# Patient Record
Sex: Female | Born: 1950
Health system: Southern US, Community
[De-identification: ages and names within clinical notes are randomized; demographics above are authoritative.]

## PROBLEM LIST (undated history)

## (undated) DIAGNOSIS — N951 Menopausal and female climacteric states: Secondary | ICD-10-CM

## (undated) DIAGNOSIS — K219 Gastro-esophageal reflux disease without esophagitis: Secondary | ICD-10-CM

## (undated) DIAGNOSIS — H269 Unspecified cataract: Secondary | ICD-10-CM

## (undated) DIAGNOSIS — C439 Malignant melanoma of skin, unspecified: Secondary | ICD-10-CM

## (undated) DIAGNOSIS — R42 Dizziness and giddiness: Secondary | ICD-10-CM

## (undated) DIAGNOSIS — M545 Low back pain, unspecified: Secondary | ICD-10-CM

## (undated) DIAGNOSIS — M1990 Unspecified osteoarthritis, unspecified site: Secondary | ICD-10-CM

## (undated) DIAGNOSIS — J45909 Unspecified asthma, uncomplicated: Secondary | ICD-10-CM

## (undated) DIAGNOSIS — I10 Essential (primary) hypertension: Secondary | ICD-10-CM

## (undated) HISTORY — PX: CHOLECYSTECTOMY: SHX55

## (undated) HISTORY — DX: Essential (primary) hypertension: I10

## (undated) HISTORY — DX: Low back pain, unspecified: M54.50

## (undated) HISTORY — DX: Gastro-esophageal reflux disease without esophagitis: K21.9

## (undated) HISTORY — DX: Unspecified osteoarthritis, unspecified site: M19.90

## (undated) HISTORY — DX: Unspecified cataract: H26.9

## (undated) HISTORY — DX: Low back pain: M54.5

## (undated) HISTORY — PX: TONSILLECTOMY: SUR1361

## (undated) HISTORY — DX: Menopausal and female climacteric states: N95.1

## (undated) HISTORY — PX: BREAST EXCISIONAL BIOPSY: SUR124

## (undated) HISTORY — PX: TUBAL LIGATION: SHX77

## (undated) HISTORY — PX: DILATION AND CURETTAGE OF UTERUS: SHX78

## (undated) HISTORY — DX: Dizziness and giddiness: R42

## (undated) HISTORY — DX: Malignant melanoma of skin, unspecified: C43.9

## (undated) HISTORY — DX: Unspecified asthma, uncomplicated: J45.909

---

## 1999-05-08 ENCOUNTER — Encounter (INDEPENDENT_AMBULATORY_CARE_PROVIDER_SITE_OTHER): Payer: Self-pay | Admitting: Specialist

## 1999-05-08 ENCOUNTER — Encounter: Payer: Self-pay | Admitting: *Deleted

## 1999-05-08 ENCOUNTER — Observation Stay (HOSPITAL_COMMUNITY): Admission: RE | Admit: 1999-05-08 | Discharge: 1999-05-09 | Payer: Self-pay | Admitting: *Deleted

## 1999-11-13 ENCOUNTER — Encounter (INDEPENDENT_AMBULATORY_CARE_PROVIDER_SITE_OTHER): Payer: Self-pay

## 1999-11-13 ENCOUNTER — Other Ambulatory Visit: Admission: RE | Admit: 1999-11-13 | Discharge: 1999-11-13 | Payer: Self-pay | Admitting: Obstetrics and Gynecology

## 2001-04-24 ENCOUNTER — Ambulatory Visit (HOSPITAL_COMMUNITY): Admission: RE | Admit: 2001-04-24 | Discharge: 2001-04-24 | Payer: Self-pay | Admitting: *Deleted

## 2006-04-12 ENCOUNTER — Ambulatory Visit: Payer: Self-pay | Admitting: Internal Medicine

## 2007-05-13 DIAGNOSIS — M545 Low back pain: Secondary | ICD-10-CM | POA: Insufficient documentation

## 2008-12-02 LAB — CONVERTED CEMR LAB: Pap Smear: NORMAL

## 2009-10-04 HISTORY — PX: COLONOSCOPY: SHX174

## 2009-11-04 ENCOUNTER — Encounter: Payer: Self-pay | Admitting: Internal Medicine

## 2009-11-04 LAB — CONVERTED CEMR LAB: Pap Smear: NORMAL

## 2010-06-02 ENCOUNTER — Ambulatory Visit: Payer: Self-pay | Admitting: Internal Medicine

## 2010-06-02 DIAGNOSIS — R42 Dizziness and giddiness: Secondary | ICD-10-CM

## 2010-06-02 DIAGNOSIS — N951 Menopausal and female climacteric states: Secondary | ICD-10-CM

## 2010-06-04 ENCOUNTER — Encounter (INDEPENDENT_AMBULATORY_CARE_PROVIDER_SITE_OTHER): Payer: Self-pay | Admitting: *Deleted

## 2010-06-10 ENCOUNTER — Ambulatory Visit: Payer: Self-pay | Admitting: Internal Medicine

## 2010-06-10 LAB — CONVERTED CEMR LAB
ALT: 21 units/L (ref 0–35)
AST: 22 units/L (ref 0–37)
Albumin: 4.1 g/dL (ref 3.5–5.2)
Alkaline Phosphatase: 84 units/L (ref 39–117)
BUN: 14 mg/dL (ref 6–23)
Basophils Absolute: 0 10*3/uL (ref 0.0–0.1)
Basophils Relative: 0.9 % (ref 0.0–3.0)
Bilirubin Urine: NEGATIVE
Bilirubin, Direct: 0.1 mg/dL (ref 0.0–0.3)
CO2: 30 meq/L (ref 19–32)
Calcium: 9.1 mg/dL (ref 8.4–10.5)
Chloride: 102 meq/L (ref 96–112)
Cholesterol: 199 mg/dL (ref 0–200)
Cortisol, Plasma: 6.9 ug/dL
Creatinine, Ser: 0.7 mg/dL (ref 0.4–1.2)
Eosinophils Absolute: 0.2 10*3/uL (ref 0.0–0.7)
Eosinophils Relative: 4.3 % (ref 0.0–5.0)
GFR calc non Af Amer: 92.01 mL/min (ref >60)
Glucose, Bld: 80 mg/dL (ref 70–99)
HCT: 36.4 % (ref 36.0–46.0)
HDL: 37.5 mg/dL — ABNORMAL LOW (ref >39.00)
Hemoglobin, Urine: NEGATIVE
Hemoglobin: 12.7 g/dL (ref 12.0–15.0)
Ketones, ur: NEGATIVE mg/dL
LDL Cholesterol: 138 mg/dL — ABNORMAL HIGH (ref 0–99)
Leukocytes, UA: NEGATIVE
Lymphocytes Relative: 30 % (ref 12.0–46.0)
Lymphs Abs: 1.4 10*3/uL (ref 0.7–4.0)
MCHC: 35 g/dL (ref 30.0–36.0)
MCV: 89.3 fL (ref 78.0–100.0)
Monocytes Absolute: 0.3 10*3/uL (ref 0.1–1.0)
Monocytes Relative: 6.1 % (ref 3.0–12.0)
Neutro Abs: 2.7 10*3/uL (ref 1.4–7.7)
Neutrophils Relative %: 58.7 % (ref 43.0–77.0)
Nitrite: NEGATIVE
Platelets: 229 10*3/uL (ref 150.0–400.0)
Potassium: 4.6 meq/L (ref 3.5–5.1)
RBC: 4.08 M/uL (ref 3.87–5.11)
RDW: 13.1 % (ref 11.5–14.6)
Sodium: 140 meq/L (ref 135–145)
Specific Gravity, Urine: 1.015 (ref 1.000–1.030)
TSH: 2.05 microintl units/mL (ref 0.35–5.50)
Total Bilirubin: 0.6 mg/dL (ref 0.3–1.2)
Total CHOL/HDL Ratio: 5
Total Protein, Urine: NEGATIVE mg/dL
Total Protein: 6.6 g/dL (ref 6.0–8.3)
Triglycerides: 120 mg/dL (ref 0.0–149.0)
Urine Glucose: NEGATIVE mg/dL
Urobilinogen, UA: 0.2 (ref 0.0–1.0)
VLDL: 24 mg/dL (ref 0.0–40.0)
Vit D, 25-Hydroxy: 41 ng/mL (ref 30–89)
Vitamin B-12: 616 pg/mL (ref 211–911)
WBC: 4.6 10*3/uL (ref 4.5–10.5)
pH: 5.5 (ref 5.0–8.0)

## 2010-06-17 ENCOUNTER — Encounter: Payer: Self-pay | Admitting: Internal Medicine

## 2010-06-17 ENCOUNTER — Ambulatory Visit: Payer: Self-pay | Admitting: Internal Medicine

## 2010-07-13 ENCOUNTER — Encounter (INDEPENDENT_AMBULATORY_CARE_PROVIDER_SITE_OTHER): Payer: Self-pay

## 2010-07-14 ENCOUNTER — Ambulatory Visit: Payer: Self-pay | Admitting: Internal Medicine

## 2010-07-28 ENCOUNTER — Ambulatory Visit: Payer: Self-pay | Admitting: Internal Medicine

## 2010-07-28 LAB — HM COLONOSCOPY

## 2010-07-29 ENCOUNTER — Encounter: Payer: Self-pay | Admitting: Internal Medicine

## 2010-11-05 NOTE — Assessment & Plan Note (Signed)
Summary: last ov 2007/f/u appt/#/cd   Vital Signs:  Patient profile:   60 year old female Weight:      150 pounds (68.18 kg) O2 Sat:      97 % on Room air Temp:     98.3 degrees F (36.83 degrees C) oral Pulse rate:   84 / minute BP supine:   120 / 70  (left arm) BP sitting:   104 / 70  (left arm) BP standing:   110 / 68  (left arm) Cuff size:   regular  Vitals Entered By: Lucious Groves CMA (June 02, 2010 10:32 AM)  O2 Flow:  Room air CC: OV--last seen in 2007./kb Is Patient Diabetic? No Pain Assessment Patient in pain? no      Comments Patient notes that she takes medication for GERD, but did not know the name./kb   CC:  OV--last seen in 2007./kb.  History of Present Illness: The patient presents for a preventive health examination (new)    Preventive Screening-Counseling & Management  Alcohol-Tobacco     Smoking Status: quit  Caffeine-Diet-Exercise     Does Patient Exercise: no  Current Medications (verified): 1)  Lexapro 10 Mg  Tabs (Escitalopram Oxalate) .... 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 Former Smoker Alcohol use-yes Regular exercise-no Does Patient Exercise:  no  Review of Systems  The patient denies anorexia, fever, weight loss, weight gain, vision loss, decreased hearing, hoarseness, chest pain, syncope, dyspnea on exertion, peripheral edema, prolonged cough, headaches, hemoptysis, abdominal pain, melena, hematochezia, severe indigestion/heartburn, hematuria, incontinence, genital sores, muscle weakness, suspicious  skin lesions, transient blindness, difficulty walking, depression, unusual weight change, abnormal bleeding, enlarged lymph nodes, angioedema, and breast masses.         occasional LBP  Physical Exam  General:  Well-developed,well-nourished,in no acute distress; alert,appropriate and cooperative throughout examination Head:  Normocephalic and atraumatic without obvious abnormalities. No apparent alopecia or balding. Eyes:  No corneal or conjunctival inflammation noted. EOMI. Perrla.  Ears:  External ear exam shows no significant lesions or deformities.  Otoscopic examination reveals clear canals, tympanic membranes are intact bilaterally without bulging, retraction, inflammation or discharge. Hearing is grossly normal bilaterally. Nose:  External nasal examination shows no deformity or inflammation. Nasal mucosa are pink and moist without lesions or exudates. Mouth:  Oral mucosa and oropharynx without lesions or exudates.  Teeth in good repair. Neck:  No deformities, masses, or tenderness noted. Lungs:  Normal respiratory effort, chest expands symmetrically. Lungs are clear to auscultation, no crackles or wheezes. Heart:  Normal rate and regular rhythm. S1 and S2 normal without gallop, murmur, click, rub or other extra sounds. Abdomen:  Bowel sounds positive,abdomen soft and non-tender without masses, organomegaly or hernias noted. Msk:  No deformity or scoliosis noted of thoracic or lumbar spine.   Pulses:  R and L carotid,radial,femoral,dorsalis pedis and posterior tibial pulses are full and equal bilaterally Neurologic:  ataxic Skin:  Intact without suspicious lesions or rashes Cervical Nodes:  No lymphadenopathy noted Inguinal Nodes:  No significant adenopathy Psych:  Cognition and judgment appear intact. Alert and cooperative with normal  attention span and concentration. No apparent delusions, illusions, hallucinations   Impression & Recommendations:  Problem # 1:  Preventive Health  Care (ICD-V70.0) PAP 2010 mammo is pending  Health and age related issues were discussed. Available screening tests and vaccinations were discussed as well. Healthy life style including good diet and exercise was discussed.  Labs ordered Needs Colon, BDS  Problem # 2:  LOW BACK PAIN (ICD-724.2) MSK Assessment: Comment Only Mild symptoms Use OTC meds  Complete Medication List: 1)  Lexapro 10 Mg Tabs (Escitalopram oxalate) .... 1/2 once daily 2)  Vitamin D 1000 Unit Tabs (Cholecalciferol) .[name].. 1 by mouth qd  Other Orders: T-Bone Densitometry [phone]) Gastroenterology Referral (GI) Admin 1st Vaccine (60454) Flu Vaccine 16yrs + [phone])  Patient Instructions: 1)  Labs tomorrow v70.0 2)  BMP prior to visit, ICD-9: 3)  Hepatic Panel prior to visit, ICD-9: 4)  Lipid Panel prior to visit, ICD-9: 5)  TSH prior to visit, ICD-9: 6)  CBC w/ Diff prior to visit, ICD-9: 7)  Urine-dip prior to visit, ICD-9: 8)  Vit D 724.20 9)  Vit B12 780.79 10)  cortisol 11)  Please schedule a follow-up appointment in 3 months. 12)  Drink more water 13)  Balance exercises  Preventive Care Screening  Last Tetanus Booster:    Date:  06/04/2009    Results:  Historical   Pap Smear:    Date:  12/02/2008    Results:  normal            Flu Vaccine Consent Questions     Do you have a history of severe allergic reactions to this vaccine? no    Any prior history of allergic reactions to egg and/or gelatin? no    Do you have a sensitivity to the preservative Thimersol? no    Do you have a past history of Guillan-Barre Syndrome? no    Do you currently have an acute febrile illness? no    Have you ever had a severe reaction to latex? no    Vaccine information given and explained to patient? yes    Are you currently pregnant? no    Lot Number:[id number]   Exp Date:04/03/2011   Site Given  Left Deltoid IMlu

## 2010-11-05 NOTE — Procedures (Signed)
Summary: Colonoscopy  Patient: [name] Note: All result statuses are Final unless otherwise noted.  Tests: (1) Colonoscopy (COL)   COL Colonoscopy           DONE     [facility]     [address]     [place], [place]  [address]           COLONOSCOPY PROCEDURE REPORT           PATIENT:  Tina Bird, Tina Bird  MR#:  [id number]     BIRTHDATE:  [birth year], 58 yrs. old  GENDER:  female     ENDOSCOPIST:  Iva Boop, MD, [facility]     REF. BY:  Linda Hedges. Plotnikov, M.D.     PROCEDURE DATE:  07/28/2010     PROCEDURE:  Colonoscopy with snare polypectomy     ASA CLASS:  Class II     INDICATIONS:  Routine Risk Screening     MEDICATIONS:   Fentanyl 100 mcg IV, Versed 7 mg IV           DESCRIPTION OF PROCEDURE:   After the risks benefits and     alternatives of the procedure were thoroughly explained, informed     consent was obtained.  Digital rectal exam was performed and     revealed perianal skin tags and no rectal masses.  Small tags. The     LB [id number] endoscope was introduced through the anus and     advanced to the cecum, which was identified by both the appendix     and ileocecal valve, limited by stenosis.  Stenotic sigmoid, due     to diverticulosis.  The quality of the prep was excellent, using     MoviPrep.  The instrument was then slowly withdrawn as the colon     was fully examined. Insertion: 11:11 minutes Withdrawal: 11:20     minutes     <<PROCEDUREIMAGES>>           FINDINGS:  Two polyps were found in the sigmoid colon. They were     diminutive. Polyps were snared without cautery. Retrieval was     successful.  Severe diverticulosis was found in the sigmoid colon.     Angulation and luminal stenosis - severe.  This was otherwise a     normal examination of the colon.   Retroflexed views in the rectum     revealed internal and external hemorrhoids.    The scope was then     withdrawn from the patient and the procedure completed.           COMPLICATIONS:   None     ENDOSCOPIC IMPRESSION:     1) Two diminutive sigmoid polyps removed     2) Severe sigmoid diverticulosis with luminal stenosis     3) Internal and external hemorrhoids - small     4) Otherwise normal with excellent prep     RECOMMENDATIONS:     Would see a surgeon re: symptomatic hemorrhoids, if desired.     [facility])           REPEAT EXAM:  In for Colonoscopy, pending biopsy results. Use     pediatric scope next exam.           Iva Boop, MD, [facility]           CC:  Linda Hedges. Plotnikov, MD     The Patient  n.     eSIGNED:   Iva Boop at 07/28/2010 12:07 PM           Lynelle Doctor, [phone]  Note: An exclamation mark (!) indicates a result that was not dispersed into the flowsheet. Document Creation Date: 07/28/2010 12:08 PM _______________________________________________________________________  (1) Order result status: Final Collection or observation date-time: 07/28/2010 11:58 Requested date-time:  Receipt date-time:  Reported date-time:  Referring Physician:   Ordering Physician: Stan Head [phone]) Specimen Source:  Source: Launa Grill Order Number: [id number] Lab site:   Appended Document: Colonoscopy   Colonoscopy  Procedure date:  07/28/2010  Findings:       1) Two diminutive sigmoid polyps removed HYPERPLASTIC     2) Severe sigmoid diverticulosis with luminal stenosis     3) Internal and external hemorrhoids - small     4) Otherwise normal with excellent prep  Comments:      Repeat colonoscopy in 10 years.    Procedures Next Due Date:    Colonoscopy: 08/2020   Appended Document: Colonoscopy     Procedures Next Due Date:    Colonoscopy: 08/2020

## 2010-11-05 NOTE — Letter (Signed)
Summary: Pre Visit Letter Revised  [facility]  [address] [place], [place] [address]   Phone: [phone]  Fax: [phone]        06/04/2010 MRN: [id number] Tina Bird [address] [place], [place]  [address]             Procedure Date:  07-28-10   Welcome to the Gastroenterology Division at [facility].    You are scheduled to see a nurse for your pre-procedure visit on 07-14-10 at 10:30a.m. on the 3rd floor at [facility], [address]  We ask that you try to arrive at our office 15 minutes prior to your appointment time to allow for check-in.  Please take a minute to review the attached form.  If you answer "Yes" to one or more of the questions on the first page, we ask that you call the person listed at your earliest opportunity.  If you answer "No" to all of the questions, please complete the rest of the form and bring it to your appointment.    Your nurse visit will consist of discussing your medical and surgical history, your immediate family medical history, and your medications.   If you are unable to list all of your medications on the form, please bring the medication bottles to your appointment and we will list them.  We will need to be aware of both prescribed and over the counter drugs.  We will need to know exact dosage information as well.    Please be prepared to read and sign documents such as consent forms, a financial agreement, and acknowledgement forms.  If necessary, and with your consent, a friend or relative is welcome to sit-in on the nurse visit with you.  Please bring your insurance card so that we may make a copy of it.  If your insurance requires a referral to see a specialist, please bring your referral form from your primary care physician.  No co-pay is required for this nurse visit.     If you cannot keep your appointment, please call [phone] to cancel or reschedule prior to your appointment date.  This allows  [place] the opportunity to schedule an appointment for another patient in need of care.    Thank you for choosing [facility] for your medical needs.  We appreciate the opportunity to care for you.  Please visit [place] at our website  to learn more about our practice.  Sincerely, The Gastroenterology Division

## 2010-11-05 NOTE — Miscellaneous (Signed)
Summary: BONE DENSITY  Clinical Lists Changes  Orders: Added new Test order of T-Lumbar Vertebral Assessment (77082) - Signed 

## 2010-11-05 NOTE — Miscellaneous (Signed)
Summary: Lec previsit  Clinical Lists Changes  Medications: Added new medication of MOVIPREP 100 GM  SOLR (PEG-KCL-NACL-NASULF-NA ASC-C) As per prep instructions. - Signed Rx of MOVIPREP 100 GM  SOLR (PEG-KCL-NACL-NASULF-NA ASC-C) As per prep instructions.;  #1 x 0;  Signed;  Entered by: Ulis Rias RN;  Authorized by: Iva Boop MD, FACG;  Method used: Electronically to [facility] [address] [id number]*, [address] [place], [place], [place]  [address], Ph: [phone] or [phone], Fax: [phone] Observations: Added new observation of NKA: T (07/14/2010 10:26)    Prescriptions: MOVIPREP 100 GM  SOLR (PEG-KCL-NACL-NASULF-NA ASC-C) As per prep instructions.  #1 x 0   Entered by:   Ulis Rias RN   Authorized by:   Iva Boop MD, [facility]   Signed by:   Ulis Rias RN on 07/14/2010   Method used:   Electronically to        [facility] [address] [id number] (retail)       [address]       [place], [place]  [address]       Ph: [phone] or [phone]       Fax: [phone]   RxID:   [id number]

## 2010-11-05 NOTE — Letter (Signed)
Summary: [facility] Instructions  [facility]  [address] [place], [place] [address]   Phone: [phone]  Fax: [phone]       ADREAM PARZYCH    1951/07/20    MRN: [id number]        Procedure Day /Date: Tuesday 07-28-10     Arrival Time: 9:30 am     Procedure Time: 10:30 am     Location of Procedure:                     [facility] (4th Floor)                        PREPARATION FOR COLONOSCOPY WITH MOVIPREP   Starting 5 days prior to your procedure  07-23-10 do not eat nuts, seeds, popcorn, corn, beans, peas,  salads, or any raw vegetables.  Do not take any fiber supplements (e.g. Metamucil, Citrucel, and Benefiber).  THE DAY BEFORE YOUR PROCEDURE         DATE:  07-27-10   DAY:  Monday  1.  Drink clear liquids the entire day-NO SOLID FOOD  2.  Do not drink anything colored red or purple.  Avoid juices with pulp.  No orange juice.  3.  Drink at least 64 oz. (8 glasses) of fluid/clear liquids during the day to prevent dehydration and help the prep work efficiently.  CLEAR LIQUIDS INCLUDE: Water Jello Ice Popsicles Tea (sugar ok, no milk/cream) Powdered fruit flavored drinks Coffee (sugar ok, no milk/cream) Gatorade Juice: apple, white grape, white cranberry  Lemonade Clear bullion, consomm, broth Carbonated beverages (any kind) Strained chicken noodle soup Hard Candy                             4.  In the morning, mix first dose of MoviPrep solution:    Empty 1 Pouch A and 1 Pouch B into the disposable container    Add lukewarm drinking water to the top line of the container. Mix to dissolve    Refrigerate (mixed solution should be used within 24 hrs)  5.  Begin drinking the prep at 5:00 p.m. The MoviPrep container is divided by 4 marks.   Every 15 minutes drink the solution down to the next mark (approximately 8 oz) until the full liter is complete.   6.  Follow completed prep with 16 oz of clear liquid of your choice (Nothing  red or purple).  Continue to drink clear liquids until bedtime.  7.  Before going to bed, mix second dose of MoviPrep solution:    Empty 1 Pouch A and 1 Pouch B into the disposable container    Add lukewarm drinking water to the top line of the container. Mix to dissolve    Refrigerate  THE DAY OF YOUR PROCEDURE      DATE:  07-28-10  DAY:  Tuesday  Beginning at  5:30 a.m. (5 hours before procedure):         1. Every 15 minutes, drink the solution down to the next mark (approx 8 oz) until the full liter is complete.  2. Follow completed prep with 16 oz. of clear liquid of your choice.    3. You may drink clear liquids until  8:30 a.m.  (2 HOURS BEFORE PROCEDURE).   MEDICATION INSTRUCTIONS  Unless otherwise instructed, you should take regular prescription medications with a small sip of water  as early as possible the morning of your procedure.        OTHER INSTRUCTIONS  You will need a responsible adult at least 62 years of age to accompany you and drive you home.   This person must remain in the waiting room during your procedure.  Wear loose fitting clothing that is easily removed.  Leave jewelry and other valuables at home.  However, you may wish to bring a book to read or  an iPod/MP3 player to listen to music as you wait for your procedure to start.  Remove all body piercing jewelry and leave at home.  Total time from sign-in until discharge is approximately 2-3 hours.  You should go home directly after your procedure and rest.  You can resume normal activities the  day after your procedure.  The day of your procedure you should not:   Drive   Make legal decisions   Operate machinery   Drink alcohol   Return to work  You will receive specific instructions about eating, activities and medications before you leave.    The above instructions have been reviewed and explained to me by   Ulis Rias RN  July 14, 2010 11:05 AM     I fully  understand and can verbalize these instructions _____________________________ Date _________

## 2010-11-05 NOTE — Letter (Signed)
Summary: Patient Notice-Hyperplastic Polyps  [facility]  [address] [place], [place] [address]   Phone: [phone]  Fax: [phone]        July 29, 2010 MRN: [id number]    MALAYJA FREUND [address] [place], [place]  [address]    Dear Ms. Delford Field,  I am pleased to inform you that the colon polyp(s) removed during your recent colonoscopy was (were) found to be hyperplastic.  These types of polyps are NOT pre-cancerous.  It is therefore my recommendation that you have a repeat colonoscopy examination in 10 years for routine colorectal cancer screening.  Should you develop new or worsening symptoms of abdominal pain, bowel habit changes or bleeding from the rectum or bowels, please schedule an evaluation with either your primary care physician or with me.   Please call us if you are having persistent problems or have questions about your condition that have not been fully answered at this time.  Sincerely,  Iva Boop MD, [facility] This letter has been electronically signed by your physician.  Appended Document: Patient Notice-Hyperplastic Polyps letter mailed 10.28.11

## 2010-11-17 ENCOUNTER — Telehealth: Payer: Self-pay | Admitting: Internal Medicine

## 2010-11-20 ENCOUNTER — Encounter: Payer: Self-pay | Admitting: Internal Medicine

## 2010-11-20 ENCOUNTER — Ambulatory Visit (INDEPENDENT_AMBULATORY_CARE_PROVIDER_SITE_OTHER): Payer: 59 | Admitting: Internal Medicine

## 2010-11-20 DIAGNOSIS — R42 Dizziness and giddiness: Secondary | ICD-10-CM

## 2010-11-25 NOTE — Progress Notes (Signed)
Summary: ENT  Phone Note Call from Patient Call back at Home Phone [phone]   Summary of Call: Patient is requesting reccomendation from Dr Posey Rea for ENT. She is c/o vertigo.  Initial call taken by: Lamar Sprinkles, CMA,  November 17, 2010 1:17 PM  Follow-up for Phone Call        Dr Pollyann Kennedy Dx vertigo Follow-up by: Tresa Garter MD,  November 17, 2010 2:08 PM  Additional Follow-up for Phone Call Additional follow up Details #1::        left detailed vm for pt Additional Follow-up by: Lamar Sprinkles, CMA,  November 17, 2010 3:16 PM

## 2010-12-01 NOTE — Assessment & Plan Note (Signed)
Summary: ??inner ear infection---plot pt   Vital Signs:  Patient profile:   60 year old female Height:      66 inches Weight:      150.25 pounds BMI:     24.34 O2 Sat:      99 % on Room air Temp:     98.0 degrees F oral Pulse rate:   78 / minute Resp:     16 per minute BP sitting:   120 / 82  (left arm)  Vitals Entered By: Burnard Leigh CMA(AAMA) (November 20, 2010 12:12 PM)  O2 Flow:  Room air CC: Pt c/o excessive vertigo, nausea & vomiting, sinus pressure and pain in Left ear x1 week/sls,cma Is Patient Diabetic? No   Primary Care Provider:  Tresa Garter Bird  CC:  Pt c/o excessive vertigo, nausea & vomiting, sinus pressure and pain in Left ear x1 week/sls, and cma.  History of Present Illness: Tina Bird presents for an exacerbation of "vertigo."  She reports that she has had a problem with intermittent dysquilibrium for years. Her symptoms will wax and wane but are fairly constant. She reports that she has never had a neuro consult or neuro-imaging. She has no focal neurologic symptoms. On detailed questioning she does admit to a sense of being off balance, like being on a ship in high seas. She denies diploplia, paresthesias, weakness. she has no hearing loss, no history of trauma/concussion.  Current Medications (verified): 1)  Lexapro 10 Mg  Tabs (Escitalopram Oxalate) .... 1/2 Once Daily 2)  Vitamin D 1000 Unit Tabs (Cholecalciferol) .[name].. 1 By Mouth Qd 3)  Sudafed 30 Mg Tabs (Pseudoephedrine Hcl) .... 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   Neuro:  Complains of poor balance and sensation of room spinning; denies brief paralysis, difficulty with concentration, disturbances in coordination, falling down, headaches, inability to speak, memory loss, numbness, seizures, tingling, tremors, visual disturbances, and weakness.  Physical Exam  General:  Well-developed,well-nourished,in no acute distress; alert,appropriate and cooperative throughout examination Head:  normocephalic and atraumatic.   Eyes:  vision grossly intact, pupils equal, and pupils round.   Neck:  supple.   Lungs:  normal respiratory effort.   Heart:  normal rate and regular rhythm.   Msk:  normal ROM.   Pulses:  2+ radial Neurologic:  alert & oriented X3, cranial nerves II-XII intact, strength normal in all extremities, gait normal, DTRs symmetrical and normal, finger-to-nose normal, heel-to-shin normal, and Romberg negative.   Skin:  turgor normal and color normal.   Psych:  Oriented X3, memory intact for recent and remote, and normally interactive.     Impression & Recommendations:  Problem # 1:  DIZZINESS (ICD-780.4) By history and based on a negative neuro exam her symptoms are c/w chronic labyrinthitis. Explanation provided illustrated with the use of anatomy text.  Plan- chronic use meclizine 12.5 mg two times a day with increase as needed based on symptoms.          for lack of response or worsening symptoms neuro-imagin should be considered.  Her updated medication list for this problem includes:    Meclizine Hcl 25 Mg Tabs (Meclizine hcl) .[name]... 1/2 tsbliet two times a day routinely. increase to 1 tablet  three times a day as needed acute flares of labyrinthitis.  Complete Medication List: 1)  Lexapro 10 Mg Tabs (Escitalopram oxalate) .... 1/2 once daily 2)  Vitamin D 1000 Unit Tabs (Cholecalciferol) .[name].. 1 by mouth qd 3)  Sudafed 30 Mg Tabs (Pseudoephedrine hcl) .... Take 1 as needed 4)  Meclizine Hcl 25 Mg Tabs (Meclizine hcl) .... 1/2 tsbliet two times a day routinely. increase to 1 tablet three times a day as needed acute flares of labyrinthitis. Prescriptions: MECLIZINE HCL 25 MG TABS (MECLIZINE HCL) 1/2 tsbliet two times a day routinely. Increase to 1 tablet three times a day as needed acute flares of labyrinthitis.  #100 x 3   Entered and Authorized by:   Jacques Navy Bird   Signed by:   Jacques Navy Bird on 11/20/2010   Method used:   Print then Give to Patient   RxID:   [id number]    Orders Added: 1)  Est. Patient Level III [76160]     Preventive Care Screening  Pap Smear:    Date:  11/04/2009    Results:  normal

## 2010-12-02 ENCOUNTER — Encounter: Payer: Self-pay | Admitting: Internal Medicine

## 2010-12-02 ENCOUNTER — Ambulatory Visit (INDEPENDENT_AMBULATORY_CARE_PROVIDER_SITE_OTHER)
Admission: RE | Admit: 2010-12-02 | Discharge: 2010-12-02 | Disposition: A | Discharge status: Home / Self Care | Payer: 59 | Source: Ambulatory Visit | Attending: Internal Medicine | Admitting: Internal Medicine

## 2010-12-02 ENCOUNTER — Ambulatory Visit (INDEPENDENT_AMBULATORY_CARE_PROVIDER_SITE_OTHER): Payer: 59 | Admitting: Internal Medicine

## 2010-12-02 ENCOUNTER — Other Ambulatory Visit: Payer: Self-pay | Admitting: Internal Medicine

## 2010-12-02 DIAGNOSIS — M25559 Pain in unspecified hip: Secondary | ICD-10-CM | POA: Insufficient documentation

## 2010-12-02 DIAGNOSIS — R42 Dizziness and giddiness: Secondary | ICD-10-CM

## 2010-12-02 DIAGNOSIS — M545 Low back pain: Secondary | ICD-10-CM

## 2010-12-07 ENCOUNTER — Telehealth: Payer: Self-pay | Admitting: Internal Medicine

## 2010-12-15 NOTE — Assessment & Plan Note (Signed)
Summary: f/u appt   Vital Signs:  Patient profile:   60 year old female Height:      66 inches Weight:      150 pounds BMI:     24.30 Temp:     98.9 degrees F oral Pulse rate:   88 / minute Pulse rhythm:   regular Resp:     16 per minute BP sitting:   118 / 76  (left arm) Cuff size:   regular  Vitals Entered By: Lanier Prude, CMA(AAMA) (December 02, 2010 3:01 PM) CC: f/u  Is Patient Diabetic? No   Primary Care Provider:  Tresa Garter MD  CC:  f/u .  History of Present Illness: C/o LBP and B hip pains/leg pains x long time, every day. F/u depression/anxiety  Current Medications (verified): 1)  Lexapro 10 Mg  Tabs (Escitalopram Oxalate) .... 1/2 Once Daily 2)  Vitamin D 1000 Unit Tabs (Cholecalciferol) .[name].. 1 By Mouth Qd 3)  Sudafed 30 Mg Tabs (Pseudoephedrine Hcl) .... Take 1 As Needed 4)  Meclizine Hcl 25 Mg Tabs (Meclizine Hcl) .... 1/2 Tsbliet Two Times A Day Routinely. Increase To 1 Tablet Three Times A Day As Needed Acute Flares of Labyrinthitis.  Allergies (verified): No Known Drug Allergies  Past History:  Past Medical History: Last updated: 06/02/2010 Low back pain VERTIGO MEOPAUSAL SWINGS  Dr Elana Alm MELANOMA  RIGHT LEG AND RIGHT BREAST  Social History: Last updated: 06/02/2010 Occupation: Control and instrumentation engineer Married 1 child Former Smoker Alcohol use-yes Regular exercise-no  Physical Exam  General:  Well-developed,well-nourished,in no acute distress; alert,appropriate and cooperative throughout examination Mouth:  Oral mucosa and oropharynx without lesions or exudates.  Teeth in good repair. Neck:  supple.   Lungs:  normal respiratory effort.   Heart:  normal rate and regular rhythm.   Abdomen:  Bowel sounds positive,abdomen soft and non-tender without masses, organomegaly or hernias noted. Msk:  LS w/normal ROM.   Neurologic:  alert & oriented X3, cranial nerves II-XII intact, strength normal in all extremities, gait normal, DTRs  symmetrical and normal, finger-to-nose normal, heel-to-shin normal, and Romberg negative.   Skin:  turgor normal and color normal.   Psych:  Oriented X3, memory intact for recent and remote, and normally interactive.     Impression & Recommendations:  Problem # 1:  LOW BACK PAIN (ICD-724.2) MSK Assessment Deteriorated See "Patient Instructions".  Her updated medication list for this problem includes:    Ibuprofen 600 Mg Tabs (Ibuprofen) .[name]... 1 by mouth bid  pc x 1 wk then as needed for  pain  Problem # 2:  HIP PAIN (ICD-719.45) B troch bursitis Assessment: New  Her updated medication list for this problem includes:    Ibuprofen 600 Mg Tabs (Ibuprofen) .[name]... 1 by mouth bid  pc x 1 wk then as needed for  pain We can inject if needed w/seroid Orders: T-Pelvis 1or 2 views (72170TC)  Problem # 3:  DIZZINESS (ICD-780.4) due to BPV Assessment: New  Her updated medication list for this problem includes:    Meclizine Hcl 25 Mg Tabs (Meclizine hcl) .[name]... 1/2 tsbliet two times a day routinely. increase to 1 tablet three times a day as needed acute flares of labyrinthitis.  Complete Medication List: 1)  Lexapro 10 Mg Tabs (Escitalopram oxalate) .... 1/2 once daily 2)  Vitamin D 1000 Unit Tabs (Cholecalciferol) .[name].. 1 by mouth qd 3)  Sudafed 30 Mg Tabs (Pseudoephedrine hcl) .... Take 1 as needed 4)  Meclizine Hcl 25 Mg Tabs (  Meclizine hcl) .... 1/2 tsbliet two times a day routinely. increase to 1 tablet three times a day as needed acute flares of labyrinthitis. 5)  Ibuprofen 600 Mg Tabs (Ibuprofen) .[name].. 1 by mouth bid  pc x 1 wk then as needed for  pain  Patient Instructions: 1)  Use stretching  exercises that I have provided (15 min. or longer every day)  2)  Go on Youtube (www.youtube.com) and look up "piriformis stretch",  "IT band stretch" and "gluteus stretch". Learn the anatomy and learn the symptoms. Do the stretches - it may help!  3)  Please schedule a follow-up appointment in 3  months. Prescriptions: IBUPROFEN 600 MG TABS (IBUPROFEN) 1 by mouth bid  pc x 1 wk then as needed for  pain  #60 x 3   Entered and Authorized by:   Tresa Garter MD   Signed by:   Tresa Garter MD on 12/02/2010   Method used:   Print then Give to Patient   RxID:   [id number]    Orders Added: 1)  T-Pelvis 1or 2 views [72170TC] 2)  Est. Patient Level IV [86578]

## 2010-12-15 NOTE — Progress Notes (Signed)
Summary: inj ?  Phone Note Call from Patient Call back at Home Phone [phone]   Caller: Patient Summary of Call: pt is interested in hip inj. Did you mean a trigger point inj or can we schedule a nurse visit to give Depo Medrol? Please advise Initial call taken by: Lanier Prude, [facility]),  December 07, 2010 2:33 PM  Follow-up for Phone Call        No - she needs OV w/me for B hip inj Thank you!  Follow-up by: Tresa Garter MD,  December 07, 2010 10:48 PM  Additional Follow-up for Phone Call Additional follow up Details #1::        Pt advised via VM to make OV with AVP for injection Additional Follow-up by: Margaret Pyle, CMA,  December 08, 2010 10:54 AM

## 2011-02-19 NOTE — Assessment & Plan Note (Signed)
[facility]                             PRIMARY CARE OFFICE NOTE   NAME:Tina Bird, Tina Bird                       MRN:          [id number]  DATE:04/12/2006                            DOB:          1950/12/30    The patient is a 60 year old female, a new patient who presents to establish  primarily with me and to address her chronic problems, including low back  pain, vertigo and a history of melanoma.   PAST MEDICAL HISTORY:  1.  Low back pain for years, apparently related to osteoarthritis, treated      by her chiropractor.  Has had x-rays.  2.  Vertigo, chronic and recurrent.  Saw an ENT specialist 7 or 8 years ago.  3.  Menopausal mood swings, treated with Lexapro.  4.  Melanoma, right leg, right breast.  She tells me it is stage I according      to a dermatologist.  Has had it operated on in 2000.   CURRENT MEDICATIONS:  Lexapro 40 mg one-half daily.   SOCIAL HISTORY:  She is married.  She sells furniture.  Stopped smoking 2  years ago.  Alcohol occasionally.  Exercises when time permits.   FAMILY HISTORY:  Her father died at the age of 59 in a motor vehicle  accident.  Mother is living at age of 35, has diabetes and osteoarthritis.  One sister with end-stage kidney disease awaiting transplant.   REVIEW OF SYSTEMS:  Occasional low back pain.  No leg weakness.  Vertigo  several times a week, mild to moderate.  Denies being depressed.  No blood  in her stools.  The rest is negative or as above.   PHYSICAL EXAMINATION:  VITAL SIGNS:  Blood pressure 122/79, pulse 70,  temperature 99.1, weight 145 pounds.  GENERAL:  She looks well.  She is in no acute distress.  HEENT:  Moist mucosa.  NECK:  Supple, no thyromegaly or bruit.  LUNGS:  Clear, no wheeze or rales.  CARDIAC:  S1, S2, no murmur, no gallop.  ABDOMEN:  Soft, nontender, no organomegaly, no mass felt.  EXTREMITIES:  Lower extremities without edema.  NEUROLOGIC:  She is alert and oriented  and cooperative.  Denies being  depressed.  SKIN:  Multiple freckles.  No moles suspicious for melanoma.  BACK:  LS spine without deformities.   ASSESSMENT AND PLAN:  1.  Chronic low back pain.  She has been seeing a [facility].  Use Advil      or Tylenol.  Given Darvocet-N 100 #120 with 3 refills, with one q.i.d.      p.r.n.  Call if problems.  I will see her back in 6 months.  2.  Vertigo, likely chronic recurrent benign positional vertigo.  Given      __________ exercise, meclizine 12.5 mg one to two q.i.d. p.r.n.  No      driving if dizzy.  3.  History of melanoma, likely in situ.  Dermatology check-up every year      plus self-exam.  4.  Menopause.  She is on calcium and vitamin D.  Was on HRT for a short      period of time.  5.  Health maintenance.  She has been seeing Dr. Elana Alm regularly for Pap      smear, mammogram once a year.  Video colonoscopy with Dr. Juanda Chance.  6.  Recurrent occasional herpes simplex in the form of fever blisters.      Acyclovir 400 mg p.o. daily for 5 days p.r.n.  7.  History of smoking.  Quit 2 years ago.  Obtain chest x-ray.                                   Sonda Primes, MD   AP/MedQ  DD:  04/12/2006  DT:  04/12/2006  Job #:  [id number]   cc:   S. Kyra Manges, MD  Hedwig Morton. Juanda Chance, MD

## 2011-03-15 ENCOUNTER — Encounter (INDEPENDENT_AMBULATORY_CARE_PROVIDER_SITE_OTHER): Payer: Self-pay | Admitting: Surgery

## 2011-06-21 ENCOUNTER — Telehealth: Payer: Self-pay | Admitting: *Deleted

## 2011-06-21 NOTE — Telephone Encounter (Signed)
Pt calling requesting results from 11/2010 pelvic xray. I advised her that the exam was negative per the radiologist's interpretation. She is interested in scheduling OV for bilateral hip injections. She states she will call back tom to sched OV.

## 2011-07-22 ENCOUNTER — Encounter: Payer: Self-pay | Admitting: Internal Medicine

## 2011-07-27 ENCOUNTER — Ambulatory Visit (INDEPENDENT_AMBULATORY_CARE_PROVIDER_SITE_OTHER): Payer: 59 | Admitting: Internal Medicine

## 2011-07-27 ENCOUNTER — Encounter: Payer: Self-pay | Admitting: Internal Medicine

## 2011-07-27 VITALS — BP 122/80 | HR 88 | Temp 98.4°F | Resp 16 | Wt 152.0 lb

## 2011-07-27 DIAGNOSIS — M545 Low back pain, unspecified: Secondary | ICD-10-CM

## 2011-07-27 DIAGNOSIS — M1990 Unspecified osteoarthritis, unspecified site: Secondary | ICD-10-CM

## 2011-07-27 DIAGNOSIS — Z23 Encounter for immunization: Secondary | ICD-10-CM

## 2011-07-27 DIAGNOSIS — R42 Dizziness and giddiness: Secondary | ICD-10-CM

## 2011-07-27 HISTORY — DX: Unspecified osteoarthritis, unspecified site: M19.90

## 2011-07-27 MED ORDER — ESCITALOPRAM OXALATE 10 MG PO TABS: 10.0000 mg | ORAL_TABLET | Freq: Every day | ORAL | Status: DC

## 2011-07-27 MED ORDER — ALPRAZOLAM 0.25 MG PO TABS: 0.2500 mg | ORAL_TABLET | Freq: Two times a day (BID) | ORAL | Status: AC

## 2011-07-27 MED ORDER — ACYCLOVIR 400 MG PO TABS: 400.0000 mg | ORAL_TABLET | Freq: Three times a day (TID) | ORAL | Status: DC

## 2011-07-27 NOTE — Assessment & Plan Note (Signed)
On Meclizine prn 

## 2011-07-27 NOTE — Patient Instructions (Signed)
Start yoga  

## 2011-07-27 NOTE — Progress Notes (Signed)
  Subjective:    Patient ID: Tina Bird, female    DOB: 1951-02-05, 60 y.o.   MRN: [id number]  HPI   The patient is here to follow up on chronic dizziness x years (s/p ENT consult long time ago), anxiety, headaches and chronic OA symptoms controlled with medicines, diet and exercise. C/o L knee pain   Review of Systems  Constitutional: Negative for chills, activity change, appetite change, fatigue and unexpected weight change.  HENT: Negative for congestion, mouth sores and sinus pressure.   Eyes: Negative for visual disturbance.  Respiratory: Negative for cough and chest tightness.   Gastrointestinal: Negative for nausea and abdominal pain.  Genitourinary: Negative for frequency, difficulty urinating and vaginal pain.  Musculoskeletal: Positive for arthralgias. Negative for back pain and gait problem.  Skin: Negative for pallor and rash.  Neurological: Positive for dizziness. Negative for tremors, weakness, numbness and headaches.  Psychiatric/Behavioral: Negative for confusion and sleep disturbance.       Objective:   Physical Exam  Constitutional: She appears well-developed and well-nourished. No distress.  HENT:  Head: Normocephalic.  Right Ear: External ear normal.  Left Ear: External ear normal.  Nose: Nose normal.  Mouth/Throat: Oropharynx is clear and moist.  Eyes: Conjunctivae are normal. Pupils are equal, round, and reactive to light. Right eye exhibits no discharge. Left eye exhibits no discharge.  Neck: Normal range of motion. Neck supple. No JVD present. No tracheal deviation present. No thyromegaly present.  Cardiovascular: Normal rate, regular rhythm and normal heart sounds.   Pulmonary/Chest: No stridor. No respiratory distress. She has no wheezes.  Abdominal: Soft. Bowel sounds are normal. She exhibits no distension and no mass. There is no tenderness. There is no rebound and no guarding.  Musculoskeletal: She exhibits no edema and no tenderness.    Lymphadenopathy:    She has no cervical adenopathy.  Neurological: She displays normal reflexes. No cranial nerve deficit. She exhibits normal muscle tone. Coordination normal.  Skin: No rash noted. No erythema.  Psychiatric: She has a normal mood and affect. Her behavior is normal. Judgment and thought content normal.          Assessment & Plan:

## 2011-07-27 NOTE — Assessment & Plan Note (Signed)
L knee , hips, LS

## 2011-07-27 NOTE — Assessment & Plan Note (Signed)
Continue with current prescription therapy as reflected on the Med list.  

## 2011-08-05 ENCOUNTER — Telehealth: Payer: Self-pay | Admitting: Internal Medicine

## 2011-08-05 MED ORDER — CITALOPRAM HYDROBROMIDE 20 MG PO TABS: 20.0000 mg | ORAL_TABLET | Freq: Every day | ORAL | Status: DC

## 2011-08-05 NOTE — Telephone Encounter (Signed)
Please advise 

## 2011-08-05 NOTE — Telephone Encounter (Signed)
Pt notified and rx sent to [facility]

## 2011-08-05 NOTE — Telephone Encounter (Signed)
Addended by: Janeal Holmes on: 08/05/2011 01:21 PM   Modules accepted: Orders, Medications

## 2011-08-05 NOTE — Telephone Encounter (Signed)
The pt called and is requesting a medicine change from Lexapro 10mg  to something cheaper.  The pt stated the medicine is 45.00 and she is unable to pay.    Thanks!

## 2011-08-05 NOTE — Telephone Encounter (Signed)
Addended by: Mervin Kung A on: 08/05/2011 03:28 PM   Modules accepted: Orders

## 2011-08-05 NOTE — Telephone Encounter (Signed)
OK to d/c Citalopram should be $4 at [facility] or [facility] Thx

## 2011-08-09 ENCOUNTER — Other Ambulatory Visit: Payer: Self-pay | Admitting: *Deleted

## 2011-08-09 MED ORDER — CITALOPRAM HYDROBROMIDE 20 MG PO TABS: 20.0000 mg | ORAL_TABLET | Freq: Every day | ORAL | Status: DC

## 2011-08-10 ENCOUNTER — Telehealth: Payer: Self-pay | Admitting: Internal Medicine

## 2011-08-10 NOTE — Telephone Encounter (Signed)
The pt called and stated she needs a refill on a generic lexapro rx.  I dont see where she is taking this, but wanted to send a msg just to make sure.   Thanks!

## 2011-08-10 NOTE — Telephone Encounter (Signed)
FYI:  Pt calling again requesting sub for Lexapro be called in to [facility]. I called medication in yesterday at [facility] on [address] after the pharmacy tech stated pt was not in their system and gave rx by phone on 08-09-11.   07-10-11-4:46pm: I just spoke to pt and advised I personally called med in again yesterday and that it should be ready to be p/u. She states she is upset/frustrated with our office staff because she never received a call back the first time re: this request. I apologized for any inconvenience our office caused and advised pt to check with pharmacy now for med p/u.

## 2011-08-10 NOTE — Telephone Encounter (Signed)
Pt was informed twice Rf for Celexa was phoned in on 07-05-11 and 07-09-11 to [facility].

## 2011-08-11 ENCOUNTER — Ambulatory Visit (INDEPENDENT_AMBULATORY_CARE_PROVIDER_SITE_OTHER): Payer: 59 | Admitting: *Deleted

## 2011-08-11 DIAGNOSIS — Z23 Encounter for immunization: Secondary | ICD-10-CM

## 2011-08-11 DIAGNOSIS — Z2911 Encounter for prophylactic immunotherapy for respiratory syncytial virus (RSV): Secondary | ICD-10-CM

## 2011-12-14 ENCOUNTER — Ambulatory Visit (INDEPENDENT_AMBULATORY_CARE_PROVIDER_SITE_OTHER): Payer: 59 | Admitting: Internal Medicine

## 2011-12-14 ENCOUNTER — Encounter: Payer: Self-pay | Admitting: Internal Medicine

## 2011-12-14 DIAGNOSIS — G8929 Other chronic pain: Secondary | ICD-10-CM

## 2011-12-14 DIAGNOSIS — M25559 Pain in unspecified hip: Secondary | ICD-10-CM

## 2011-12-14 DIAGNOSIS — R42 Dizziness and giddiness: Secondary | ICD-10-CM

## 2011-12-14 DIAGNOSIS — K148 Other diseases of tongue: Secondary | ICD-10-CM | POA: Insufficient documentation

## 2011-12-14 MED ORDER — IBUPROFEN 600 MG PO TABS: 600.0000 mg | ORAL_TABLET | Freq: Four times a day (QID) | ORAL | Status: DC | PRN

## 2011-12-14 NOTE — Assessment & Plan Note (Signed)
Better  

## 2011-12-14 NOTE — Patient Instructions (Signed)
[email or web address] --- hip openers; IT band stretches

## 2011-12-14 NOTE — Assessment & Plan Note (Signed)
Ibuprofen Rx Stretching Ice

## 2011-12-14 NOTE — Assessment & Plan Note (Signed)
magic mouthwash Rx

## 2011-12-14 NOTE — Progress Notes (Signed)
Patient ID: Tina Bird, female   DOB: July 22, 1951, 61 y.o.   MRN: [id number]  Subjective:    Patient ID: Tina Bird, female    DOB: [birth year], 61 y.o.   MRN: [id number]  Hip Pain  Pertinent negatives include no numbness.     C/o L hip  Pain x mo off and on C/o sore tongue tip x 1 wk F/u dizziness - better   Review of Systems  Constitutional: Negative for chills, activity change, appetite change, fatigue and unexpected weight change.  HENT: Negative for congestion, mouth sores and sinus pressure.   Eyes: Negative for visual disturbance.  Respiratory: Negative for cough and chest tightness.   Gastrointestinal: Negative for nausea and abdominal pain.  Genitourinary: Negative for frequency, difficulty urinating and vaginal pain.  Musculoskeletal: Positive for arthralgias. Negative for back pain and gait problem.  Skin: Negative for pallor and rash.  Neurological: Negative for dizziness, tremors, weakness, numbness and headaches.  Psychiatric/Behavioral: Negative for confusion and sleep disturbance.       Objective:   Physical Exam  Constitutional: She appears well-developed and well-nourished. No distress.  HENT:  Head: Normocephalic.  Right Ear: External ear normal.  Left Ear: External ear normal.  Nose: Nose normal.  Mouth/Throat: Oropharynx is clear and moist. No oropharyngeal exudate.       Tongue is clear  Eyes: Conjunctivae are normal. Pupils are equal, round, and reactive to light. Right eye exhibits no discharge. Left eye exhibits no discharge.  Neck: Normal range of motion. Neck supple. No JVD present. No tracheal deviation present. No thyromegaly present.  Cardiovascular: Normal rate, regular rhythm and normal heart sounds.   Pulmonary/Chest: No stridor. No respiratory distress. She has no wheezes.  Abdominal: Soft. Bowel sounds are normal. She exhibits no distension and no mass. There is no tenderness. There is no rebound and no guarding.  Musculoskeletal: She  exhibits no edema and no tenderness.       B hips WNL LS NT  Lymphadenopathy:    She has no cervical adenopathy.  Neurological: She displays normal reflexes. No cranial nerve deficit. She exhibits normal muscle tone. Coordination normal.  Skin: No rash noted. No erythema.  Psychiatric: She has a normal mood and affect. Her behavior is normal. Judgment and thought content normal.     Pelvis x ray WNL     Assessment & Plan:

## 2012-01-12 ENCOUNTER — Telehealth: Payer: Self-pay | Admitting: *Deleted

## 2012-01-12 NOTE — Telephone Encounter (Signed)
Pt left vm c/o cough. She is requesting rx for this. Please advise.

## 2012-01-13 ENCOUNTER — Ambulatory Visit (INDEPENDENT_AMBULATORY_CARE_PROVIDER_SITE_OTHER)
Admission: RE | Admit: 2012-01-13 | Discharge: 2012-01-13 | Disposition: A | Discharge status: Home / Self Care | Payer: 59 | Source: Ambulatory Visit | Attending: Endocrinology | Admitting: Endocrinology

## 2012-01-13 ENCOUNTER — Ambulatory Visit (INDEPENDENT_AMBULATORY_CARE_PROVIDER_SITE_OTHER): Payer: 59 | Admitting: Endocrinology

## 2012-01-13 ENCOUNTER — Encounter: Payer: Self-pay | Admitting: Endocrinology

## 2012-01-13 VITALS — BP 134/82 | HR 70 | Temp 98.2°F | Ht 66.0 in | Wt 147.1 lb

## 2012-01-13 DIAGNOSIS — R05 Cough: Secondary | ICD-10-CM

## 2012-01-13 MED ORDER — BENZONATATE 100 MG PO CAPS: 100.0000 mg | ORAL_CAPSULE | Freq: Three times a day (TID) | ORAL | Status: AC | PRN

## 2012-01-13 NOTE — Patient Instructions (Addendum)
A chest-x-ray is requested for you today.  You will receive a letter with results. i have sent a prescription to your pharmacy, for non-drowsy cough pills. here is a sample of "advair-250."  take 1 puff 2x a day.  rinse mouth after using. I hope you feel better soon.  If you don't feel better by next week, please call back. (see letter)

## 2012-01-13 NOTE — Telephone Encounter (Signed)
Pt is scheduled to see Dr. Everardo All today. Closing phone note.

## 2012-01-13 NOTE — Progress Notes (Signed)
  Subjective:    Patient ID: Tina Bird, female    DOB: 1951-04-28, 61 y.o.   MRN: [id number]  HPI Pt states few weeks of slightly prod-quality cough in the chest, but no assoc wheezing.  She says this started with URI, which is now resolved.   Past Medical History  Diagnosis Date  . LBP (low back pain)   . Vertigo   . Menopausal symptoms     Dr. Elana Alm  . Melanoma     Right leg and Right breast  . Osteoarthritis 07/27/2011    Past Surgical History  Procedure Date  . Cholecystectomy     History   Social History  . Marital Status: Married    Spouse Name: N/A    Number of Children: N/A  . Years of Education: N/A   Occupational History  . Not on file.   Social History Main Topics  . Smoking status: Former Games developer  . Smokeless tobacco: Not on file  . Alcohol Use: No  . Drug Use: No  . Sexually Active:    Other Topics Concern  . Not on file   Social History Narrative   Regular Exercise- no    Current Outpatient Prescriptions on File Prior to Visit  Medication Sig Dispense Refill  . Cholecalciferol 1000 UNITS tablet Take 1,000 Units by mouth daily.        . citalopram (CELEXA) 20 MG tablet Take 1 tablet (20 mg total) by mouth daily.  30 tablet  5  . ibuprofen (ADVIL,MOTRIN) 600 MG tablet Take 1 tablet (600 mg total) by mouth every 6 (six) hours as needed.  60 tablet  3  . meclizine (ANTIVERT) 25 MG tablet Take 12.5 mg by mouth 3 (three) times daily as needed.        . pseudoephedrine (SUDAFED) 30 MG tablet Take 30 mg by mouth every 4 (four) hours as needed.          Allergies  Allergen Reactions  . Allegra     Family History  Problem Relation Age of Onset  . Heart disease Brother   . Hypertension Other   . Alzheimer's disease Other     BP 134/82  Pulse 70  Temp(Src) 98.2 F (36.8 C) (Oral)  Ht 5\' 6"  (1.676 m)  Wt 147 lb 1.9 oz (66.733 kg)  BMI 23.75 kg/m2  SpO2 95%  Review of Systems Denies fever.  Sore throat is resolved    Objective:   Physical Exam VITAL SIGNS:  See vs page GENERAL: no distress head: no deformity eyes: no periorbital swelling, no proptosis external nose and ears are normal mouth: no lesion seen Both eac's and tm's are normal NECK: There is no palpable thyroid enlargement.  No thyroid nodule is palpable.  No palpable lymphadenopathy at the anterior neck. LUNGS:  Clear to auscultation   (i reviewed x-ray results)    Assessment & Plan:  cough after uri, new

## 2012-01-17 ENCOUNTER — Telehealth: Payer: Self-pay | Admitting: *Deleted

## 2012-01-17 MED ORDER — PROMETHAZINE-CODEINE 6.25-10 MG/5ML PO SYRP: 5.0000 mL | ORAL_SOLUTION | ORAL | Status: AC | PRN

## 2012-01-17 NOTE — Telephone Encounter (Signed)
Pt calling stating the tessalon pearls rx'd by Dr. Everardo All aren't helping. She states she is taking them tid and has to deal with customers and is constantly coughing. She is requesting something different for this. Please advise.

## 2012-01-17 NOTE — Telephone Encounter (Signed)
Ok prom-cod ROV if not better

## 2012-01-17 NOTE — Telephone Encounter (Signed)
Done. Pt informed.

## 2012-01-25 ENCOUNTER — Ambulatory Visit: Payer: 59 | Admitting: Internal Medicine

## 2012-01-25 DIAGNOSIS — Z0289 Encounter for other administrative examinations: Secondary | ICD-10-CM

## 2012-02-08 ENCOUNTER — Telehealth: Payer: Self-pay | Admitting: *Deleted

## 2012-02-08 NOTE — Telephone Encounter (Signed)
Pt left vm stating she has been coughing X 8 wks. She wants something else called in. Please advise.

## 2012-02-08 NOTE — Telephone Encounter (Signed)
Ds OV w/any MD this wk Thx

## 2012-02-09 MED ORDER — DOXYCYCLINE HYCLATE 100 MG PO TABS: 100.0000 mg | ORAL_TABLET | Freq: Two times a day (BID) | ORAL | Status: AC

## 2012-02-09 NOTE — Telephone Encounter (Signed)
Patient asked me to please forward this message to MD: does not want to come in to office and "have to pay a co-pay", stating that she has already been seen & had Xray, just wanting Rx. Please advise.

## 2012-02-09 NOTE — Telephone Encounter (Signed)
Doxy x 10 d OV if not well Thx

## 2012-02-09 NOTE — Telephone Encounter (Signed)
Left detailed mess informing pt of below.  

## 2012-03-06 ENCOUNTER — Telehealth: Payer: Self-pay | Admitting: Internal Medicine

## 2012-03-06 NOTE — Telephone Encounter (Signed)
Caller: Tina Bird/Patient; PCP: Sonda Primes; CB#: [phone]; Call regarding Bronchitis/Cough Since March 2013 and Feeling Better took antibiotic but is still had residual  hacking cough productive for White thick mucus to the point of gagging after biggest meal. Afebrile. No other sx.  Triage and Care advice per Cough Protocol and appnt advise within 3 days for "Recurring Cough AND known or suspected gastric reflux". Appnt scheduled for 1115 on 03/07/12.

## 2012-03-06 NOTE — Telephone Encounter (Signed)
OV w/me on Fri after 2 pm or w/in w/another MD avail Thx

## 2012-03-07 ENCOUNTER — Ambulatory Visit (INDEPENDENT_AMBULATORY_CARE_PROVIDER_SITE_OTHER): Payer: 59 | Admitting: Internal Medicine

## 2012-03-07 ENCOUNTER — Encounter: Payer: Self-pay | Admitting: Internal Medicine

## 2012-03-07 VITALS — BP 110/72 | HR 80 | Temp 98.3°F | Resp 16 | Wt 145.0 lb

## 2012-03-07 DIAGNOSIS — J45909 Unspecified asthma, uncomplicated: Secondary | ICD-10-CM

## 2012-03-07 DIAGNOSIS — R05 Cough: Secondary | ICD-10-CM

## 2012-03-07 MED ORDER — OMEPRAZOLE 40 MG PO CPDR: 40.0000 mg | DELAYED_RELEASE_CAPSULE | Freq: Every day | ORAL | Status: DC

## 2012-03-07 MED ORDER — FLUTICASONE-SALMETEROL 100-50 MCG/DOSE IN AEPB: 1.0000 | INHALATION_SPRAY | Freq: Two times a day (BID) | RESPIRATORY_TRACT | Status: DC

## 2012-03-07 MED ORDER — ALBUTEROL SULFATE HFA 108 (90 BASE) MCG/ACT IN AERS: 2.0000 | INHALATION_SPRAY | Freq: Four times a day (QID) | RESPIRATORY_TRACT | Status: DC | PRN

## 2012-03-07 MED ORDER — PROMETHAZINE-CODEINE 6.25-10 MG/5ML PO SYRP: 5.0000 mL | ORAL_SOLUTION | ORAL | Status: AC | PRN

## 2012-03-07 NOTE — Progress Notes (Signed)
Patient ID: Tina Bird, female   DOB: 07/28/1951, 61 y.o.   MRN: [id number]  Subjective:    Patient ID: Tina Bird, female    DOB: 23-Nov-1950, 61 y.o.   MRN: [id number]  Cough Pertinent negatives include no chills, ear pain, fever, rhinorrhea, shortness of breath or wheezing.   Pt states few weeks of  prod-quality spastic cough after dinners only - up to 1 h long, but no assoc wheezing.  She says this started with URI 4/13, which is now resolved.  Denies GERD, nasal drainage...   Past Medical History  Diagnosis Date  . LBP (low back pain)   . Vertigo   . Menopausal symptoms     Dr. Elana Alm  . Melanoma     Right leg and Right breast  . Osteoarthritis 07/27/2011    Past Surgical History  Procedure Date  . Cholecystectomy     History   Social History  . Marital Status: Married    Spouse Name: N/A    Number of Children: N/A  . Years of Education: N/A   Occupational History  . Not on file.   Social History Main Topics  . Smoking status: Former Games developer  . Smokeless tobacco: Not on file  . Alcohol Use: No  . Drug Use: No  . Sexually Active:    Other Topics Concern  . Not on file   Social History Narrative   Regular Exercise- no    Current Outpatient Prescriptions on File Prior to Visit  Medication Sig Dispense Refill  . Cholecalciferol 1000 UNITS tablet Take 1,000 Units by mouth daily.        . citalopram (CELEXA) 20 MG tablet Take 1 tablet (20 mg total) by mouth daily.  30 tablet  5  . ibuprofen (ADVIL,MOTRIN) 600 MG tablet Take 1 tablet (600 mg total) by mouth every 6 (six) hours as needed.  60 tablet  3  . meclizine (ANTIVERT) 25 MG tablet Take 12.5 mg by mouth 3 (three) times daily as needed.        [name] albuterol (PROVENTIL HFA) 108 (90 BASE) MCG/ACT inhaler Inhale 2 puffs into the lungs every 6 (six) hours as needed for wheezing.  8.5 g  11  . Fluticasone-Salmeterol (ADVAIR DISKUS) 100-50 MCG/DOSE AEPB Inhale 1 puff into the lungs 2 (two) times daily.  1  each  3  . omeprazole (PRILOSEC) 40 MG capsule Take 1 capsule (40 mg total) by mouth daily.  30 capsule  3  . pseudoephedrine (SUDAFED) 30 MG tablet Take 30 mg by mouth every 4 (four) hours as needed.          Allergies  Allergen Reactions  . Fexofenadine Hcl     Family History  Problem Relation Age of Onset  . Heart disease Brother   . Hypertension Other   . Alzheimer's disease Other     BP 110/72  Pulse 80  Temp(Src) 98.3 F (36.8 C) (Oral)  Resp 16  Wt 145 lb (65.772 kg)  Review of Systems  Constitutional: Negative for fever, chills and fatigue.  HENT: Negative for ear pain, congestion, rhinorrhea, sneezing and sinus pressure.   Respiratory: Positive for cough. Negative for chest tightness, shortness of breath, wheezing and stridor.   Gastrointestinal: Negative for vomiting.   Denies fever.  Sore throat is resolved    Objective:   Physical Exam  Constitutional: She appears well-developed and well-nourished.  HENT:  Nose: Nose normal.  Mouth/Throat: Oropharynx is clear and moist. No oropharyngeal  exudate.  Neck: No tracheal deviation present. No thyromegaly present.  Pulmonary/Chest: No stridor. She has no wheezes. She has no rales. She exhibits no tenderness.  Abdominal: She exhibits no mass.  Lymphadenopathy:    She has no cervical adenopathy.   VITAL SIGNS:  See vs page GENERAL: no distress head: no deformity eyes: no periorbital swelling, no proptosis external nose and ears are normal mouth: no lesion seen Both eac's and tm's are normal NECK: There is no palpable thyroid enlargement.  No thyroid nodule is palpable.  No palpable lymphadenopathy at the anterior neck. LUNGS:  Clear to auscultation   (i reviewed x-ray results from 4/13)  I personally provided the Advair and Proventil inhaler use teaching. After the teaching patient was able to demonstrate it's use effectively. All questions were answered     Assessment & Plan:

## 2012-03-07 NOTE — Assessment & Plan Note (Addendum)
Advair bid Ventolin MDI prn Prilosec qd Eat slowly Prom-cod syr prn Pulm ref prn

## 2012-03-07 NOTE — Assessment & Plan Note (Signed)
See meds 

## 2012-04-25 ENCOUNTER — Encounter: Payer: Self-pay | Admitting: Internal Medicine

## 2012-04-25 ENCOUNTER — Ambulatory Visit (INDEPENDENT_AMBULATORY_CARE_PROVIDER_SITE_OTHER): Payer: 59 | Admitting: Internal Medicine

## 2012-04-25 VITALS — BP 130/80 | HR 84 | Temp 99.3°F | Resp 16 | Wt 146.0 lb

## 2012-04-25 DIAGNOSIS — K148 Other diseases of tongue: Secondary | ICD-10-CM

## 2012-04-25 DIAGNOSIS — R05 Cough: Secondary | ICD-10-CM

## 2012-04-25 DIAGNOSIS — M545 Low back pain: Secondary | ICD-10-CM

## 2012-04-25 DIAGNOSIS — J45909 Unspecified asthma, uncomplicated: Secondary | ICD-10-CM

## 2012-04-25 NOTE — Assessment & Plan Note (Signed)
4/13 - reactive 7/13 - 50% better on Rx of GERD/asthma Use Advair daily Pulm cons Dr Sherene Sires

## 2012-04-25 NOTE — Assessment & Plan Note (Signed)
Continue with current prescription therapy as reflected on the Med list.  

## 2012-04-25 NOTE — Assessment & Plan Note (Signed)
Resolved

## 2012-04-25 NOTE — Patient Instructions (Signed)
Use advair daily

## 2012-04-25 NOTE — Progress Notes (Signed)
Subjective:    Patient ID: Tina Bird, female    DOB: [birth year], 61 y.o.   MRN: [id number]  Cough Pertinent negatives include no chills, ear pain, fever, rhinorrhea, shortness of breath or wheezing.   Pt states few weeks of  prod-quality spastic cough after dinners only - up to 1 h long, but no assoc wheezing.  She says this started with URI 4/13, which is now resolved. It is 50% better now   Denies GERD, nasal drainage...   Past Medical History  Diagnosis Date  . LBP (low back pain)   . Vertigo   . Menopausal symptoms     Dr. Elana Alm  . Melanoma     Right leg and Right breast  . Osteoarthritis 07/27/2011  . Asthmatic bronchitis 03/07/2012    Past Surgical History  Procedure Date  . Cholecystectomy     History   Social History  . Marital Status: Married    Spouse Name: N/A    Number of Children: N/A  . Years of Education: N/A   Occupational History  . Not on file.   Social History Main Topics  . Smoking status: Former Games developer  . Smokeless tobacco: Not on file  . Alcohol Use: No  . Drug Use: No  . Sexually Active:    Other Topics Concern  . Not on file   Social History Narrative   Regular Exercise- no    Current Outpatient Prescriptions on File Prior to Visit  Medication Sig Dispense Refill  . albuterol (PROVENTIL HFA) 108 (90 BASE) MCG/ACT inhaler Inhale 2 puffs into the lungs every 6 (six) hours as needed for wheezing.  8.5 g  11  . Cholecalciferol 1000 UNITS tablet Take 1,000 Units by mouth daily.        . citalopram (CELEXA) 20 MG tablet Take 1 tablet (20 mg total) by mouth daily.  30 tablet  5  . Fluticasone-Salmeterol (ADVAIR DISKUS) 100-50 MCG/DOSE AEPB Inhale 1 puff into the lungs 2 (two) times daily.  1 each  3  . ibuprofen (ADVIL,MOTRIN) 600 MG tablet Take 1 tablet (600 mg total) by mouth every 6 (six) hours as needed.  60 tablet  3  . meclizine (ANTIVERT) 25 MG tablet Take 12.5 mg by mouth 3 (three) times daily as needed.        [name]  omeprazole (PRILOSEC) 40 MG capsule Take 1 capsule (40 mg total) by mouth daily.  30 capsule  3  . pseudoephedrine (SUDAFED) 30 MG tablet Take 30 mg by mouth every 4 (four) hours as needed.          Allergies  Allergen Reactions  . Fexofenadine Hcl     Family History  Problem Relation Age of Onset  . Heart disease Brother   . Hypertension Other   . Alzheimer's disease Other     BP 130/80  Pulse 84  Temp 99.3 F (37.4 C) (Oral)  Resp 16  Wt 146 lb (66.225 kg)  Review of Systems  Constitutional: Negative for fever, chills and fatigue.  HENT: Negative for ear pain, congestion, rhinorrhea, sneezing and sinus pressure.   Respiratory: Positive for cough. Negative for chest tightness, shortness of breath, wheezing and stridor.   Gastrointestinal: Negative for vomiting.   Denies fever.  Sore throat is resolved    Objective:   Physical Exam  Constitutional: She appears well-developed and well-nourished.  HENT:  Nose: Nose normal.  Mouth/Throat: Oropharynx is clear and moist. No oropharyngeal exudate.  Neck: No tracheal  deviation present. No thyromegaly present.  Pulmonary/Chest: No stridor. She has no wheezes. She has no rales. She exhibits no tenderness.  Abdominal: She exhibits no mass.  Lymphadenopathy:    She has no cervical adenopathy.   VITAL SIGNS:  See vs page GENERAL: no distress head: no deformity eyes: no periorbital swelling, no proptosis external nose and ears are normal mouth: no lesion seen Both eac's and tm's are normal NECK: There is no palpable thyroid enlargement.  No thyroid nodule is palpable.  No palpable lymphadenopathy at the anterior neck. LUNGS:  Clear to auscultation        Assessment & Plan:

## 2012-04-25 NOTE — Assessment & Plan Note (Signed)
Cont Prilosec 7/13 - 50% better on Rx

## 2012-05-08 ENCOUNTER — Telehealth: Payer: Self-pay | Admitting: *Deleted

## 2012-05-08 DIAGNOSIS — Z0000 Encounter for general adult medical examination without abnormal findings: Secondary | ICD-10-CM

## 2012-05-08 NOTE — Telephone Encounter (Signed)
CPE labs entered.  

## 2012-05-08 NOTE — Telephone Encounter (Signed)
Message copied by Merrilyn Puma on Mon May 08, 2012  8:45 AM ------      Message from: Etheleen Sia      Created: Tue Apr 25, 2012  9:49 AM      Regarding: LABS       PHYSICAL LABS FOR JAN 2014

## 2012-05-25 ENCOUNTER — Ambulatory Visit (INDEPENDENT_AMBULATORY_CARE_PROVIDER_SITE_OTHER): Payer: 59 | Admitting: Internal Medicine

## 2012-05-25 ENCOUNTER — Encounter: Payer: Self-pay | Admitting: Internal Medicine

## 2012-05-25 VITALS — BP 134/80 | HR 72 | Temp 98.4°F | Ht 65.0 in | Wt 145.6 lb

## 2012-05-25 DIAGNOSIS — R05 Cough: Secondary | ICD-10-CM

## 2012-05-25 MED ORDER — FAMOTIDINE 20 MG PO TABS: ORAL_TABLET | ORAL | Status: DC

## 2012-05-25 MED ORDER — TRAMADOL HCL 50 MG PO TABS
ORAL_TABLET | ORAL | Status: AC
Start: 2012-05-25 — End: 2012-06-04

## 2012-05-25 MED ORDER — PREDNISONE (PAK) 10 MG PO TABS: ORAL_TABLET | ORAL | Status: AC

## 2012-05-25 NOTE — Progress Notes (Signed)
  Subjective:    Patient ID: Tina Bird, female    DOB: 1951/04/18  MRN: [id number]  HPI  23 yowf quit smoking 2003 no resp problems  X seasonal spring = fall x decades controlled with antihistamines and decongestant  referrred 05/25/2012 to pulmonary clinic by Dr Posey Rea for chronic cough.  05/25/2012 1st pulmonary eval/Wert cc abupt onset cough Easter 2013 "everyone else in family was sick with same bug" already treated with zpack, tessilon, advair, proventil > only relief was the cough syrup that contained codiene .  Never pred. Presently cough prod tiny amts thick mucs p a lot of effort, mostly daytime, no longer during the night. No assoc sob or wheeze.  No obvious daytime variabilty or assoc chronic cough or cp or chest tightness, subjective wheeze overt sinus or hb symptoms. No unusual exp hx.  ROS  The following are not active complaints unless bolded sore throat, dysphagia, dental problems, itching, sneezing,  nasal congestion or excess/ purulent secretions, ear ache,   fever, chills, sweats, unintended wt loss, pleuritic or exertional cp, hemoptysis,  orthopnea pnd or leg swelling, presyncope, palpitations, heartburn, abdominal pain, anorexia, nausea, vomiting, diarrhea  or change in bowel or urinary habits, change in stools or urine, dysuria,hematuria,  rash, arthralgias, visual complaints, headache, numbness weakness or ataxia or problems with walking or coordination,  change in mood/affect or memory.       Review of Systems  Constitutional: Negative for fever, chills and unexpected weight change.  HENT: Negative for ear pain, nosebleeds, congestion, sore throat, rhinorrhea, sneezing, trouble swallowing, dental problem, voice change, postnasal drip and sinus pressure.   Eyes: Negative for visual disturbance.  Respiratory: Positive for cough. Negative for choking and shortness of breath.   Cardiovascular: Negative for chest pain and leg swelling.  Gastrointestinal: Negative for  vomiting, abdominal pain and diarrhea.  Genitourinary: Negative for difficulty urinating.  Musculoskeletal: Negative for arthralgias.  Skin: Negative for rash.  Neurological: Negative for tremors, syncope and headaches.  Hematological: Does not bruise/bleed easily.       Objective:   Physical Exam  Wt Readings from Last 3 Encounters:  05/25/12 145 lb 9.6 oz (66.044 kg)  04/25/12 146 lb (66.225 kg)  03/07/12 145 lb (65.772 kg)    HEENT: nl dentition, turbinates, and orophanx. Nl external ear canals without cough reflex   NECK :  without JVD/Nodes/TM/ nl carotid upstrokes bilaterally   LUNGS: no acc muscle use, clear to A and P bilaterally without cough on insp or exp maneuvers   CV:  RRR  no s3 or murmur or increase in P2, no edema   ABD:  soft and nontender with nl excursion in the supine position. No bruits or organomegaly, bowel sounds nl  MS:  warm without deformities, calf tenderness, cyanosis or clubbing  SKIN: warm and dry without lesions    NEURO:  alert, approp, no deficits   CXR  05/25/2012 :  ordered     Assessment & Plan:

## 2012-05-25 NOTE — Assessment & Plan Note (Signed)
The most common causes of chronic cough in immunocompetent adults include the following: upper airway cough syndrome (UACS), previously referred to as postnasal drip syndrome (PNDS), which is caused by variety of rhinosinus conditions; (2) asthma; (3) GERD; (4) chronic bronchitis from cigarette smoking or other inhaled environmental irritants; (5) nonasthmatic eosinophilic bronchitis; and (6) bronchiectasis.   These conditions, singly or in combination, have accounted for up to 94% of the causes of chronic cough in prospective studies.   Other conditions have constituted no >6% of the causes in prospective studies These have included bronchogenic carcinoma, chronic interstitial pneumonia, sarcoidosis, left ventricular failure, ACEI-induced cough, and aspiration from a condition associated with pharyngeal dysfunction.   Most likely this is  Classic Upper airway cough syndrome, so named because it's frequently impossible to sort out how much is  CR/sinusitis with freq throat clearing (which can be related to primary GERD)   vs  causing  secondary (" extra esophageal")  GERD from wide swings in gastric pressure that occur with throat clearing, often  promoting self use of mint and menthol lozenges that reduce the lower esophageal sphincter tone and exacerbate the problem further in a cyclical fashion.   These are the same pts (now being labeled as having "irritable larynx syndrome" by some cough centers) who not infrequently have a history of having failed to tolerate ace inhibitors,  dry powder inhalers or biphosphonates or report having atypical reflux symptoms that don't respond to standard doses of PPI , and are easily confused as having aecopd or asthma flares by even experienced allergists/ pulmonologists.   For now rx as gerd/cyclical cough >  See instructions for specific recommendations which were reviewed directly with the patient who was given a copy with highlighter outlining the key components.

## 2012-05-25 NOTE — Patient Instructions (Addendum)
Please remember to go to the  x-ray department downstairs for your tests - we will call you with the results when they are available.   The key to effective treatment for your cough is eliminating the non-stop cycle of cough you're stuck in long enough to let your airway heal completely and then see if there is anything still making you cough once you stop the cough suppression, but this should take no more than 5 days to figuure out  First take delsym two tsp every 12 hours and supplement if needed with  tramadol 50 mg up to 2 every 4 hours to suppress the urge to cough. Swallowing water or using ice chips/non mint and menthol containing candies (such as lifesavers or sugarless jolly ranchers) are also effective.  You should rest your voice and avoid activities that you know make you cough.  Once you have eliminated the cough for 3 straight days try reducing the tramadol first,  then the delsym as tolerated.    Try prilosec 20mg   Take 30-60 min before first meal of the day and Pepcid 20 mg one bedtime until cough is completely gone for at least a week without the need for cough suppression  I think of reflux for chronic cough like I do oxygen for fire (doesn't cause the fire but once you get the oxygen suppressed it usually goes away regardless of the exact cause).  GERD (REFLUX)  is an extremely common cause of respiratory symptoms, many times with no significant heartburn at all.    It can be treated with medication, but also with lifestyle changes including avoidance of late meals, excessive alcohol, smoking cessation, and avoid fatty foods, chocolate, peppermint, colas, red wine, and acidic juices such as orange juice.  NO MINT OR MENTHOL PRODUCTS SO NO COUGH DROPS  USE SUGARLESS CANDY INSTEAD (jolley ranchers or Stover's)  NO OIL BASED VITAMINS - use powdered substitutes.    Prednisone 10 mg take  4 each am x 2 days,   2 each am x 2 days,  1 each am x 2days and stop    If 100% better tell  your friends, if not > tell us!

## 2012-07-12 ENCOUNTER — Encounter (INDEPENDENT_AMBULATORY_CARE_PROVIDER_SITE_OTHER): Payer: Self-pay | Admitting: Surgery

## 2012-07-12 ENCOUNTER — Ambulatory Visit (INDEPENDENT_AMBULATORY_CARE_PROVIDER_SITE_OTHER): Payer: 59 | Admitting: Surgery

## 2012-07-12 VITALS — BP 148/88 | HR 72 | Temp 98.0°F | Ht 65.0 in | Wt 149.0 lb

## 2012-07-12 DIAGNOSIS — K589 Irritable bowel syndrome without diarrhea: Secondary | ICD-10-CM

## 2012-07-12 DIAGNOSIS — K5730 Diverticulosis of large intestine without perforation or abscess without bleeding: Secondary | ICD-10-CM | POA: Insufficient documentation

## 2012-07-12 DIAGNOSIS — K648 Other hemorrhoids: Secondary | ICD-10-CM

## 2012-07-12 MED ORDER — HYDROCORTISONE ACE-PRAMOXINE 2.5-1 % RE CREA: TOPICAL_CREAM | Freq: Four times a day (QID) | RECTAL | Status: DC

## 2012-07-12 NOTE — Patient Instructions (Addendum)
See the Handout(s) we gave you.  Consider surgery.  Please call our office at [phone] if you wish to schedule surgery or if you have further questions / concerns.    HEMORRHOIDS   The rectum is the last few inches of your colon, and it naturally stretches to hold stool.  Hemorrhoidal piles are natural clusters of blood vessels that help the rectum stretch to hold stool and allow bowel movements to eliminate feces.  Hemorrhoids are abnormally swollen blood vessels in the rectum.  Too much pressure in the rectum causes hemorrhoids by forcing blood to stretch and bulge the walls of the veins, sometimes even rupturing them.  Hemorrhoids can become like varicose veins you might see on a person's legs. When bulging hemorrhoidal veins are irritated, they can swell, burn, itch, become very painful, and bleed. Once the rectal veins have been stretched out and hemorrhoids created, they are difficult to get rid of completely and tend to recur with less straining than it took to cause them in the first place. Fortunately, good habits and simple medical treatment usually control hemorrhoids well, and surgery is only recommended in unusually severe cases. Some of the most frequent causes of hemorrhoids:    Constant sitting    Straining with bowel movements (from constipation or hard stools)    Diarrhea    Sitting on the toilet for a long time    Severe coughing    Childbirth    Heavy Lifting  Types of Hemorrhoids:    Internal hemorrhoids usually don't hurt or itch; they are deep inside the rectum and usually have no sensation. However, internal hemorrhoids can bleed.  Such bleeding should not be ignored and mask blood from a dangerous source like colorectal cancer, so persistent rectal bleeding should be investigated with a colonoscopy.    External hemorrhoids cause most of the symptoms - pain, burning, and itching. Unirritated hemorrhoids can look like small skin tags coming out of the anus.     Thrombosed hemorrhoids can form when a hemorrhoid blood vessel bursts and causes the hemorrhoid to swell.  A purple blood clot can form in it and become an excruciatingly painful lump at the anus. Because of these unpleasant symptoms, immediate incision and drainage by a surgeon at an office visit can provide much relief of the pain.    PREVENTION Avoiding the causes listed in above will prevent most cases of hemorrhoids, but this advice is sometimes hard to follow:  How can you avoid sitting all day if you have a seated job? Also, we try to avoid coughing and diarrhea, but sometimes it's beyond your control.  Still, there are some practical hints to help:    If your main job activity is seated, always stand or walk during your breaks. Make it a point to stand and walk at least 5 minutes every hour and try to shift frequently in your chair to avoid direct rectal pressure.    Always exhale as you strain or lift. Don't hold your breath.    Treat coughing, diarrhea and constipation early since irritated hemorrhoids may soon follow.    Do not delay or try to prevent a bowel movement when the urge is present.   Exercise regularly (walking or jogging 60 minutes a day) to stimulate the bowels to move.   Avoid dry toilet paper when cleaning after bowel movements.  Moistened tissues such as baby wipes are less irritating.  Lightly pat the rectal area dry.  Using irrigating showers   or bottle irrigation washing can more gently clean this sensitive area.   Keep the anal and genital area clean and  dry.  Talcum or baby powders can help   GET YOUR STOOLS SOFT.   This is the most important way to prevent irritated hemorrhoids.  Hard stools are like sandpaper to the anorectal canal and will cause more problems.   The goal: ONE SOFT BOWEL MOVEMENT A DAY!  To have soft, regular bowel movements:    Drink at least 8 tall glasses of water a day.     AVOID CONSTIPATION    Take plenty of fiber.  Fiber is the undigested  part of plant food that passes into the colon, acting s "natures broom" to encourage bowel motility and movement.  Fiber can absorb and hold large amounts of water. This results in a larger, bulkier stool, which is soft and easier to pass. Work gradually over several weeks up to 6 servings a day of fiber (25g a day even more if needed) in the form of: o Vegetables -- Root (potatoes, carrots, turnips), leafy green (lettuce, salad greens, celery, spinach), or cooked high residue (cabbage, broccoli, etc) o Fruit -- Fresh (unpeeled skin & pulp), Dried (prunes, apricots, cherries, etc ),  or stewed ( applesauce)  o Whole grain breads, pasta, etc (whole wheat)  o Bran cereals    Bulking Agents -- This type of water-retaining fiber generally is easily obtained each day by one of the following:  o Psyllium bran -- The psyllium plant is remarkable because its ground seeds can retain so much water. This product is available as Metamucil, Konsyl, Effersyllium, Per Diem Fiber, or the less expensive generic preparation in drug and health food stores. Although labeled a laxative, it really is not a laxative.  o Methylcellulose -- This is another fiber derived from wood which also retains water. It is available as Citrucel. o Polyethylene Glycol - and "artificial" fiber commonly called Miralax or Glycolax.  It is helpful for people with gassy or bloated feelings with regular fiber o Flax Seed - a less gassy fiber than psyllium   No reading or other relaxing activity while on the toilet. If bowel movements take longer than 5 minutes, you are too constipated   Laxatives can be useful for a short period if constipation is severe o Osmotics (Milk of Magnesia, Fleets phosphosoda, Magnesium citrate, MiraLax, GoLytely) are safer than  o Stimulants (Senokot, Castor Oil, Dulcolax, Ex Lax)    o Do not take laxatives for more than 7days in a row.   Laxatives are not a good long-term solution as it can stress the intestine and  colon and causes too much mineral and fluid losses.    If badly constipated, try a Bowel Retraining Program: o Do not use laxatives.  o Eat a diet high in roughage, such as bran cereals and leafy vegetables.  o Drink six (6) ounces of prune or apricot juice each morning.  o Eat two (2) large servings of stewed fruit each day.  o Take one (1) heaping dose of a bulking agent (ex. Metamucil, Citrucel, Miralax) twice a day.  o Use sugar-free sweetener when possible to avoid excessive calories.  o Eat a normal breakfast.  o Set aside 15 minutes after breakfast to sit on the toilet, but do not strain to have a bowel movement.  o If you do not have a bowel movement by the third day, use an enema and repeat the above steps.      AVOID DIARRHEA o Switch to liquids and simpler foods for a few days to avoid stressing your intestines further. o Avoid dairy products (especially milk & ice cream) for a short time.  The intestines often can lose the ability to digest lactose when stressed. o Avoid foods that cause gassiness or bloating.  Typical foods include beans and other legumes, cabbage, broccoli, and dairy foods.  Every person has some sensitivity to other foods, so listen to our body and avoid those foods that trigger problems for you. o Adding fiber (Citrucel, Metamucil, psyllium, Miralax) gradually can help thicken stools by absorbing excess fluid and retrain the intestines to act more normally.  Slowly increase the dose over a few weeks.  Too much fiber too soon can backfire and cause cramping & bloating. o Probiotics (such as active yogurt, Align, etc) may help repopulate the intestines and colon with normal bacteria and calm down a sensitive digestive tract.  Most studies show it to be of mild help, though, and such products can be costly. o Medicines:   Bismuth subsalicylate (ex. Kayopectate, Pepto Bismol) every 30 minutes for up to 6 doses can help control diarrhea.  Avoid if pregnant.   Loperamide  (Immodium) can slow down diarrhea.  Start with two tablets (4mg total) first and then try one tablet every 6 hours.  Avoid if you are having fevers or severe pain.  If you are not better or start feeling worse, stop all medicines and call your doctor for advice o Call your doctor if you are getting worse or not better.  Sometimes further testing (cultures, endoscopy, X-ray studies, bloodwork, etc) may be needed to help diagnose and treat the cause of the diarrhea.   If these preventive measures fail, you must take action right away! Hemorrhoids are one condition that can be mild in the morning and become intolerable by nightfall.      

## 2012-07-12 NOTE — Progress Notes (Signed)
Subjective:     Patient ID: Tina Bird, female   DOB: April 11, 1951, 61 y.o.   MRN: [id number]  HPI  Tina Bird  12-11-1950 [id number]  Patient Care Team: Tresa Garter, MD as PCP - General Iva Boop, MD as Consulting Physician (Gastroenterology)  This patient is a 61 y.o.female who presents today for surgical evaluation.   Reason for evaluation: Persistent hemorrhoids.  Now prolapsing.  Pleasant elderly woman.  Struggles with loose bowels.  Not a more normal range.  Has had problems with hemorrhoidal discomfort.  I injected some mildly prolapsing hemorrhoids in 2008.  She tells me that works well for quite some time.  Began to get more symptomatic.  I injected again per her request in 2012.  I worked well until this year.  She notes she is getting more prolapse.  Often has staining.  Discomfort.  Irritating at work.  She is now having to push the hemorrhoids in.  She usually has 3-4 smaller bowel movements in the morning.  We will have a bowel movement and then has the urge to continue to move them.  Never feels like she fully empties.  She did not want to consider surgery in the past.  However with them prolapsing more, she is wondering if it is time now.  Patient Active Problem List  Diagnosis  . MENOPAUSAL SYNDROME  . LOW BACK PAIN  . DIZZINESS  . HIP PAIN  . Osteoarthritis  . Hip pain, chronic  . Glossal erythema  . Cough  . Asthmatic bronchitis  . Internal hemorrhoids with prolapse & pain  . IBS (irritable bowel syndrome)  . Diverticulosis of colon with sigmoid stricture    Past Medical History  Diagnosis Date  . LBP (low back pain)   . Vertigo   . Menopausal symptoms     Dr. Elana Alm  . Melanoma     Right leg and Right breast  . Osteoarthritis 07/27/2011  . Asthmatic bronchitis 03/07/2012  . GERD (gastroesophageal reflux disease)     Past Surgical History  Procedure Date  . Cholecystectomy     History   Social History  . Marital Status:  Married    Spouse Name: N/A    Number of Children: N/A  . Years of Education: N/A   Occupational History  . Not on file.   Social History Main Topics  . Smoking status: Former Smoker -- 0.5 packs/day for 30 years    Types: Cigarettes    Quit date: 10/04/2001  . Smokeless tobacco: Never Used  . Alcohol Use: No  . Drug Use: No  . Sexually Active: Not on file   Other Topics Concern  . Not on file   Social History Narrative   Regular Exercise- no    Family History  Problem Relation Age of Onset  . Heart disease Brother   . Hypertension Other   . Alzheimer's disease Other     Current Outpatient Prescriptions  Medication Sig Dispense Refill  . albuterol (PROVENTIL HFA) 108 (90 BASE) MCG/ACT inhaler Inhale 2 puffs into the lungs every 6 (six) hours as needed for wheezing.  8.5 g  11  . cetirizine (ZYRTEC) 10 MG tablet Take 10 mg by mouth daily as needed.      . Cholecalciferol 1000 UNITS tablet Take 1,000 Units by mouth daily.        . citalopram (CELEXA) 20 MG tablet Take 1 tablet (20 mg total) by mouth daily.  30 tablet  5  .  escitalopram (LEXAPRO) 10 MG tablet Take 10 mg by mouth daily.      . famotidine (PEPCID) 20 MG tablet One at bedtime  30 tablet  11  . ibuprofen (ADVIL,MOTRIN) 600 MG tablet Take 1 tablet (600 mg total) by mouth every 6 (six) hours as needed.  60 tablet  3  . meclizine (ANTIVERT) 25 MG tablet Take 12.5 mg by mouth 3 (three) times daily as needed.        [name] omeprazole (PRILOSEC) 40 MG capsule Take 1 capsule (40 mg total) by mouth daily.  30 capsule  3  . pseudoephedrine (SUDAFED) 30 MG tablet Take 30 mg by mouth every 4 (four) hours as needed.        . vitamin B-12 (CYANOCOBALAMIN) 1000 MCG tablet Take 1,000 mcg by mouth daily.      . hydrocortisone-pramoxine (ANALPRAM-HC) 2.5-1 % rectal cream Place rectally 4 (four) times daily. For irritated and painful hemorrhoids  15 g  2     Allergies  Allergen Reactions  . Fexofenadine Hcl     BP 148/88   Pulse 72  Temp 98 F (36.7 C) (Temporal)  Ht 5\' 5"  (1.651 m)  Wt 149 lb (67.586 kg)  BMI 24.79 kg/m2  SpO2 98%  No results found.   Review of Systems  Constitutional: Negative for fever, chills, diaphoresis, appetite change and fatigue.  HENT: Negative for ear pain, sore throat, trouble swallowing, neck pain and ear discharge.   Eyes: Negative for photophobia, discharge and visual disturbance.  Respiratory: Negative for cough, choking, chest tightness and shortness of breath.   Cardiovascular: Negative for chest pain and palpitations.  Gastrointestinal: Positive for diarrhea, anal bleeding and rectal pain. Negative for nausea, vomiting, abdominal pain and constipation.       No personal nor family history of GI/colon cancer, inflammatory bowel disease,   ? + irritable bowel syndrome  no allergy such as Celiac Sprue, dietary/dairy problems, colitis, ulcers nor gastritis.  No recent sick contacts/gastroenteritis.  No travel outside the country.  No changes in diet.    Genitourinary: Negative for dysuria, frequency and difficulty urinating.  Musculoskeletal: Negative for myalgias and gait problem.  Skin: Negative for color change, pallor and rash.  Neurological: Negative for dizziness, speech difficulty, weakness and numbness.  Hematological: Negative for adenopathy.  Psychiatric/Behavioral: Negative for confusion and agitation. The patient is not nervous/anxious.        Objective:   Physical Exam  Constitutional: She is oriented to person, place, and time. She appears well-developed and well-nourished. No distress.  HENT:  Head: Normocephalic.  Mouth/Throat: Oropharynx is clear and moist. No oropharyngeal exudate.  Eyes: Conjunctivae normal and EOM are normal. Pupils are equal, round, and reactive to light. No scleral icterus.  Neck: Normal range of motion. No tracheal deviation present.  Cardiovascular: Normal rate and intact distal pulses.   Pulmonary/Chest: Effort normal.  No respiratory distress. She exhibits no tenderness.  Abdominal: Soft. Bowel sounds are normal. She exhibits no distension and no mass. There is no tenderness. There is no rebound and no guarding. Hernia confirmed negative in the right inguinal area and confirmed negative in the left inguinal area.       Incisions clean with normal healing ridges.  No hernias  Genitourinary: No vaginal discharge found.       Exam done with assistance of female Medical Assistant in the room.  Perianal skin clean with good hygiene.  No pruritis.  No external skin tags / hemorrhoids of significance.  No pilonidal disease.  No fissure.  No abscess/fistula.    Tolerates digital and anoscopic rectal exam.  Normal sphincter tone.  Hemorrhoidal piles enlarged R post>R ant >>L lateral.  R post/ant prolapse easily   Musculoskeletal: Normal range of motion. She exhibits no tenderness.  Lymphadenopathy:       Right: No inguinal adenopathy present.       Left: No inguinal adenopathy present.  Neurological: She is alert and oriented to person, place, and time. No cranial nerve deficit. She exhibits normal muscle tone. Coordination normal.  Skin: Skin is warm and dry. No rash noted. She is not diaphoretic.  Psychiatric: She has a normal mood and affect. Her behavior is normal.       Assessment:     Internal hemorrhoids with worsening prolapse.  Encopresis.  Prior injections.    Plan:     Because she has recurrent problems after two injections, and now she has prolapse, I think this requires surgery.  She will be a candidate for [facility].  She is now open to the idea of this.  The anatomy & physiology of the anorectal region was discussed.  The pathophysiology of hemorrhoids and differential diagnosis was discussed.  Natural history risks without surgery was discussed.   I stressed the importance of a bowel regimen to have daily soft bowel movements to minimize progression of disease.  Interventions such as sclerotherapy &  banding were discussed.  The patient's symptoms are not adequately controlled by medicines and other non-operative treatments.  I feel the risks & problems of no surgery outweigh the operative risks; therefore, I recommended surgery to treat the hemorrhoids by ligation, pexy, and possible resection.  Risks such as bleeding, infection, need for further treatment, heart attack, death, and other risks were discussed.   I noted a good likelihood this will help address the problem.  Goals of post-operative recovery were discussed as well.  Possibility that this will not correct all symptoms was explained.  Post-operative pain, bleeding, constipation, and other problems after surgery were discussed.  We will work to minimize complications.   Educational handouts further explaining the pathology, treatment options, and bowel regimen were given as well.  Questions were answered.  The patient expresses understanding & wishes to proceed with surgery.

## 2012-07-13 ENCOUNTER — Other Ambulatory Visit: Payer: Self-pay | Admitting: Internal Medicine

## 2012-07-19 ENCOUNTER — Ambulatory Visit: Payer: 59 | Admitting: Internal Medicine

## 2012-08-10 ENCOUNTER — Telehealth (INDEPENDENT_AMBULATORY_CARE_PROVIDER_SITE_OTHER): Payer: Self-pay | Admitting: General Surgery

## 2012-08-10 NOTE — Telephone Encounter (Signed)
Message copied by Liliana Cline on Thu Aug 10, 2012  9:49 AM ------      Message from: Zacarias Pontes      Created: Thu Aug 10, 2012  8:58 AM       PT HAS QUESTIONS ABOUT HER SX.Marland KitchenSHE WOULD LIKE A CALL BACK FROM DR GROSS

## 2012-08-10 NOTE — Telephone Encounter (Signed)
Spoke with patient. She is not sure whether she wants to proceed with surgery and wanted to know the options. I told her, after reviewing her notes, that it sounds like alternative options have been exhausted. She had these areas treated in 2008 and 2012 and they are not better. I told her that Dr Michaell Cowing has recommended surgery. She asked if changing her diet would make the hemorrhoids go away. I made her aware that eating a high fiber diet and drinking plenty of liquids would help prevent future problems but would not eliminate her hemorrhoids she has now. She is aware the decision is always up to her. She will call with any other questions and if she changes her mild and wishes to cancel.

## 2012-08-29 ENCOUNTER — Ambulatory Visit: Payer: 59 | Admitting: Internal Medicine

## 2012-08-29 ENCOUNTER — Telehealth: Payer: Self-pay | Admitting: Internal Medicine

## 2012-08-29 ENCOUNTER — Ambulatory Visit (INDEPENDENT_AMBULATORY_CARE_PROVIDER_SITE_OTHER): Payer: 59 | Admitting: *Deleted

## 2012-08-29 DIAGNOSIS — Z23 Encounter for immunization: Secondary | ICD-10-CM

## 2012-08-29 NOTE — Telephone Encounter (Signed)
No can do - nothing else I can think to do over the phone- need to regroup with all active meds in hand including all otcs

## 2012-08-29 NOTE — Telephone Encounter (Signed)
Tina Bird, Tina Bird 08/29/2012 2:39 PM Signed  i spoke with pt and she is actually in the area now and is going to come over since MW had an opening at 3:00. Nothing further was needed Sandrea Hughs, MD 08/29/2012 2:34 PM Signed  No can do - nothing else I can think to do over the phone- need to regroup with all active meds in hand including all otcs Gweneth Dimitri, RN 08/29/2012 2:19 PM Signed  Called, spoke with pt. States cough improved but never went completely aware. States she has a slight prod cough with white mucus occasionally. Cough is mainly after eating. Denies SOB, wheezing, chest tightness, chest pain. Advised OV needed at pt was last seen Aug 2013 -- OV scheduled with MW for tomorrow at 3:30 pm. However, pt states she would rather have Dr. Sherene Sires call something in vs come in for an appt if he would be willing to do this. Dr. Sherene Sires, pls advise. Thank you -----------  I spoke with pt and advised her she needs OV that MW will not call in medication. She stated she will back to schedule appt. Will forward to MW so he is aware.

## 2012-08-29 NOTE — Telephone Encounter (Signed)
Called, spoke with pt.  States cough improved but never went completely aware.  States she has a slight prod cough with white mucus occasionally.  Cough is mainly after eating.  Denies SOB, wheezing, chest tightness, chest pain.  Advised OV needed at pt was last seen Aug 2013 -- OV scheduled with MW for tomorrow at 3:30 pm.  However, pt states she would rather have Dr. Sherene Sires call something in vs come in for an appt if he would be willing to do this.  Dr. Sherene Sires, pls advise.  Thank you.

## 2012-08-29 NOTE — Telephone Encounter (Signed)
i spoke with pt and she is actually in the area now and is going to come over since MW had an opening at 3:00. Nothing further was needed

## 2012-08-30 ENCOUNTER — Ambulatory Visit: Payer: 59 | Admitting: Internal Medicine

## 2012-08-30 ENCOUNTER — Encounter (HOSPITAL_COMMUNITY): Payer: Self-pay | Admitting: Pharmacy Technician

## 2012-09-06 ENCOUNTER — Encounter (HOSPITAL_COMMUNITY)
Admission: RE | Admit: 2012-09-06 | Discharge: 2012-09-06 | Disposition: A | Discharge status: Home / Self Care | Payer: 59 | Source: Ambulatory Visit | Attending: Surgery | Admitting: Surgery

## 2012-09-06 ENCOUNTER — Encounter (HOSPITAL_COMMUNITY): Payer: Self-pay

## 2012-09-06 LAB — CBC
Platelets: 248 10*3/uL (ref 150–400)
RBC: 4.41 MIL/uL (ref 3.87–5.11)
RDW: 12.5 % (ref 11.5–15.5)
WBC: 4.8 10*3/uL (ref 4.0–10.5)

## 2012-09-06 LAB — SURGICAL PCR SCREEN: MRSA, PCR: NEGATIVE

## 2012-09-06 NOTE — Patient Instructions (Addendum)
20 Tina Bird  09/06/2012   Your procedure is scheduled on:   09-11-2012  Report to [facility] at    0515    AM .  Call this number if you have problems the morning of surgery: [phone]  Or Presurgical Testing [phone](Pasqualina Colasurdo)   Remember:   Do not eat food:After Midnight.   Take these medicines the morning of surgery with A SIP OF WATER:Omeprazole. Cetirizine. Follow any bowel prep instructions per office.    Do not wear jewelry, make-up or nail polish.  Do not wear lotions, powders, or perfumes. You may wear deodorant.  Do not shave 48 hours prior to surgery.(face and neck okay, no shaving of legs)  Do not bring valuables to the hospital.  Contacts, dentures or bridgework may not be worn into surgery.  Leave suitcase in the car. After surgery it may be brought to your room.  For patients admitted to the hospital, checkout time is 11:00 AM the day of discharge.   Patients discharged the day of surgery will not be allowed to drive home. Must have responsible person with you x 24 hours once discharged.  Name and phone number of your driver: "Jena Gauss , spouse [phone] cell  Special Instructions: CHG Shower Use Special Wash:see special instruction sheet.(avoid face and genitals)   Please read over the following fact sheets that you were given: MRSA Information.                                                      Failure to follow these instructions may result in Cancellation of your Surgery.              20 Tina Bird  09/06/2012 Patient Signature_____________________________________________________________________

## 2012-09-08 DIAGNOSIS — J45909 Unspecified asthma, uncomplicated: Secondary | ICD-10-CM

## 2012-09-08 HISTORY — DX: Unspecified asthma, uncomplicated: J45.909

## 2012-09-10 NOTE — Anesthesia Preprocedure Evaluation (Signed)
Anesthesia Evaluation  Patient identified by MRN, date of birth, ID band Patient awake    Reviewed: Allergy & Precautions, H&P , NPO status , Patient's Chart, lab work & pertinent test results, reviewed documented beta blocker date and time   Airway Mallampati: II TM Distance: >3 FB Neck ROM: full    Dental No notable dental hx.    Pulmonary neg pulmonary ROS, asthma ,  breath sounds clear to auscultation  Pulmonary exam normal       Cardiovascular Exercise Tolerance: Good negative cardio ROS  Rhythm:regular Rate:Normal     Neuro/Psych negative neurological ROS  negative psych ROS   GI/Hepatic negative GI ROS, Neg liver ROS, GERD-  Medicated,  Endo/Other  negative endocrine ROS  Renal/GU negative Renal ROS  negative genitourinary   Musculoskeletal   Abdominal   Peds  Hematology negative hematology ROS (+)   Anesthesia Other Findings   Reproductive/Obstetrics negative OB ROS                           Anesthesia Physical Anesthesia Plan  ASA: II  Anesthesia Plan: General ETT   Post-op Pain Management:    Induction:   Airway Management Planned:   Additional Equipment:   Intra-op Plan:   Post-operative Plan:   Informed Consent: I have reviewed the patients History and Physical, chart, labs and discussed the procedure including the risks, benefits and alternatives for the proposed anesthesia with the patient or authorized representative who has indicated his/her understanding and acceptance.   Dental Advisory Given  Plan Discussed with: CRNA  Anesthesia Plan Comments:         Anesthesia Quick Evaluation

## 2012-09-11 ENCOUNTER — Encounter (HOSPITAL_COMMUNITY): Payer: Self-pay | Admitting: Anesthesiology

## 2012-09-11 ENCOUNTER — Ambulatory Visit (HOSPITAL_COMMUNITY): Payer: 59 | Admitting: Anesthesiology

## 2012-09-11 ENCOUNTER — Encounter (HOSPITAL_COMMUNITY): Payer: Self-pay | Admitting: *Deleted

## 2012-09-11 ENCOUNTER — Encounter (HOSPITAL_COMMUNITY)
Admission: RE | Disposition: A | Discharge status: Home / Self Care | Payer: Self-pay | Source: Ambulatory Visit | Attending: Surgery

## 2012-09-11 ENCOUNTER — Ambulatory Visit (HOSPITAL_COMMUNITY)
Admission: RE | Admit: 2012-09-11 | Discharge: 2012-09-11 | Disposition: A | Discharge status: Home / Self Care | Payer: 59 | Source: Ambulatory Visit | Attending: Surgery | Admitting: Surgery

## 2012-09-11 DIAGNOSIS — Z01812 Encounter for preprocedural laboratory examination: Secondary | ICD-10-CM | POA: Insufficient documentation

## 2012-09-11 DIAGNOSIS — Z79899 Other long term (current) drug therapy: Secondary | ICD-10-CM | POA: Insufficient documentation

## 2012-09-11 DIAGNOSIS — J45909 Unspecified asthma, uncomplicated: Secondary | ICD-10-CM | POA: Insufficient documentation

## 2012-09-11 DIAGNOSIS — K648 Other hemorrhoids: Secondary | ICD-10-CM | POA: Insufficient documentation

## 2012-09-11 DIAGNOSIS — K219 Gastro-esophageal reflux disease without esophagitis: Secondary | ICD-10-CM | POA: Insufficient documentation

## 2012-09-11 HISTORY — PX: TRANSANAL HEMORRHOIDAL DEARTERIALIZATION: SHX6136

## 2012-09-11 HISTORY — PX: PEXY: SHX6024

## 2012-09-11 SURGERY — TRANSANAL HEMORRHOIDAL DEARTERIALIZATION
Anesthesia: General | Site: Anus | Wound class: Contaminated

## 2012-09-11 MED ORDER — SODIUM CHLORIDE 0.9 % IJ SOLN
INTRAMUSCULAR | Status: DC | PRN
  Administered 2012-09-11: 10 mL

## 2012-09-11 MED ORDER — CISATRACURIUM BESYLATE (PF) 10 MG/5ML IV SOLN
INTRAVENOUS | Status: DC | PRN
  Administered 2012-09-11: 4 mg via INTRAVENOUS

## 2012-09-11 MED ORDER — BUPIVACAINE HCL (PF) 0.25 % IJ SOLN
INTRAMUSCULAR | Status: AC
  Filled 2012-09-11: qty 30

## 2012-09-11 MED ORDER — DEXAMETHASONE SODIUM PHOSPHATE 10 MG/ML IJ SOLN
INTRAMUSCULAR | Status: DC | PRN
  Administered 2012-09-11: 6 mg via INTRAVENOUS

## 2012-09-11 MED ORDER — ONDANSETRON HCL 4 MG/2ML IJ SOLN
INTRAMUSCULAR | Status: DC | PRN
  Administered 2012-09-11: 4 mg via INTRAVENOUS

## 2012-09-11 MED ORDER — LIDOCAINE HCL (CARDIAC) 20 MG/ML IV SOLN
INTRAVENOUS | Status: DC | PRN
  Administered 2012-09-11: 100 mg via INTRAVENOUS

## 2012-09-11 MED ORDER — CEFOXITIN SODIUM-DEXTROSE 1-4 GM-% IV SOLR (PREMIX)
INTRAVENOUS | Status: AC
  Filled 2012-09-11: qty 100

## 2012-09-11 MED ORDER — EPHEDRINE SULFATE 50 MG/ML IJ SOLN
INTRAMUSCULAR | Status: DC | PRN
  Administered 2012-09-11 (×2): 5 mg via INTRAVENOUS
  Administered 2012-09-11: 10 mg via INTRAVENOUS

## 2012-09-11 MED ORDER — BUPIVACAINE LIPOSOME 1.3 % IJ SUSP
20.0000 mL | Freq: Once | INTRAMUSCULAR | Status: DC
  Filled 2012-09-11: qty 20

## 2012-09-11 MED ORDER — BUPIVACAINE-EPINEPHRINE 0.25% -1:200000 IJ SOLN
INTRAMUSCULAR | Status: AC
  Filled 2012-09-11: qty 1

## 2012-09-11 MED ORDER — PROMETHAZINE HCL 25 MG/ML IJ SOLN: 6.2500 mg | INTRAMUSCULAR | Status: DC | PRN

## 2012-09-11 MED ORDER — PROPOFOL 10 MG/ML IV BOLUS
INTRAVENOUS | Status: DC | PRN
  Administered 2012-09-11: 150 mg via INTRAVENOUS

## 2012-09-11 MED ORDER — HYALURONIDASE OVINE 200 UNIT/ML IJ SOLN
INTRAMUSCULAR | Status: AC
  Filled 2012-09-11: qty 1.2

## 2012-09-11 MED ORDER — OXYCODONE HCL 5 MG PO TABS: 5.0000 mg | ORAL_TABLET | ORAL | Status: DC | PRN

## 2012-09-11 MED ORDER — SUCCINYLCHOLINE CHLORIDE 20 MG/ML IJ SOLN
INTRAMUSCULAR | Status: DC | PRN
  Administered 2012-09-11: 100 mg via INTRAVENOUS

## 2012-09-11 MED ORDER — MIDAZOLAM HCL 5 MG/5ML IJ SOLN
INTRAMUSCULAR | Status: DC | PRN
  Administered 2012-09-11: 2 mg via INTRAVENOUS

## 2012-09-11 MED ORDER — ACETAMINOPHEN 10 MG/ML IV SOLN
INTRAVENOUS | Status: AC
  Filled 2012-09-11: qty 100

## 2012-09-11 MED ORDER — FENTANYL CITRATE 0.05 MG/ML IJ SOLN
INTRAMUSCULAR | Status: DC | PRN
  Administered 2012-09-11: 150 ug via INTRAVENOUS
  Administered 2012-09-11: 50 ug via INTRAVENOUS

## 2012-09-11 MED ORDER — BUPIVACAINE LIPOSOME 1.3 % IJ SUSP
INTRAMUSCULAR | Status: DC | PRN
  Administered 2012-09-11: 20 mL

## 2012-09-11 MED ORDER — KETOROLAC TROMETHAMINE 30 MG/ML IJ SOLN
INTRAMUSCULAR | Status: DC | PRN
  Administered 2012-09-11: 30 mg via INTRAVENOUS

## 2012-09-11 MED ORDER — CHLORHEXIDINE GLUCONATE 4 % EX LIQD
1.0000 "application " | Freq: Once | CUTANEOUS | Status: DC
  Filled 2012-09-11: qty 15

## 2012-09-11 MED ORDER — ACETAMINOPHEN 10 MG/ML IV SOLN
INTRAVENOUS | Status: DC | PRN
  Administered 2012-09-11: 1000 mg via INTRAVENOUS

## 2012-09-11 MED ORDER — LACTATED RINGERS IV SOLN: INTRAVENOUS | Status: DC

## 2012-09-11 MED ORDER — HYDROMORPHONE HCL PF 1 MG/ML IJ SOLN: 0.2500 mg | INTRAMUSCULAR | Status: DC | PRN

## 2012-09-11 MED ORDER — HYDROCORTISONE ACE-PRAMOXINE 2.5-1 % RE CREA: TOPICAL_CREAM | Freq: Four times a day (QID) | RECTAL | Status: DC

## 2012-09-11 MED ORDER — LACTATED RINGERS IV SOLN
INTRAVENOUS | Status: DC | PRN
  Administered 2012-09-11: 07:00:00 via INTRAVENOUS

## 2012-09-11 MED ORDER — DEXTROSE 5 % IV SOLN
2.0000 g | INTRAVENOUS | Status: AC
  Administered 2012-09-11: 2 g via INTRAVENOUS
  Filled 2012-09-11: qty 2

## 2012-09-11 SURGICAL SUPPLY — 47 items
BLADE EXTENDED COATED 6.5IN (ELECTRODE) IMPLANT
BLADE HEX COATED 2.75 (ELECTRODE) ×2 IMPLANT
BLADE SURG 15 STRL LF DISP TIS (BLADE) ×2 IMPLANT
BLADE SURG 15 STRL SS (BLADE) ×4
BRIEF STRETCH FOR OB PAD LRG (UNDERPADS AND DIAPERS) IMPLANT
CANISTER SUCTION 2500CC (MISCELLANEOUS) ×2 IMPLANT
CLOTH BEACON ORANGE TIMEOUT ST (SAFETY) ×2 IMPLANT
DECANTER SPIKE VIAL GLASS SM (MISCELLANEOUS) ×2 IMPLANT
DRAPE LAPAROTOMY T 102X78X121 (DRAPES) ×2 IMPLANT
DRSG PAD ABDOMINAL 8X10 ST (GAUZE/BANDAGES/DRESSINGS) ×1 IMPLANT
ELECT REM PT RETURN 9FT ADLT (ELECTROSURGICAL) ×2
ELECTRODE REM PT RTRN 9FT ADLT (ELECTROSURGICAL) ×1 IMPLANT
GAUZE SPONGE 4X4 16PLY XRAY LF (GAUZE/BANDAGES/DRESSINGS) ×2 IMPLANT
GLOVE BIOGEL PI IND STRL 7.0 (GLOVE) ×1 IMPLANT
GLOVE BIOGEL PI INDICATOR 7.0 (GLOVE) ×1
GLOVE ECLIPSE 8.0 STRL XLNG CF (GLOVE) ×2 IMPLANT
GLOVE INDICATOR 8.0 STRL GRN (GLOVE) ×4 IMPLANT
GOWN STRL NON-REIN LRG LVL3 (GOWN DISPOSABLE) ×2 IMPLANT
GOWN STRL REIN XL XLG (GOWN DISPOSABLE) ×4 IMPLANT
HEMOSTAT SURGICEL 4X8 (HEMOSTASIS) ×2 IMPLANT
KIT BASIN OR (CUSTOM PROCEDURE TRAY) ×2 IMPLANT
KIT SLIDE ONE PROLAPS HEMORR (KITS) ×2 IMPLANT
LEGGING LITHOTOMY PAIR STRL (DRAPES) IMPLANT
LUBRICANT JELLY K Y 4OZ (MISCELLANEOUS) ×2 IMPLANT
NDL SAFETY ECLIPSE 18X1.5 (NEEDLE) ×1 IMPLANT
NEEDLE HYPO 18GX1.5 SHARP (NEEDLE) ×2
NEEDLE HYPO 22GX1.5 SAFETY (NEEDLE) ×2 IMPLANT
NS IRRIG 1000ML POUR BTL (IV SOLUTION) ×2 IMPLANT
PACK BASIC VI WITH GOWN DISP (CUSTOM PROCEDURE TRAY) ×2 IMPLANT
PACK LITHOTOMY IV (CUSTOM PROCEDURE TRAY) IMPLANT
PENCIL BUTTON HOLSTER BLD 10FT (ELECTRODE) ×2 IMPLANT
SPONGE GAUZE 4X4 12PLY (GAUZE/BANDAGES/DRESSINGS) ×1 IMPLANT
SPONGE SURGIFOAM ABS GEL 100 (HEMOSTASIS) ×2 IMPLANT
SPONGE SURGIFOAM ABS GEL 12-7 (HEMOSTASIS) IMPLANT
SUT CHROMIC 2 0 SH (SUTURE) IMPLANT
SUT CHROMIC 3 0 SH 27 (SUTURE) IMPLANT
SUT PROLENE 2 0 BLUE (SUTURE) ×3 IMPLANT
SUT VIC AB 2-0 SH 27 (SUTURE)
SUT VIC AB 2-0 SH 27X BRD (SUTURE) IMPLANT
SUT VIC AB 2-0 UR6 27 (SUTURE) IMPLANT
SUT VIC AB 3-0 SH 18 (SUTURE) IMPLANT
SUT VIC AB 3-0 SH 27 (SUTURE)
SUT VIC AB 3-0 SH 27XBRD (SUTURE) IMPLANT
SUT VIC AB 4-0 SH 18 (SUTURE) IMPLANT
SYR 20CC LL (SYRINGE) ×2 IMPLANT
TOWEL OR 17X26 10 PK STRL BLUE (TOWEL DISPOSABLE) ×2 IMPLANT
YANKAUER SUCT BULB TIP 10FT TU (MISCELLANEOUS) ×2 IMPLANT

## 2012-09-11 NOTE — H&P (Signed)
Subjective:       Patient ID: Tina Bird, female   DOB: Jun 10, 1951, 61 y.o.   MRN: [id number]   HPI   Tina Bird   [birth year] [id number]   Patient Care Team: Tresa Garter, MD as PCP - General Iva Boop, MD as Consulting Physician (Gastroenterology)   This patient is a 61 y.o.female who presents today for surgical evaluation.    Reason for evaluation: Persistent hemorrhoids.  Now prolapsing.   Pleasant elderly woman.  Struggles with loose bowels.  Not a more normal range.  Has had problems with hemorrhoidal discomfort.  I injected some mildly prolapsing hemorrhoids in 2008.  She tells me that works well for quite some time.  Began to get more symptomatic.  I injected again per her request in 2012.  I worked well until this year. She notes she is getting more prolapse.  Often has staining.  Discomfort.  Irritating at work.  She is now having to push the hemorrhoids in.  She usually has 3-4 smaller bowel movements in the morning.  We will have a bowel movement and then has the urge to continue to move them.  Never feels like she fully empties.   She did not want to consider surgery in the past.  However with them prolapsing more, she is wondering if it is time now.    Patient Active Problem List   Diagnosis   .  MENOPAUSAL SYNDROME   .  LOW BACK PAIN   .  DIZZINESS   .  HIP PAIN   .  Osteoarthritis   .  Hip pain, chronic   .  Glossal erythema   .  Cough   .  Asthmatic bronchitis   .  Internal hemorrhoids with prolapse & pain   .  IBS (irritable bowel syndrome)   .  Diverticulosis of colon with sigmoid stricture         Past Medical History   Diagnosis  Date   .  LBP (low back pain)     .  Vertigo     .  Menopausal symptoms         Dr. Elana Alm   .  Melanoma         Right leg and Right breast   .  Osteoarthritis  07/27/2011   .  Asthmatic bronchitis  03/07/2012   .  GERD (gastroesophageal reflux disease)           Past Surgical History    Procedure  Date   .  Cholecystectomy           History       Social History   .  Marital Status:  Married       Spouse Name:  N/A       Number of Children:  N/A   .  Years of Education:  N/A       Occupational History   .  Not on file.       Social History Main Topics   .  Smoking status:  Former Smoker -- 0.5 packs/day for 30 years       Types:  Cigarettes       Quit date:  10/04/2001   .  Smokeless tobacco:  Never Used   .  Alcohol Use:  No   .  Drug Use:  No   .  Sexually Active:  Not on file       Other Topics  Concern   .  Not on file       Social History Narrative     Regular Exercise- no         Family History   Problem  Relation  Age of Onset   .  Heart disease  Brother     .  Hypertension  Other     .  Alzheimer's disease  Other           Current Outpatient Prescriptions   Medication  Sig  Dispense  Refill   .  albuterol (PROVENTIL HFA) 108 (90 BASE) MCG/ACT inhaler  Inhale 2 puffs into the lungs every 6 (six) hours as needed for wheezing.   8.5 g   11   .  cetirizine (ZYRTEC) 10 MG tablet  Take 10 mg by mouth daily as needed.         .  Cholecalciferol 1000 UNITS tablet  Take 1,000 Units by mouth daily.           .  citalopram (CELEXA) 20 MG tablet  Take 1 tablet (20 mg total) by mouth daily.   30 tablet   5   .  escitalopram (LEXAPRO) 10 MG tablet  Take 10 mg by mouth daily.         .  famotidine (PEPCID) 20 MG tablet  One at bedtime   30 tablet   11   .  ibuprofen (ADVIL,MOTRIN) 600 MG tablet  Take 1 tablet (600 mg total) by mouth every 6 (six) hours as needed.   60 tablet   3   .  meclizine (ANTIVERT) 25 MG tablet  Take 12.5 mg by mouth 3 (three) times daily as needed.           [name]  omeprazole (PRILOSEC) 40 MG capsule  Take 1 capsule (40 mg total) by mouth daily.   30 capsule   3   .  pseudoephedrine (SUDAFED) 30 MG tablet  Take 30 mg by mouth every 4 (four) hours as needed.           .  vitamin B-12 (CYANOCOBALAMIN) 1000 MCG tablet  Take  1,000 mcg by mouth daily.         .  hydrocortisone-pramoxine (ANALPRAM-HC) 2.5-1 % rectal cream  Place rectally 4 (four) times daily. For irritated and painful hemorrhoids   15 g   2         Allergies   Allergen  Reactions   .  Fexofenadine Hcl          BP 148/88  Pulse 72  Temp 98 F (36.7 C) (Temporal)  Ht 5\' 5"  (1.651 m)  Wt 149 lb (67.586 kg)  BMI 24.79 kg/m2  SpO2 98%   No results found.     Review of Systems  Constitutional: Negative for fever, chills, diaphoresis, appetite change and fatigue.  HENT: Negative for ear pain, sore throat, trouble swallowing, neck pain and ear discharge.   Eyes: Negative for photophobia, discharge and visual disturbance.  Respiratory: Negative for cough, choking, chest tightness and shortness of breath.   Cardiovascular: Negative for chest pain and palpitations.  Gastrointestinal: Positive for diarrhea, anal bleeding and rectal pain. Negative for nausea, vomiting, abdominal pain and constipation.        No personal nor family history of GI/colon cancer, inflammatory bowel disease,    ? + irritable bowel syndrome   no allergy such as Celiac Sprue, dietary/dairy problems, colitis, ulcers nor gastritis.  No recent sick contacts/gastroenteritis.  No travel outside  the country.  No changes in diet.     Genitourinary: Negative for dysuria, frequency and difficulty urinating.  Musculoskeletal: Negative for myalgias and gait problem.  Skin: Negative for color change, pallor and rash.  Neurological: Negative for dizziness, speech difficulty, weakness and numbness.  Hematological: Negative for adenopathy.  Psychiatric/Behavioral: Negative for confusion and agitation. The patient is not nervous/anxious.           Objective:     Physical Exam  Constitutional: She is oriented to person, place, and time. She appears well-developed and well-nourished. No distress.  HENT:   Head: Normocephalic.   Mouth/Throat: Oropharynx is clear and  moist. No oropharyngeal exudate.  Eyes: Conjunctivae normal and EOM are normal. Pupils are equal, round, and reactive to light. No scleral icterus.  Neck: Normal range of motion. No tracheal deviation present.  Cardiovascular: Normal rate and intact distal pulses.   Pulmonary/Chest: Effort normal. No respiratory distress. She exhibits no tenderness.  Abdominal: Soft. Bowel sounds are normal. She exhibits no distension and no mass. There is no tenderness. There is no rebound and no guarding. Hernia confirmed negative in the right inguinal area and confirmed negative in the left inguinal area.       Incisions clean with normal healing ridges.  No hernias  Genitourinary: No vaginal discharge found.       Exam done with assistance of female Medical Assistant in the room.  Perianal skin clean with good hygiene.  No pruritis.  No external skin tags / hemorrhoids of significance.  No pilonidal disease.  No fissure.  No abscess/fistula.    Tolerates digital and anoscopic rectal exam.  Normal sphincter tone.  Hemorrhoidal piles enlarged R post>R ant >>L lateral.  R post/ant prolapse easily  Musculoskeletal: Normal range of motion. She exhibits no tenderness.  Lymphadenopathy:       Right: No inguinal adenopathy present.       Left: No inguinal adenopathy present.  Neurological: She is alert and oriented to person, place, and time. No cranial nerve deficit. She exhibits normal muscle tone. Coordination normal.  Skin: Skin is warm and dry. No rash noted. She is not diaphoretic.  Psychiatric: She has a normal mood and affect. Her behavior is normal.          Assessment:       Internal hemorrhoids with worsening prolapse.  Encopresis.  Prior injections.     Plan:        Because she has recurrent problems after two injections, and now she has prolapse, I think this requires surgery.  She will be a candidate for [facility].  She is now open to the idea of this.   The anatomy & physiology of the  anorectal region was discussed.  The pathophysiology of hemorrhoids and differential diagnosis was discussed.  Natural history risks without surgery was discussed.   I stressed the importance of a bowel regimen to have daily soft bowel movements to minimize progression of disease.  Interventions such as sclerotherapy & banding were discussed.   The patient's symptoms are not adequately controlled by medicines and other non-operative treatments.  I feel the risks & problems of no surgery outweigh the operative risks; therefore, I recommended surgery to treat the hemorrhoids by ligation, pexy, and possible resection.   Risks such as bleeding, infection, need for further treatment, heart attack, death, and other risks were discussed.   I noted a good likelihood this will help address the problem. Goals of post-operative recovery were discussed as  well.  Possibility that this will not correct all symptoms was explained.  Post-operative pain, bleeding, constipation, and other problems after surgery were discussed.  We will work to minimize complications.   Educational handouts further explaining the pathology, treatment options, and bowel regimen were given as well.  Questions were answered.  The patient expresses understanding & wishes to proceed with surgery.   I have re-reviewed the the patient's records, history, medications, and allergies.  I have re-examined the patient.  I again discussed intraoperative plans and goals of post-operative recovery.  The patient agrees to proceed.

## 2012-09-11 NOTE — Preoperative (Signed)
Beta Blockers   Reason not to administer Beta Blockers:Not Applicable 

## 2012-09-11 NOTE — Transfer of Care (Signed)
Immediate Anesthesia Transfer of Care Note  Patient: Tina Bird  Procedure(s) Performed: Procedure(s) (LRB) with comments: TRANSANAL HEMORRHOIDAL DEARTERIALIZATION (N/A) - THD hemorrhoidal ligation/pexy PEXY (N/A)  Patient Location: PACU  Anesthesia Type:General  Level of Consciousness: awake, alert  and oriented  Airway & Oxygen Therapy: Patient Spontanous Breathing and Patient connected to face mask oxygen  Post-op Assessment: Report given to PACU RN and Post -op Vital signs reviewed and stable  Post vital signs: Reviewed and stable  Complications: No apparent anesthesia complications

## 2012-09-11 NOTE — Anesthesia Postprocedure Evaluation (Signed)
  Anesthesia Post-op Note  Patient: Tina Bird  Procedure(s) Performed: Procedure(s) (LRB): TRANSANAL HEMORRHOIDAL DEARTERIALIZATION (N/A) PEXY (N/A)  Patient Location: PACU  Anesthesia Type: General  Level of Consciousness: awake and alert   Airway and Oxygen Therapy: Patient Spontanous Breathing  Post-op Pain: mild  Post-op Assessment: Post-op Vital signs reviewed, Patient's Cardiovascular Status Stable, Respiratory Function Stable, Patent Airway and No signs of Nausea or vomiting  Last Vitals:  Filed Vitals:   09/11/12 1017  BP: 144/74  Pulse: 94  Temp: 36.9 C  Resp: 12    Post-op Vital Signs: stable   Complications: No apparent anesthesia complications

## 2012-09-11 NOTE — Op Note (Signed)
09/11/2012  9:12 AM  PATIENT:  Tina Bird  61 y.o. female  Patient Care Team: Tresa Garter, MD as PCP - General Iva Boop, MD as Consulting Physician (Gastroenterology)  PRE-OPERATIVE DIAGNOSIS:  internal hemorrhoids with prolapse/pain  POST-OPERATIVE DIAGNOSIS:  internal hemorrhoids with prolapse/pain  PROCEDURE:  Procedure(s): TRANSANAL HEMORRHOIDAL DEARTERIALIZATION HEMORRHOIDOPEXY  (Hemorrhoidal ligation and sutured pexy using THD system)  SURGEON:  Surgeon(s): Ardeth Sportsman, MD  ANESTHESIA:   local and general  EBL:  Total I/O In: 600 [I.V.:600] Out: 50 [Blood:50]  Delay start of Pharmacological VTE agent (>24hrs) due to surgical blood loss or risk of bleeding:  no  DRAINS: none   SPECIMEN:  No Specimen  DISPOSITION OF SPECIMEN:  N/A  COUNTS:  YES  PLAN OF CARE: Discharge to home after PACU  PATIENT DISPOSITION:  PACU - hemodynamically stable.  INDICATION:   Pt w hemorrhoids worsening despite bowel regimen & 2 office Txs.  I recommended surgery:  The anatomy & physiology of the anorectal region was discussed.  The pathophysiology of hemorrhoids and differential diagnosis was discussed.  Natural history risks without surgery was discussed.   I stressed the importance of a bowel regimen to have daily soft bowel movements to minimize progression of disease.  Interventions such as sclerotherapy & banding were discussed.  The patient's symptoms are not adequately controlled by medicines and other non-operative treatments.  I feel the risks & problems of no surgery outweigh the operative risks; therefore, I recommended surgery to treat the hemorrhoids by ligation, pexy, and possible resection.  Risks such as bleeding, infection, need for further treatment, heart attack, death, and other risks were discussed.   I noted a good likelihood this will help address the problem.  Goals of post-operative recovery were discussed as well.  Possibility that this  will not correct all symptoms was explained.  Post-operative pain, bleeding, constipation, and other problems after surgery were discussed.  We will work to minimize complications.   Educational handouts further explaining the pathology, treatment options, and bowel regimen were given as well.  Questions were answered.  The patient expresses understanding & wishes to proceed with surgery.   OR FINDINGS: R post=Rant >Lat hemorrhoid swelling & intermittent prolapse  DESCRIPTION:   Informed consent was confirmed. Patient underwent general anesthesia without difficulty. Patient was placed into prone positioning.  The perianal region was prepped and draped in sterile fashion. Surgical tunnel confirmed or plan.  I did digital rectal examination and then transitioned over to anoscopy to get a sense of the anatomy.  I switched over to the [facility] fiberoptically lit Doppler anocope.   Using the Doppler on the tip of the THD anoscope, I identified the arterial hemorrhoidal vessels coming in in the classic hexagonal anatomical pattern (right posterior/lateral/anterior, left posterior /lateral/anterior).    I proceeded to ligate the hemorrhoidal arteries. I used a 2-0 Vicryl suture on a UR-6 needle in a figure-of-eight fashion over the signal around 6 cm proximal to the anal verge. I then ran that stitch longitudinally more distally to the white line of [place]. I then tied that stitch down to cause a hemorrhoidopexy. I did that for all 6 locations.    I redid Doppler anoscopy. I Identified a signal at the right posterior & left anterior locations.  I isolated and ligated this with a figure-of-eight stitch. Signals went away.  At completion of this, all hemorrhoids were reduced into the rectum. There is no more prolapse. External anatomy looked normal.  I repeated anoscopy and examination.  Hemostasis was good. Patient is being extubated go to recovery room.  I am about to discuss the patient's status to the family.

## 2012-09-11 NOTE — Anesthesia Procedure Notes (Signed)
Procedure Name: Intubation Date/Time: 09/11/2012 7:30 AM Performed by: Leroy Libman L Patient Re-evaluated:Patient Re-evaluated prior to inductionOxygen Delivery Method: Circle system utilized Preoxygenation: Pre-oxygenation with 100% oxygen Intubation Type: IV induction Ventilation: Mask ventilation without difficulty and Oral airway inserted - appropriate to patient size Laryngoscope Size: Hyacinth Meeker and 2 Grade View: Grade II Tube type: Oral Tube size: 7.5 mm Number of attempts: 1 Airway Equipment and Method: Stylet Placement Confirmation: ETT inserted through vocal cords under direct vision,  breath sounds checked- equal and bilateral and positive ETCO2 Secured at: 22 cm Tube secured with: Tape Dental Injury: Teeth and Oropharynx as per pre-operative assessment

## 2012-09-12 ENCOUNTER — Telehealth (INDEPENDENT_AMBULATORY_CARE_PROVIDER_SITE_OTHER): Payer: Self-pay | Admitting: General Surgery

## 2012-09-12 ENCOUNTER — Encounter (HOSPITAL_COMMUNITY): Payer: Self-pay | Admitting: Surgery

## 2012-09-12 NOTE — Telephone Encounter (Signed)
Pt called to confirm discharge instructions after hemorrhoid surgery yesterday. She is having rumblings in stomach and feels as if she needs to have a bowel movement/ I instructed her to have her bowel movement, no to hold back. She was also instructed  To start Miralax, I told her tomorrow would be ok to start if she has a normal bowel movement today. She asked about something to calm her stomach. I suggested she call her GI Dr since he manages her stomach medicine/gy

## 2012-09-12 NOTE — Telephone Encounter (Signed)
Spoke with patient - she is having urgency to have a bowel movement. After speaking with Dr Michaell Cowing made patient aware that feeling is normal with the sutures he places during surgery. Advised her to start miralax to have a bowel movement- she has not had one since surgery. Aware that feeling will slowly go away. She will call if no relief.

## 2012-09-12 NOTE — Telephone Encounter (Signed)
Message copied by Liliana Cline on Tue Sep 12, 2012  5:26 PM ------      Message from: Leanne Chang      Created: Tue Sep 12, 2012  2:11 PM      Regarding: Dr Leanne Chang: [phone]       Having urges to urinate. Doesn't want to speak with nurse, only Dr Michaell Cowing

## 2012-10-05 ENCOUNTER — Ambulatory Visit (INDEPENDENT_AMBULATORY_CARE_PROVIDER_SITE_OTHER): Payer: 59 | Admitting: Surgery

## 2012-10-05 ENCOUNTER — Encounter (INDEPENDENT_AMBULATORY_CARE_PROVIDER_SITE_OTHER): Payer: Self-pay | Admitting: Surgery

## 2012-10-05 VITALS — BP 124/76 | HR 74 | Temp 98.3°F | Resp 18 | Ht 65.0 in | Wt 145.4 lb

## 2012-10-05 DIAGNOSIS — K589 Irritable bowel syndrome without diarrhea: Secondary | ICD-10-CM

## 2012-10-05 DIAGNOSIS — K648 Other hemorrhoids: Secondary | ICD-10-CM

## 2012-10-05 NOTE — Progress Notes (Signed)
Subjective:     Patient ID: Tina Bird, female   DOB: 1951-04-02, 62 y.o.   MRN: [id number]  HPI  Tina Bird  03-19-1951 [id number]  Patient Care Team: Tresa Garter, MD as PCP - General Iva Boop, MD as Consulting Physician (Gastroenterology)  This patient is a 62 y.o.female who presents today for surgical evaluation.   PROCEDURE: Procedure(s):  (Hemorrhoidal ligation and sutured pexy using THD system) 09/11/2012  The patient comes in today feeling great.  She feels like her back anatomy is now back to normal.  "I wish I done this a year ago" and smiling.  No fevers or chills.  Energy level good.  BMs every day.  No irregular bowel movements.  No complaints of blood in bowel.  No fevers or chills.  Patient Active Problem List  Diagnosis  . MENOPAUSAL SYNDROME  . LOW BACK PAIN  . DIZZINESS  . HIP PAIN  . Osteoarthritis  . Hip pain, chronic  . Glossal erythema  . Cough  . Asthmatic bronchitis  . Internal hemorrhoids with prolapse & pain  . IBS (irritable bowel syndrome)  . Diverticulosis of colon with sigmoid stricture    Past Medical History  Diagnosis Date  . LBP (low back pain)   . Vertigo   . Menopausal symptoms     Dr. Elana Alm  . Melanoma     Right leg and Right breast  . GERD (gastroesophageal reflux disease)   . Asthmatic bronchitis 09-08-12    03-07-12 bronchits x1  . Osteoarthritis 07/27/2011    left knee    Past Surgical History  Procedure Date  . Cholecystectomy     laparoscopic  . Tonsillectomy   . Dilation and curettage of uterus   . Tubal ligation   . Transanal hemorrhoidal dearterialization 09/11/2012    Procedure: TRANSANAL HEMORRHOIDAL DEARTERIALIZATION;  Surgeon: Ardeth Sportsman, MD;  Location: [facility];  Service: General;  Laterality: N/A;  THD hemorrhoidal ligation/pexy  . Pexy 09/11/2012    Procedure: PEXY;  Surgeon: Ardeth Sportsman, MD;  Location: [facility];  Service: General;  Laterality: N/A;    History   Social History    . Marital Status: Married    Spouse Name: N/A    Number of Children: N/A  . Years of Education: N/A   Occupational History  . Not on file.   Social History Main Topics  . Smoking status: Former Smoker -- 0.5 packs/day for 30 years    Types: Cigarettes    Quit date: 10/04/2001  . Smokeless tobacco: Never Used  . Alcohol Use: 0.6 oz/week    1 Glasses of wine per week     Comment: rare wine  . Drug Use: No  . Sexually Active: Yes   Other Topics Concern  . Not on file   Social History Narrative   Regular Exercise- no    Family History  Problem Relation Age of Onset  . Heart disease Brother   . Hypertension Other   . Alzheimer's disease Other     Current Outpatient Prescriptions  Medication Sig Dispense Refill  . cetirizine (ZYRTEC) 10 MG tablet Take 10 mg by mouth daily as needed. allergies      . Cholecalciferol 1000 UNITS tablet Take 1,000 Units by mouth daily.        . citalopram (CELEXA) 20 MG tablet Take 20 mg by mouth every evening.      . famotidine (PEPCID) 20 MG tablet Take 20 mg by mouth  at bedtime.      . hydrocortisone-pramoxine (ANALPRAM-HC) 2.5-1 % rectal cream Place rectally 4 (four) times daily. For irritated and painful hemorrhoids  15 g  2  . ibuprofen (ADVIL,MOTRIN) 600 MG tablet Take 600 mg by mouth every 6 (six) hours as needed. Pain      . meclizine (ANTIVERT) 25 MG tablet Take 12.5 mg by mouth 3 (three) times daily as needed. verti      . omeprazole (PRILOSEC) 20 MG capsule Take 20 mg by mouth daily before breakfast.      . OVER THE COUNTER MEDICATION Take 1 tablet by mouth daily. [employer] ESTROGEN TABLET      . vitamin B-12 (CYANOCOBALAMIN) 1000 MCG tablet Take 1,000 mcg by mouth daily.         Allergies  Allergen Reactions  . Fexofenadine Hcl Other (See Comments)    Cant remember(Pt. Has no knowledge of being allergic to this or any thing)    BP 124/76  Pulse 74  Temp 98.3 F (36.8 C) (Temporal)  Resp 18  Ht 5\' 5"  (1.651 m)  Wt 145 lb 6 oz  (65.942 kg)  BMI 24.19 kg/m2  No results found.   Review of Systems  Constitutional: Negative for fever, chills and diaphoresis.  HENT: Negative for ear pain, sore throat and trouble swallowing.   Eyes: Negative for photophobia and visual disturbance.  Respiratory: Negative for cough and choking.   Cardiovascular: Negative for chest pain and palpitations.  Gastrointestinal: Negative for nausea, vomiting, abdominal pain, diarrhea, constipation, blood in stool, abdominal distention, anal bleeding and rectal pain.  Genitourinary: Negative for dysuria, frequency and difficulty urinating.  Musculoskeletal: Negative for myalgias and gait problem.  Skin: Negative for color change, pallor and rash.  Neurological: Negative for dizziness, speech difficulty, weakness and numbness.  Hematological: Negative for adenopathy.  Psychiatric/Behavioral: Negative for confusion and agitation. The patient is not nervous/anxious.        Objective:   Physical Exam  Constitutional: She is oriented to person, place, and time. She appears well-developed and well-nourished. No distress.  HENT:  Head: Normocephalic.  Mouth/Throat: Oropharynx is clear and moist. No oropharyngeal exudate.  Eyes: Conjunctivae normal and EOM are normal. Pupils are equal, round, and reactive to light. No scleral icterus.  Neck: Normal range of motion. No tracheal deviation present.  Cardiovascular: Normal rate and intact distal pulses.   Pulmonary/Chest: Effort normal. No respiratory distress. She exhibits no tenderness.  Abdominal: Soft. She exhibits no distension. There is no tenderness. Hernia confirmed negative in the right inguinal area and confirmed negative in the left inguinal area.       Incisions clean with normal healing ridges.  No hernias  Genitourinary: No vaginal discharge found.       Exam done with assistance of female Medical Assistant in the room.  Perianal skin clean with good hygiene.  No pruritis.  No  external skin tags / hemorrhoids of significance.  No pilonidal disease.  No fissure.  No abscess/fistula.    Normal sphincter tone.     Musculoskeletal: Normal range of motion. She exhibits no tenderness.  Lymphadenopathy:       Right: No inguinal adenopathy present.       Left: No inguinal adenopathy present.  Neurological: She is alert and oriented to person, place, and time. No cranial nerve deficit. She exhibits normal muscle tone. Coordination normal.  Skin: Skin is warm and dry. No rash noted. She is not diaphoretic.  Psychiatric: She has a normal  mood and affect. Her behavior is normal.       Assessment:     One month status post THD hemorrhoidal ligation and pexy, recovering well.    Plan:     Increase activity as tolerated to regular activity.  Do not push through pain.  Diet as tolerated. Bowel regimen to avoid problems.  The anatomy & physiology of the anorectal region was discussed.  The pathophysiology of hemorrhoids and differential diagnosis was discussed.  Natural history progression  was discussed.   I stressed the importance of a bowel regimen to have daily soft bowel movements to minimize progression of disease.   Goal of one BM / day ideal.  Use of wet wipes, warm baths, avoiding straining, etc were emphasized.  Return to clinic p.r.n.   Instructions discussed.  Followup with primary care physician for other health issues as would normally be done.  Questions answered. Educational handouts further explaining the pathology, treatment options, and bowel regimen were given as well.   The patient expressed understanding.

## 2012-10-05 NOTE — Patient Instructions (Signed)

## 2012-10-30 ENCOUNTER — Ambulatory Visit (INDEPENDENT_AMBULATORY_CARE_PROVIDER_SITE_OTHER): Payer: 59 | Admitting: Internal Medicine

## 2012-10-30 ENCOUNTER — Encounter: Payer: Self-pay | Admitting: Internal Medicine

## 2012-10-30 VITALS — BP 130/80 | HR 80 | Temp 99.1°F | Resp 16 | Ht 65.0 in | Wt 149.0 lb

## 2012-10-30 DIAGNOSIS — M25559 Pain in unspecified hip: Secondary | ICD-10-CM

## 2012-10-30 DIAGNOSIS — Z0000 Encounter for general adult medical examination without abnormal findings: Secondary | ICD-10-CM | POA: Insufficient documentation

## 2012-10-30 MED ORDER — VENLAFAXINE HCL ER 75 MG PO CP24: 75.0000 mg | ORAL_CAPSULE | Freq: Every day | ORAL | Status: DC

## 2012-10-30 MED ORDER — LORATADINE 10 MG PO TABS: 10.0000 mg | ORAL_TABLET | Freq: Every day | ORAL | Status: DC

## 2012-10-30 MED ORDER — VENLAFAXINE HCL ER 37.5 MG PO CP24: 37.5000 mg | ORAL_CAPSULE | Freq: Every day | ORAL | Status: DC

## 2012-10-30 MED ORDER — FAMOTIDINE 20 MG PO TABS: 20.0000 mg | ORAL_TABLET | Freq: Every day | ORAL | Status: DC

## 2012-10-30 NOTE — Progress Notes (Signed)
Subjective:     HPI  The patient is here for a wellness exam. The patient has been doing well overall without major physical or psychological issues going on lately. C/o hot flashes, allergies   Past Medical History  Diagnosis Date  . LBP (low back pain)   . Vertigo   . Menopausal symptoms     Dr. Elana Alm  . Melanoma     Right leg and Right breast  . GERD (gastroesophageal reflux disease)   . Asthmatic bronchitis 09-08-12    03-07-12 bronchits x1  . Osteoarthritis 07/27/2011    left knee    Past Surgical History  Procedure Date  . Cholecystectomy     laparoscopic  . Tonsillectomy   . Dilation and curettage of uterus   . Tubal ligation   . Transanal hemorrhoidal dearterialization 09/11/2012    Procedure: TRANSANAL HEMORRHOIDAL DEARTERIALIZATION;  Surgeon: Ardeth Sportsman, MD;  Location: [facility];  Service: General;  Laterality: N/A;  [place] hemorrhoidal ligation/pexy  . Pexy 09/11/2012    Procedure: PEXY;  Surgeon: Ardeth Sportsman, MD;  Location: [facility];  Service: General;  Laterality: N/A;    History   Social History  . Marital Status: Married    Spouse Name: N/A    Number of Children: N/A  . Years of Education: N/A   Occupational History  . Not on file.   Social History Main Topics  . Smoking status: Former Smoker -- 0.5 packs/day for 30 years    Types: Cigarettes    Quit date: 10/04/2001  . Smokeless tobacco: Never Used  . Alcohol Use: 0.6 oz/week    1 Glasses of wine per week     Comment: rare wine  . Drug Use: No  . Sexually Active: Yes   Other Topics Concern  . Not on file   Social History Narrative   Regular Exercise- no    Current Outpatient Prescriptions on File Prior to Visit  Medication Sig Dispense Refill  . cetirizine (ZYRTEC) 10 MG tablet Take 10 mg by mouth daily as needed. allergies      . Cholecalciferol 1000 UNITS tablet Take 1,000 Units by mouth daily.        . citalopram (CELEXA) 20 MG tablet Take 20 mg by mouth every evening.      .  famotidine (PEPCID) 20 MG tablet Take 20 mg by mouth at bedtime.      . hydrocortisone-pramoxine (ANALPRAM-HC) 2.5-1 % rectal cream Place rectally 4 (four) times daily. For irritated and painful hemorrhoids  15 g  2  . ibuprofen (ADVIL,MOTRIN) 600 MG tablet Take 600 mg by mouth every 6 (six) hours as needed. Pain      . meclizine (ANTIVERT) 25 MG tablet Take 12.5 mg by mouth 3 (three) times daily as needed. verti      . omeprazole (PRILOSEC) 20 MG capsule Take 20 mg by mouth daily before breakfast.      . OVER THE COUNTER MEDICATION Take 1 tablet by mouth daily. GNC ESTROGEN TABLET      . vitamin B-12 (CYANOCOBALAMIN) 1000 MCG tablet Take 1,000 mcg by mouth daily.        Allergies  Allergen Reactions  . Fexofenadine Hcl Other (See Comments)    Cant remember(Pt. Has no knowledge of being allergic to this or any thing)    Family History  Problem Relation Age of Onset  . Heart disease Brother   . Hypertension Other   . Alzheimer's disease Other  BP 130/80  Pulse 80  Temp 99.1 F (37.3 C) (Oral)  Resp 16  Ht 5\' 5"  (1.651 m)  Wt 149 lb (67.586 kg)  BMI 24.79 kg/m2  Review of Systems  Constitutional: Negative for fever, chills, diaphoresis, activity change, appetite change, fatigue and unexpected weight change.  HENT: Negative for hearing loss, ear pain, congestion, sore throat, sneezing, mouth sores, neck pain, dental problem, voice change, postnasal drip and sinus pressure.   Eyes: Negative for pain and visual disturbance.  Respiratory: Negative for cough, chest tightness, wheezing and stridor.   Cardiovascular: Negative for chest pain, palpitations and leg swelling.  Gastrointestinal: Negative for nausea, vomiting, abdominal pain, blood in stool, abdominal distention and rectal pain.  Genitourinary: Negative for dysuria, hematuria, decreased urine volume, vaginal bleeding, vaginal discharge, difficulty urinating, vaginal pain and menstrual problem.  Musculoskeletal: Negative  for back pain, joint swelling and gait problem.  Skin: Negative for color change, rash and wound.  Neurological: Negative for dizziness, tremors, syncope, speech difficulty and light-headedness.  Hematological: Negative for adenopathy.  Psychiatric/Behavioral: Negative for suicidal ideas, hallucinations, behavioral problems, confusion, sleep disturbance, dysphoric mood and decreased concentration. The patient is not hyperactive.        Objective:   Physical Exam  Constitutional: She appears well-developed and well-nourished. No distress.  HENT:  Head: Normocephalic.  Right Ear: External ear normal.  Left Ear: External ear normal.  Nose: Nose normal.  Mouth/Throat: Oropharynx is clear and moist. No oropharyngeal exudate.  Eyes: Conjunctivae normal are normal. Pupils are equal, round, and reactive to light. Right eye exhibits no discharge. Left eye exhibits no discharge.  Neck: Normal range of motion. Neck supple. No JVD present. No tracheal deviation present. No thyromegaly present.  Cardiovascular: Normal rate, regular rhythm and normal heart sounds.   Pulmonary/Chest: No stridor. No respiratory distress. She has no wheezes. She has no rales. She exhibits no tenderness.  Abdominal: Soft. Bowel sounds are normal. She exhibits no distension and no mass. There is no tenderness. There is no rebound and no guarding.  Musculoskeletal: She exhibits no edema and no tenderness.  Lymphadenopathy:    She has no cervical adenopathy.  Neurological: She displays normal reflexes. No cranial nerve deficit. She exhibits normal muscle tone. Coordination normal.  Skin: No rash noted. No erythema. No pallor.  Psychiatric: She has a normal mood and affect. Her behavior is normal. Judgment and thought content normal.     Lab Results  Component Value Date   WBC 4.8 09/06/2012   HGB 12.8 09/06/2012   HCT 38.1 09/06/2012   PLT 248 09/06/2012   GLUCOSE 80 06/10/2010   CHOL 199 06/10/2010   TRIG 120.0 06/10/2010     HDL 37.50* 06/10/2010   LDLCALC 138* 06/10/2010   ALT 21 06/10/2010   AST 22 06/10/2010   NA 140 06/10/2010   K 4.6 06/10/2010   CL 102 06/10/2010   CREATININE 0.7 06/10/2010   BUN 14 06/10/2010   CO2 30 06/10/2010   TSH 2.05 06/10/2010         Assessment & Plan:

## 2012-10-30 NOTE — Assessment & Plan Note (Signed)
We discussed age appropriate health related issues, including available/recomended screening tests and vaccinations. We discussed a need for adhering to healthy diet and exercise. Labs/EKG were reviewed/ordered. All questions were answered. PAP - Dr Elana Alm Mammo scheduled Vit D

## 2012-10-30 NOTE — Assessment & Plan Note (Signed)
She is seeing a chiropractor RTC if issues

## 2012-10-31 ENCOUNTER — Telehealth: Payer: Self-pay | Admitting: *Deleted

## 2012-10-31 NOTE — Telephone Encounter (Signed)
Screening mammo Dx: breast screen ca Thx

## 2012-10-31 NOTE — Telephone Encounter (Signed)
Order for diagnostic mammogram was placed for pt but diagnosis code is for routine medication exam. They want to know if pt is having a problem or if order needs to be changed to screening mammogram.

## 2012-11-01 NOTE — Telephone Encounter (Signed)
[facility] informed, will change to screening mammogram.

## 2012-11-13 ENCOUNTER — Other Ambulatory Visit (INDEPENDENT_AMBULATORY_CARE_PROVIDER_SITE_OTHER): Payer: 59

## 2012-11-13 DIAGNOSIS — Z0000 Encounter for general adult medical examination without abnormal findings: Secondary | ICD-10-CM

## 2012-11-13 LAB — CBC WITH DIFFERENTIAL/PLATELET
Eosinophils Relative: 4.4 % (ref 0.0–5.0)
HCT: 38.7 % (ref 36.0–46.0)
Lymphs Abs: 1.5 10*3/uL (ref 0.7–4.0)
MCV: 87.1 fl (ref 78.0–100.0)
Monocytes Absolute: 0.3 10*3/uL (ref 0.1–1.0)
Platelets: 239 10*3/uL (ref 150.0–400.0)
RDW: 13.3 % (ref 11.5–14.6)
WBC: 5.1 10*3/uL (ref 4.5–10.5)

## 2012-11-13 LAB — HEPATIC FUNCTION PANEL
AST: 19 U/L (ref 0–37)
Albumin: 4.1 g/dL (ref 3.5–5.2)
Alkaline Phosphatase: 88 U/L (ref 39–117)
Total Bilirubin: 0.6 mg/dL (ref 0.3–1.2)

## 2012-11-13 LAB — BASIC METABOLIC PANEL
BUN: 18 mg/dL (ref 6–23)
CO2: 30 mEq/L (ref 19–32)
Chloride: 101 mEq/L (ref 96–112)
Glucose, Bld: 92 mg/dL (ref 70–99)
Potassium: 4.2 mEq/L (ref 3.5–5.1)

## 2012-11-13 LAB — LIPID PANEL
Cholesterol: 207 mg/dL — ABNORMAL HIGH (ref 0–200)
Total CHOL/HDL Ratio: 5

## 2012-11-13 LAB — URINALYSIS
Bilirubin Urine: NEGATIVE
Ketones, ur: NEGATIVE
Total Protein, Urine: NEGATIVE
Urine Glucose: NEGATIVE
pH: 6 (ref 5.0–8.0)

## 2012-12-19 ENCOUNTER — Other Ambulatory Visit: Payer: Self-pay | Admitting: Internal Medicine

## 2013-08-03 ENCOUNTER — Other Ambulatory Visit: Payer: Self-pay | Admitting: Internal Medicine

## 2013-09-12 ENCOUNTER — Ambulatory Visit (INDEPENDENT_AMBULATORY_CARE_PROVIDER_SITE_OTHER): Payer: 59

## 2013-09-12 DIAGNOSIS — Z23 Encounter for immunization: Secondary | ICD-10-CM

## 2013-11-05 ENCOUNTER — Ambulatory Visit (INDEPENDENT_AMBULATORY_CARE_PROVIDER_SITE_OTHER): Payer: 59 | Admitting: Internal Medicine

## 2013-11-05 ENCOUNTER — Encounter: Payer: Self-pay | Admitting: Internal Medicine

## 2013-11-05 VITALS — BP 142/90 | HR 80 | Temp 98.9°F | Resp 16 | Wt 153.0 lb

## 2013-11-05 DIAGNOSIS — R55 Syncope and collapse: Secondary | ICD-10-CM

## 2013-11-05 DIAGNOSIS — R011 Cardiac murmur, unspecified: Secondary | ICD-10-CM | POA: Insufficient documentation

## 2013-11-05 LAB — GLUCOSE, POCT (MANUAL RESULT ENTRY): POC GLUCOSE: 107 mg/dL — AB (ref 70–99)

## 2013-11-05 NOTE — Progress Notes (Signed)
Pre visit review using our clinic review tool, if applicable. No additional management support is needed unless otherwise documented below in the visit note. 

## 2013-11-05 NOTE — Assessment & Plan Note (Addendum)
EKG Reduce Celexa to 1/2 tab a day ECHO Card ref Labs

## 2013-11-05 NOTE — Progress Notes (Signed)
Subjective:     HPI  C/o "near-syncope" sx's a couple times a day x 6 mo off and on, almost every day. No LOC  F/u hot flashes, allergies   Past Medical History  Diagnosis Date  . LBP (low back pain)   . Vertigo   . Menopausal symptoms     Dr. Ree Edman  . Melanoma     Right leg and Right breast  . GERD (gastroesophageal reflux disease)   . Asthmatic bronchitis 09-08-12    03-07-12 bronchits x1  . Osteoarthritis 07/27/2011    left knee    Past Surgical History  Procedure Laterality Date  . Cholecystectomy      laparoscopic  . Tonsillectomy    . Dilation and curettage of uterus    . Tubal ligation    . Transanal hemorrhoidal dearterialization  09/11/2012    Procedure: TRANSANAL HEMORRHOIDAL DEARTERIALIZATION;  Surgeon: Adin Hector, MD;  Location: [facility];  Service: General;  Laterality: N/A;  [place] hemorrhoidal ligation/pexy  . Pexy  09/11/2012    Procedure: PEXY;  Surgeon: Adin Hector, MD;  Location: [facility];  Service: General;  Laterality: N/A;    History   Social History  . Marital Status: Married    Spouse Name: N/A    Number of Children: N/A  . Years of Education: N/A   Occupational History  . Not on file.   Social History Main Topics  . Smoking status: Former Smoker -- 0.50 packs/day for 30 years    Types: Cigarettes    Quit date: 10/04/2001  . Smokeless tobacco: Never Used  . Alcohol Use: 0.6 oz/week    1 Glasses of wine per week     Comment: rare wine  . Drug Use: No  . Sexual Activity: Yes   Other Topics Concern  . Not on file   Social History Narrative   Regular Exercise- no    Current Outpatient Prescriptions on File Prior to Visit  Medication Sig Dispense Refill  . Cholecalciferol 1000 UNITS tablet Take 1,000 Units by mouth daily.        . citalopram (CELEXA) 20 MG tablet take 1 tablet by mouth once daily  90 tablet  3  . meclizine (ANTIVERT) 25 MG tablet Take 12.5 mg by mouth 3 (three) times daily as needed. verti      . omeprazole  (PRILOSEC) 20 MG capsule Take 20 mg by mouth daily before breakfast.      . vitamin B-12 (CYANOCOBALAMIN) 1000 MCG tablet Take 1,000 mcg by mouth daily.      . famotidine (PEPCID) 20 MG tablet Take 1 tablet (20 mg total) by mouth at bedtime.  90 tablet  3  . hydrocortisone-pramoxine (ANALPRAM-HC) 2.5-1 % rectal cream Place rectally 4 (four) times daily. For irritated and painful hemorrhoids  15 g  2  . ibuprofen (ADVIL,MOTRIN) 600 MG tablet Take 600 mg by mouth every 6 (six) hours as needed. Pain      . loratadine (CLARITIN) 10 MG tablet Take 1 tablet (10 mg total) by mouth daily.  100 tablet  3  . OVER THE COUNTER MEDICATION Take 1 tablet by mouth daily. GNC ESTROGEN TABLET      . venlafaxine XR (EFFEXOR XR) 75 MG 24 hr capsule Take 1 capsule (75 mg total) by mouth daily.  30 capsule  5   No current facility-administered medications on file prior to visit.    Allergies  Allergen Reactions  . Fexofenadine Hcl Other (See Comments)  Cant remember(Pt. Has no knowledge of being allergic to this or any thing)    Family History  Problem Relation Age of Onset  . Heart disease Brother   . Hypertension Other   . Alzheimer's disease Other   . Diabetes Mother   . Hypertension Mother     BP 152/80  Pulse 80  Temp(Src) 98.9 F (37.2 C) (Oral)  Resp 16  Wt 153 lb (69.4 kg)  Review of Systems  Constitutional: Negative for fever, chills, diaphoresis, activity change, appetite change, fatigue and unexpected weight change.  HENT: Negative for congestion, dental problem, ear pain, hearing loss, mouth sores, postnasal drip, sinus pressure, sneezing, sore throat and voice change.   Eyes: Negative for pain and visual disturbance.  Respiratory: Negative for cough, chest tightness, wheezing and stridor.   Cardiovascular: Negative for chest pain, palpitations and leg swelling.  Gastrointestinal: Negative for nausea, vomiting, abdominal pain, blood in stool, abdominal distention and rectal pain.   Genitourinary: Negative for dysuria, hematuria, decreased urine volume, vaginal bleeding, vaginal discharge, difficulty urinating, vaginal pain and menstrual problem.  Musculoskeletal: Negative for back pain, gait problem, joint swelling and neck pain.  Skin: Negative for color change, rash and wound.  Neurological: Negative for dizziness, tremors, syncope, speech difficulty and light-headedness.  Hematological: Negative for adenopathy.  Psychiatric/Behavioral: Negative for suicidal ideas, hallucinations, behavioral problems, confusion, sleep disturbance, dysphoric mood and decreased concentration. The patient is not hyperactive.        Objective:   Physical Exam  Constitutional: She appears well-developed and well-nourished. No distress.  HENT:  Head: Normocephalic.  Right Ear: External ear normal.  Left Ear: External ear normal.  Nose: Nose normal.  Mouth/Throat: Oropharynx is clear and moist. No oropharyngeal exudate.  Eyes: Conjunctivae are normal. Pupils are equal, round, and reactive to light. Right eye exhibits no discharge. Left eye exhibits no discharge.  Neck: Normal range of motion. Neck supple. No JVD present. No tracheal deviation present. No thyromegaly present.  Cardiovascular: Normal rate, regular rhythm and normal heart sounds.   Pulmonary/Chest: No stridor. No respiratory distress. She has no wheezes. She has no rales. She exhibits no tenderness.  Abdominal: Soft. Bowel sounds are normal. She exhibits no distension and no mass. There is no tenderness. There is no rebound and no guarding.  Musculoskeletal: She exhibits no edema and no tenderness.  Lymphadenopathy:    She has no cervical adenopathy.  Neurological: She displays normal reflexes. No cranial nerve deficit. She exhibits normal muscle tone. Coordination normal.  Skin: No rash noted. No erythema. No pallor.  Psychiatric: She has a normal mood and affect. Her behavior is normal. Judgment and thought content  normal.     Lab Results  Component Value Date   WBC 5.1 11/13/2012   HGB 13.1 11/13/2012   HCT 38.7 11/13/2012   PLT 239.0 11/13/2012   GLUCOSE 92 11/13/2012   CHOL 207* 11/13/2012   TRIG 117.0 11/13/2012   HDL 40.00 11/13/2012   LDLDIRECT 138.5 11/13/2012   LDLCALC 138* 06/10/2010   ALT 17 11/13/2012   AST 19 11/13/2012   NA 137 11/13/2012   K 4.2 11/13/2012   CL 101 11/13/2012   CREATININE 0.7 11/13/2012   BUN 18 11/13/2012   CO2 30 11/13/2012   TSH 1.59 11/13/2012    A complex case     Assessment & Plan:

## 2013-11-05 NOTE — Patient Instructions (Signed)
Take Citalopram 1/2 tab a day

## 2013-11-05 NOTE — Assessment & Plan Note (Signed)
ECHO

## 2013-11-13 ENCOUNTER — Ambulatory Visit: Payer: 59 | Admitting: Cardiology

## 2013-11-14 ENCOUNTER — Other Ambulatory Visit (HOSPITAL_COMMUNITY): Payer: 59

## 2013-12-03 ENCOUNTER — Other Ambulatory Visit (HOSPITAL_COMMUNITY): Payer: 59

## 2014-01-16 ENCOUNTER — Other Ambulatory Visit: Payer: Self-pay | Admitting: Internal Medicine

## 2014-04-18 ENCOUNTER — Other Ambulatory Visit: Payer: Self-pay | Admitting: *Deleted

## 2014-04-18 MED ORDER — OMEPRAZOLE 40 MG PO CPDR: 40.0000 mg | DELAYED_RELEASE_CAPSULE | Freq: Every day | ORAL | Status: DC

## 2014-07-26 ENCOUNTER — Ambulatory Visit (INDEPENDENT_AMBULATORY_CARE_PROVIDER_SITE_OTHER): Payer: 59

## 2014-07-26 DIAGNOSIS — Z23 Encounter for immunization: Secondary | ICD-10-CM

## 2014-09-18 ENCOUNTER — Other Ambulatory Visit: Payer: Self-pay | Admitting: Internal Medicine

## 2014-10-08 ENCOUNTER — Telehealth: Payer: Self-pay | Admitting: Internal Medicine

## 2014-10-08 ENCOUNTER — Other Ambulatory Visit (INDEPENDENT_AMBULATORY_CARE_PROVIDER_SITE_OTHER): Payer: 59

## 2014-10-08 ENCOUNTER — Other Ambulatory Visit: Payer: Self-pay | Admitting: Internal Medicine

## 2014-10-08 ENCOUNTER — Ambulatory Visit (INDEPENDENT_AMBULATORY_CARE_PROVIDER_SITE_OTHER): Payer: 59 | Admitting: Internal Medicine

## 2014-10-08 ENCOUNTER — Encounter: Payer: Self-pay | Admitting: Internal Medicine

## 2014-10-08 VITALS — BP 144/90 | HR 91 | Temp 98.5°F | Ht 65.0 in | Wt 153.0 lb

## 2014-10-08 DIAGNOSIS — K5730 Diverticulosis of large intestine without perforation or abscess without bleeding: Secondary | ICD-10-CM

## 2014-10-08 DIAGNOSIS — R1030 Lower abdominal pain, unspecified: Secondary | ICD-10-CM

## 2014-10-08 DIAGNOSIS — Z86010 Personal history of colonic polyps: Secondary | ICD-10-CM | POA: Insufficient documentation

## 2014-10-08 LAB — URINALYSIS
Bilirubin Urine: NEGATIVE
Hgb urine dipstick: NEGATIVE
KETONES UR: NEGATIVE
Leukocytes, UA: NEGATIVE
Nitrite: NEGATIVE
PH: 6.5 (ref 5.0–8.0)
SPECIFIC GRAVITY, URINE: 1.02 (ref 1.000–1.030)
TOTAL PROTEIN, URINE-UPE24: NEGATIVE
UROBILINOGEN UA: 0.2 (ref 0.0–1.0)
Urine Glucose: NEGATIVE

## 2014-10-08 LAB — CBC WITH DIFFERENTIAL/PLATELET
Basophils Absolute: 0 10*3/uL (ref 0.0–0.1)
Basophils Relative: 0.6 % (ref 0.0–3.0)
EOS ABS: 0.2 10*3/uL (ref 0.0–0.7)
EOS PCT: 3.2 % (ref 0.0–5.0)
HCT: 37.8 % (ref 36.0–46.0)
Hemoglobin: 12.7 g/dL (ref 12.0–15.0)
LYMPHS PCT: 28.2 % (ref 12.0–46.0)
Lymphs Abs: 1.7 10*3/uL (ref 0.7–4.0)
MCHC: 33.7 g/dL (ref 30.0–36.0)
MCV: 86 fl (ref 78.0–100.0)
Monocytes Absolute: 0.4 10*3/uL (ref 0.1–1.0)
Monocytes Relative: 6 % (ref 3.0–12.0)
NEUTROS PCT: 62 % (ref 43.0–77.0)
Neutro Abs: 3.7 10*3/uL (ref 1.4–7.7)
Platelets: 269 10*3/uL (ref 150.0–400.0)
RBC: 4.39 Mil/uL (ref 3.87–5.11)
RDW: 13 % (ref 11.5–15.5)
WBC: 6 10*3/uL (ref 4.0–10.5)

## 2014-10-08 MED ORDER — METRONIDAZOLE 500 MG PO TABS: 500.0000 mg | ORAL_TABLET | Freq: Three times a day (TID) | ORAL | Status: DC

## 2014-10-08 MED ORDER — TRAMADOL HCL 50 MG PO TABS: 50.0000 mg | ORAL_TABLET | Freq: Four times a day (QID) | ORAL | Status: DC | PRN

## 2014-10-08 MED ORDER — CIPROFLOXACIN HCL 500 MG PO TABS: 500.0000 mg | ORAL_TABLET | Freq: Two times a day (BID) | ORAL | Status: DC

## 2014-10-08 NOTE — Telephone Encounter (Signed)
Pt called back upset she has waited all day for results and whether or not to fill meds/prescription that was handed to her. As per Dr Linna Darner labs look normal and will send pt results and go fill her meds. Pt states its "too cold to go back out and can MD she just call in".

## 2014-10-08 NOTE — Telephone Encounter (Signed)
Pt requesting a call back as waiting on whether she needs to fill her prescription or not. [phone]

## 2014-10-08 NOTE — Patient Instructions (Signed)
Stay on clear liquids for 48-72 hours or until pain gone.This would include  jello, sherbert (NOT ice cream), [employer] chicken noodle soup(NOT cream based soups),Gatorade Lite, flat Ginger ale (without High Fructose Corn Syrup),dry toast or crackers, baked potato.No milk , dairy or grease . Align , a [employer] , daily if stools are loose. Immodium AD for frankly watery stool. Report increasing pain, fever or rectal bleeding

## 2014-10-08 NOTE — Progress Notes (Signed)
Pre visit review using our clinic review tool, if applicable. No additional management support is needed unless otherwise documented below in the visit note. 

## 2014-10-08 NOTE — Progress Notes (Signed)
   Subjective:    Patient ID: Tina Bird, female    DOB: 1951/08/19, 64 y.o.   MRN: [id number]  HPI   She describes symptoms for approximately 2 weeks. This is described as cramping , "gas like"discomfort in the suprapubic area . Over the last week this has become constant  She does have dyspepsia which is chronic and unrelated  Colonoscopy in October 2011 revealed severe diverticulosis and 2 hyperplastic polyps  She's had an cholecystectomy.  She has not had abdominal hysterectomy or salpingo-oophorectomy. Her gynecologic follow-up is overdue.    Review of Systems She specifically denies nausea, vomiting, hematemesis, constipation, diarrhea, melena, rectal bleeding  She has no dysphagia  She's had no anorexia or loss of weight  There's been no associated chills, sweats  She denies dysuria, pyuria, hematuria  He has no associated back pain with anterior radiation  She denies any vaginal discharge or bleeding.     Objective:   Physical Exam  Pertinent or positive findings include: She has a grade 1/2 systolic murmur She's tender to palpation over the suprapubic area  General appearance :adequately nourished; in no distress. Eyes: No conjunctival inflammation or scleral icterus is present. Oral exam: Dental hygiene is good. Lips and gums are healthy appearing.There is no oropharyngeal erythema or exudate noted.  Heart:  Normal rate and regular rhythm. S1 and S2 normal without gallop, click, rub or other extra sounds   Lungs:Chest clear to auscultation; no wheezes, rhonchi,rales ,or rubs present.No increased work of breathing.  Abdomen: bowel sounds normal, soft without masses, organomegaly or hernias noted.  No guarding or rebound. No flank tenderness to percussion. Vascular : all pulses equal ; no bruits present. Skin:Warm & dry.  Intact without suspicious lesions or rashes ; no jaundice or tenting Lymphatic: No lymphadenopathy is noted about the head, neck,  axilla.          Assessment & Plan:  #1 suprapubic abdominal pain, constant and gas like  #2 history of severe sigmoid diverticulosis #3 history of hyperplastic polyps, diminutive in 2011 Low threshold for ultrasound of the uterus and ovaries if no better with medication

## 2015-04-22 ENCOUNTER — Ambulatory Visit (INDEPENDENT_AMBULATORY_CARE_PROVIDER_SITE_OTHER): Payer: Medicare Other | Admitting: Internal Medicine

## 2015-04-22 ENCOUNTER — Encounter: Payer: Self-pay | Admitting: Internal Medicine

## 2015-04-22 VITALS — BP 140/80 | HR 77 | Wt 153.0 lb

## 2015-04-22 DIAGNOSIS — R21 Rash and other nonspecific skin eruption: Secondary | ICD-10-CM

## 2015-04-22 DIAGNOSIS — R55 Syncope and collapse: Secondary | ICD-10-CM | POA: Diagnosis not present

## 2015-04-22 MED ORDER — CLOTRIMAZOLE-BETAMETHASONE 1-0.05 % EX CREA: 1.0000 "application " | TOPICAL_CREAM | Freq: Two times a day (BID) | CUTANEOUS | Status: DC

## 2015-04-22 NOTE — Progress Notes (Signed)
Pre visit review using our clinic review tool, if applicable. No additional management support is needed unless otherwise documented below in the visit note. 

## 2015-04-22 NOTE — Addendum Note (Signed)
Addended by: Cassandria Anger on: 04/22/2015 09:43 AM   Modules accepted: Orders

## 2015-04-22 NOTE — Assessment & Plan Note (Signed)
L prox anterior thigh rash ?etiol Lotrisone x 3-4 weeks

## 2015-04-22 NOTE — Assessment & Plan Note (Signed)
Rare D/c celexa

## 2015-04-22 NOTE — Progress Notes (Signed)
Subjective:  Patient ID: Tina Bird, female    DOB: [birth year]  Age: 64 y.o. MRN: [id number]  CC: No chief complaint on file.   HPI Tina Bird presents for L prox anterior thigh rash x 6 months ?fungus Question about talcum powder use and cancer F/u nearsyncope  Outpatient Prescriptions Prior to Visit  Medication Sig Dispense Refill  . Cholecalciferol 1000 UNITS tablet Take 1,000 Units by mouth daily.      . citalopram (CELEXA) 20 MG tablet take 1 tablet by mouth once daily (Patient taking differently: take 1/2 tablet by mouth once daily) 90 tablet 0  . vitamin B-12 (CYANOCOBALAMIN) 1000 MCG tablet Take 1,000 mcg by mouth daily.    . ciprofloxacin (CIPRO) 500 MG tablet Take 1 tablet (500 mg total) by mouth 2 (two) times daily. 20 tablet 0  . famotidine (PEPCID) 20 MG tablet Take 1 tablet (20 mg total) by mouth at bedtime. (Patient not taking: Reported on 04/22/2015) 90 tablet 3  . ibuprofen (ADVIL,MOTRIN) 600 MG tablet Take 600 mg by mouth every 6 (six) hours as needed. Pain    . meclizine (ANTIVERT) 25 MG tablet Take 12.5 mg by mouth 3 (three) times daily as needed. verti    . omeprazole (PRILOSEC) 40 MG capsule Take 1 capsule (40 mg total) by mouth daily. (Patient not taking: Reported on 04/22/2015) 90 capsule 2  . OVER THE COUNTER MEDICATION Take 1 tablet by mouth daily. GNC ESTROGEN TABLET    . traMADol (ULTRAM) 50 MG tablet Take 1 tablet (50 mg total) by mouth every 6 (six) hours as needed. (Patient not taking: Reported on 04/22/2015) 30 tablet 0  . metroNIDAZOLE (FLAGYL) 500 MG tablet Take 1 tablet (500 mg total) by mouth 3 (three) times daily. (Patient not taking: Reported on 04/22/2015) 21 tablet 0   No facility-administered medications prior to visit.    ROS Review of Systems  Constitutional: Negative for fever and diaphoresis.  Respiratory: Negative for chest tightness.   Skin: Positive for rash. Negative for wound.  Neurological: Positive for light-headedness.    Psychiatric/Behavioral: Negative for sleep disturbance. The patient is not nervous/anxious.     Objective:  BP 140/80 mmHg  Pulse 77  Wt 153 lb (69.4 kg)  SpO2 97%  BP Readings from Last 3 Encounters:  04/22/15 140/80  10/08/14 144/90  11/05/13 142/90    Wt Readings from Last 3 Encounters:  04/22/15 153 lb (69.4 kg)  10/08/14 153 lb (69.4 kg)  11/05/13 153 lb (69.4 kg)    Physical Exam  Constitutional: No distress.  Cardiovascular: Normal heart sounds.  Exam reveals no gallop and no friction rub.   No murmur heard. Musculoskeletal: She exhibits no tenderness.  Skin: No pallor.  Psychiatric: She has a normal mood and affect. Her behavior is normal.    L prox anterior thigh eryth papular rash 2x1 cm Lab Results  Component Value Date   WBC 6.0 10/08/2014   HGB 12.7 10/08/2014   HCT 37.8 10/08/2014   PLT 269.0 10/08/2014   GLUCOSE 92 11/13/2012   CHOL 207* 11/13/2012   TRIG 117.0 11/13/2012   HDL 40.00 11/13/2012   LDLDIRECT 138.5 11/13/2012   LDLCALC 138* 06/10/2010   ALT 17 11/13/2012   AST 19 11/13/2012   NA 137 11/13/2012   K 4.2 11/13/2012   CL 101 11/13/2012   CREATININE 0.7 11/13/2012   BUN 18 11/13/2012   CO2 30 11/13/2012   TSH 1.59 11/13/2012    No results  found.  Assessment & Plan:   Diagnoses and all orders for this visit:  Rash and nonspecific skin eruption  Near syncope  Other orders -     clotrimazole-betamethasone (LOTRISONE) cream; Apply 1 application topically 2 (two) times daily.   I have discontinued Ms. Wright's metroNIDAZOLE and ciprofloxacin. I am also having her start on clotrimazole-betamethasone. Additionally, I am having her maintain her meclizine, Cholecalciferol, vitamin B-12, ibuprofen, OVER THE COUNTER MEDICATION, famotidine, omeprazole, citalopram, and traMADol.  Meds ordered this encounter  Medications  . clotrimazole-betamethasone (LOTRISONE) cream    Sig: Apply 1 application topically 2 (two) times daily.     Dispense:  15 g    Refill:  1   Baby powder use - stop using baby powder  Follow-up: Return in about 2 months (around 06/23/2015) for a follow-up visit.  Walker Kehr, MD

## 2015-06-05 ENCOUNTER — Telehealth: Payer: Self-pay | Admitting: *Deleted

## 2015-06-05 MED ORDER — OMEPRAZOLE 40 MG PO CPDR: 40.0000 mg | DELAYED_RELEASE_CAPSULE | Freq: Every day | ORAL | Status: DC

## 2015-06-05 NOTE — Telephone Encounter (Signed)
Receive call pt is needing refills on her omeprazole. Verified pharmacy inform pt will send to [facility]...Johny Chess

## 2015-06-18 ENCOUNTER — Other Ambulatory Visit: Payer: Self-pay | Admitting: Internal Medicine

## 2015-07-02 ENCOUNTER — Ambulatory Visit (INDEPENDENT_AMBULATORY_CARE_PROVIDER_SITE_OTHER): Payer: Medicare Other | Admitting: *Deleted

## 2015-07-02 DIAGNOSIS — Z23 Encounter for immunization: Secondary | ICD-10-CM

## 2016-01-13 ENCOUNTER — Telehealth: Payer: Self-pay | Admitting: Internal Medicine

## 2016-01-15 ENCOUNTER — Encounter (HOSPITAL_BASED_OUTPATIENT_CLINIC_OR_DEPARTMENT_OTHER): Payer: Self-pay | Admitting: Emergency Medicine

## 2016-01-15 ENCOUNTER — Telehealth: Payer: Self-pay | Admitting: Internal Medicine

## 2016-01-15 ENCOUNTER — Emergency Department (HOSPITAL_BASED_OUTPATIENT_CLINIC_OR_DEPARTMENT_OTHER)
Admission: EM | Admit: 2016-01-15 | Discharge: 2016-01-15 | Disposition: A | Discharge status: Home / Self Care | Payer: Medicare Other | Attending: Emergency Medicine | Admitting: Emergency Medicine

## 2016-01-15 DIAGNOSIS — Z853 Personal history of malignant neoplasm of breast: Secondary | ICD-10-CM | POA: Diagnosis not present

## 2016-01-15 DIAGNOSIS — R1030 Lower abdominal pain, unspecified: Secondary | ICD-10-CM | POA: Diagnosis not present

## 2016-01-15 DIAGNOSIS — Z85820 Personal history of malignant melanoma of skin: Secondary | ICD-10-CM | POA: Diagnosis not present

## 2016-01-15 DIAGNOSIS — Z760 Encounter for issue of repeat prescription: Secondary | ICD-10-CM | POA: Insufficient documentation

## 2016-01-15 DIAGNOSIS — J45909 Unspecified asthma, uncomplicated: Secondary | ICD-10-CM | POA: Insufficient documentation

## 2016-01-15 DIAGNOSIS — Z79899 Other long term (current) drug therapy: Secondary | ICD-10-CM | POA: Diagnosis not present

## 2016-01-15 DIAGNOSIS — N3000 Acute cystitis without hematuria: Secondary | ICD-10-CM

## 2016-01-15 DIAGNOSIS — Z87891 Personal history of nicotine dependence: Secondary | ICD-10-CM | POA: Insufficient documentation

## 2016-01-15 DIAGNOSIS — R1013 Epigastric pain: Secondary | ICD-10-CM | POA: Diagnosis not present

## 2016-01-15 LAB — CBC WITH DIFFERENTIAL/PLATELET
BASOS ABS: 0 10*3/uL (ref 0.0–0.1)
Basophils Relative: 1 %
EOS ABS: 0.2 10*3/uL (ref 0.0–0.7)
EOS PCT: 3 %
HCT: 39.5 % (ref 36.0–46.0)
Hemoglobin: 13.4 g/dL (ref 12.0–15.0)
LYMPHS PCT: 28 %
Lymphs Abs: 1.5 10*3/uL (ref 0.7–4.0)
MCH: 29.1 pg (ref 26.0–34.0)
MCHC: 33.9 g/dL (ref 30.0–36.0)
MCV: 85.7 fL (ref 78.0–100.0)
MONO ABS: 0.3 10*3/uL (ref 0.1–1.0)
Monocytes Relative: 6 %
Neutro Abs: 3.5 10*3/uL (ref 1.7–7.7)
Neutrophils Relative %: 62 %
PLATELETS: 221 10*3/uL (ref 150–400)
RBC: 4.61 MIL/uL (ref 3.87–5.11)
RDW: 12.8 % (ref 11.5–15.5)
WBC: 5.5 10*3/uL (ref 4.0–10.5)

## 2016-01-15 LAB — URINE MICROSCOPIC-ADD ON

## 2016-01-15 LAB — URINALYSIS, ROUTINE W REFLEX MICROSCOPIC
BILIRUBIN URINE: NEGATIVE
GLUCOSE, UA: NEGATIVE mg/dL
HGB URINE DIPSTICK: NEGATIVE
KETONES UR: NEGATIVE mg/dL
Nitrite: NEGATIVE
PROTEIN: NEGATIVE mg/dL
Specific Gravity, Urine: 1.017 (ref 1.005–1.030)
pH: 5.5 (ref 5.0–8.0)

## 2016-01-15 LAB — LIPASE, BLOOD: LIPASE: 13 U/L (ref 11–51)

## 2016-01-15 LAB — COMPREHENSIVE METABOLIC PANEL
ALT: 20 U/L (ref 14–54)
AST: 21 U/L (ref 15–41)
Albumin: 4.4 g/dL (ref 3.5–5.0)
Alkaline Phosphatase: 86 U/L (ref 38–126)
Anion gap: 8 (ref 5–15)
BUN: 17 mg/dL (ref 6–20)
CHLORIDE: 101 mmol/L (ref 101–111)
CO2: 29 mmol/L (ref 22–32)
Calcium: 9.2 mg/dL (ref 8.9–10.3)
Creatinine, Ser: 0.77 mg/dL (ref 0.44–1.00)
Glucose, Bld: 99 mg/dL (ref 65–99)
Potassium: 3.8 mmol/L (ref 3.5–5.1)
SODIUM: 138 mmol/L (ref 135–145)
Total Bilirubin: 0.5 mg/dL (ref 0.3–1.2)
Total Protein: 7.4 g/dL (ref 6.5–8.1)

## 2016-01-15 MED ORDER — ESCITALOPRAM OXALATE 10 MG PO TABS: 10.0000 mg | ORAL_TABLET | Freq: Every day | ORAL | Status: DC

## 2016-01-15 MED ORDER — CEPHALEXIN 500 MG PO CAPS: 500.0000 mg | ORAL_CAPSULE | Freq: Two times a day (BID) | ORAL | Status: DC

## 2016-01-15 MED FILL — CEPHALEXIN 500 MG CAPSULE: 500 | 7 days supply | Qty: 14 | Fill #0

## 2016-01-15 MED FILL — ESCITALOPRAM 10 MG TABLET: 10 | 14 days supply | Qty: 14 | Fill #0

## 2016-01-15 NOTE — ED Provider Notes (Signed)
CSN: [id number]     Arrival date & time 01/15/16  1016 History   First MD Initiated Contact with Patient 01/15/16 1029     Chief Complaint  Patient presents with  . Abdominal Pain     (Consider location/radiation/quality/duration/timing/severity/associated sxs/prior Treatment) HPI Comments: Patient with history of cholecystectomy presents with complaint of abdominal pain. Patient had reflux symptoms typical for her 4 days ago. This is described as burning in the epigastrium and chest with radiation to the back. Patient typically takes Prilosec however has been noncompliant recently. The symptoms resolved however patient has had a residual soreness in her back that is worse with movement. She also has intermittent abdominal cramping, severe last night, in the lower abdomen that kept her from sleep. No associated fevers, vomiting, diarrhea. No urinary symptoms. No blood in stool or black stools. No treatments PTA. No h/o heart problems, SOB. CP. The onset of this condition was acute. Aggravating factors: none. Alleviating factors: none. History of severe diverticulosis on previous screening colonoscopy.    Patient is a 65 y.o. female presenting with abdominal pain. The history is provided by the patient.  Abdominal Pain Associated symptoms: no chest pain, no cough, no diarrhea, no dysuria, no fever, no nausea, no shortness of breath, no sore throat and no vomiting     Past Medical History  Diagnosis Date  . LBP (low back pain)   . Vertigo   . Menopausal symptoms     Dr. Ree Edman  . Melanoma ([place])     Right leg and Right breast  . GERD (gastroesophageal reflux disease)   . Asthmatic bronchitis 09-08-12    03-07-12 bronchits x1  . Osteoarthritis 07/27/2011    left knee   Past Surgical History  Procedure Laterality Date  . Cholecystectomy      laparoscopic  . Tonsillectomy    . Dilation and curettage of uterus    . Tubal ligation    . Transanal hemorrhoidal dearterialization  09/11/2012     Procedure: TRANSANAL HEMORRHOIDAL DEARTERIALIZATION;  Surgeon: Adin Hector, MD;  Location: [facility];  Service: General;  Laterality: N/A;  [place] hemorrhoidal ligation/pexy  . Pexy  09/11/2012    Procedure: PEXY;  Surgeon: Adin Hector, MD;  Location: [facility];  Service: General;  Laterality: N/A;   Family History  Problem Relation Age of Onset  . Heart disease Brother   . Hypertension Other   . Alzheimer's disease Other   . Diabetes Mother   . Hypertension Mother    Social History  Substance Use Topics  . Smoking status: Former Smoker -- 0.50 packs/day for 30 years    Types: Cigarettes    Quit date: 10/04/2001  . Smokeless tobacco: Never Used  . Alcohol Use: 0.6 oz/week    1 Glasses of wine per week     Comment: rare wine   OB History    No data available     Review of Systems  Constitutional: Negative for fever.  HENT: Negative for rhinorrhea and sore throat.   Eyes: Negative for redness.  Respiratory: Negative for cough and shortness of breath.   Cardiovascular: Negative for chest pain.  Gastrointestinal: Positive for abdominal pain. Negative for nausea, vomiting, diarrhea and blood in stool.  Genitourinary: Negative for dysuria.  Musculoskeletal: Positive for back pain. Negative for myalgias.  Skin: Negative for rash.  Neurological: Negative for headaches.      Allergies  Review of patient's allergies indicates no known allergies.  Home Medications   Prior  to Admission medications   Medication Sig Start Date End Date Taking? Authorizing Provider  Cholecalciferol 1000 UNITS tablet Take 1,000 Units by mouth daily.     Yes Historical Provider, MD  omeprazole (PRILOSEC) 40 MG capsule Take 1 capsule (40 mg total) by mouth daily. 06/05/15  Yes Aleksei Plotnikov V, MD  vitamin B-12 (CYANOCOBALAMIN) 1000 MCG tablet Take 1,000 mcg by mouth daily.   Yes Historical Provider, MD  clotrimazole-betamethasone (LOTRISONE) cream Apply 1 application topically 2 (two) times  daily. 04/22/15   Aleksei Plotnikov V, MD  famotidine (PEPCID) 20 MG tablet Take 1 tablet (20 mg total) by mouth at bedtime. Patient not taking: Reported on 04/22/2015 10/30/12   Lew Dawes V, MD  ibuprofen (ADVIL,MOTRIN) 600 MG tablet Take 600 mg by mouth every 6 (six) hours as needed. Pain    Historical Provider, MD  meclizine (ANTIVERT) 25 MG tablet Take 12.5 mg by mouth 3 (three) times daily as needed. verti    Historical Provider, MD   BP 156/86 mmHg  Pulse 75  Temp(Src) 98.7 F (37.1 C) (Oral)  Resp 20  Ht 5\' 5"  (1.651 m)  Wt 70.308 kg  BMI 25.79 kg/m2  SpO2 100%   Physical Exam  Constitutional: She appears well-developed and well-nourished.  HENT:  Head: Normocephalic and atraumatic.  Eyes: Conjunctivae are normal. Right eye exhibits no discharge. Left eye exhibits no discharge.  Neck: Normal range of motion. Neck supple.  Cardiovascular: Normal rate, regular rhythm and normal heart sounds.   No murmur heard. Pulmonary/Chest: Breath sounds normal. No respiratory distress. She has no wheezes. She has no rales.  Abdominal: Soft. There is tenderness (mild lower, mid-line). There is no rebound and no guarding.  Neurological: She is alert.  Skin: Skin is warm and dry.  Psychiatric: She has a normal mood and affect.  Nursing note and vitals reviewed.   ED Course  Procedures (including critical care time) Labs Review Labs Reviewed  URINALYSIS, ROUTINE W REFLEX MICROSCOPIC (NOT AT [facility]) - Abnormal; Notable for the following:    Leukocytes, UA MODERATE (*)    All other components within normal limits  URINE MICROSCOPIC-ADD ON - Abnormal; Notable for the following:    Squamous Epithelial / LPF 0-5 (*)    Bacteria, UA MANY (*)    Casts HYALINE CASTS (*)    All other components within normal limits  CBC WITH DIFFERENTIAL/PLATELET  COMPREHENSIVE METABOLIC PANEL  LIPASE, BLOOD     EKG Interpretation   Date/Time:  Thursday January 15 2016 11:13:48 EDT Ventricular Rate:   70 PR Interval:  182 QRS Duration: 88 QT Interval:  407 QTC Calculation: 439 R Axis:   40 Text Interpretation:  Sinus rhythm Nonspecific T abnormalities, lateral  leads Baseline wander in lead(s) V6 Confirmed by Hazle Coca [phone]) on  01/15/2016 11:24:50 AM       11:01 AM Patient seen and examined. Work-up initiated.    Vital signs reviewed and are as follows: BP 156/86 mmHg  Pulse 75  Temp(Src) 98.7 F (37.1 C) (Oral)  Resp 20  Ht 5\' 5"  (1.651 m)  Wt 70.308 kg  BMI 25.79 kg/m2  SpO2 100%  Patient updated on lab results. Patient discussed with and seen by Dr. Ralene Bathe. Abdominal exam unchanged during ED stay.   Patient does have suggestion of UTI on UA. We discussed 2 different plans of action at this point. One is to treat UTI and have patient monitor symptoms closely at home, return with worsening, and follow-up with  PCP if not improved. Second is to perform CT scan at this time to rule out other intra-abdominal etiology. Patient is comfortable with and opts for discharge to home at this time a treatment for UTI. She is excited to pick up her sister from the hospital tomorrow and does not want to miss this. She is willing to return with any change or worsening of her symptoms.  The patient was urged to return to the Emergency Department immediately with worsening of current symptoms, worsening abdominal pain, persistent vomiting, blood noted in stools, fever, or any other concerns. The patient verbalized understanding.   Patient is going to see her PCP next week for recheck and to refill Lexapro. She asked if I could refill this prior to PCP follow-up. I wrote for #14 Lexapro 10mg .   MDM   Final diagnoses:  Suprapubic abdominal pain, unspecified laterality  Acute cystitis without hematuria  Medication refill   Patient with reflux symptoms, epigastric pain, lower abdominal pain. Labs are normal today. UA suggests UTI. Patient does have suprapubic pain and describes spasm pain  which might be bladder spasms. EKG performed does not show any concerning signs for ischemia. Symptoms are atypical for ACS and patient does not have a concerning history for this. Discussed workup and treatment options as above. Patient seems reliable to return with worsening.    Carlisle Cater, PA-C 01/15/16 [place], MD 01/16/16 0700

## 2016-01-15 NOTE — ED Notes (Signed)
md at bedside

## 2016-01-15 NOTE — Discharge Instructions (Signed)
Please read and follow all provided instructions.  Your diagnoses today include:  1. Suprapubic abdominal pain, unspecified laterality   2. Acute cystitis without hematuria   3. Medication refill     Tests performed today include:  Blood counts and electrolytes - normal  Blood tests to check liver and kidney function - normal  Blood tests to check pancreas function - normal  Urine test to look for infection - shows probable urine infection  Vital signs. See below for your results today.   Medications prescribed:   Keflex (cephalexin) - antibiotic  You have been prescribed an antibiotic medicine: take the entire course of medicine even if you are feeling better. Stopping early can cause the antibiotic not to work.   Lexapro - medication for depression   Take any prescribed medications only as directed.  Home care instructions:   Follow any educational materials contained in this packet.  Follow-up instructions: Please follow-up with your primary care provider in the next 5 days for further evaluation of your symptoms.    Return instructions:  SEEK IMMEDIATE MEDICAL ATTENTION IF:  The pain does not go away or becomes severe   A temperature above 101F develops   Repeated vomiting occurs (multiple episodes)   The pain becomes localized to portions of the abdomen. The right side could possibly be appendicitis. In an adult, the left lower portion of the abdomen could be colitis or diverticulitis.   Blood is being passed in stools or vomit (bright red or black tarry stools)   You develop chest pain, difficulty breathing, dizziness or fainting, or become confused, poorly responsive, or inconsolable (young children)  If you have any other emergent concerns regarding your health  Additional Information: Abdominal (belly) pain can be caused by many things. Your caregiver performed an examination and possibly ordered blood/urine tests and imaging (CT scan, x-rays,  ultrasound). Many cases can be observed and treated at home after initial evaluation in the emergency department. Even though you are being discharged home, abdominal pain can be unpredictable. Therefore, you need a repeated exam if your pain does not resolve, returns, or worsens. Most patients with abdominal pain don't have to be admitted to the hospital or have surgery, but serious problems like appendicitis and gallbladder attacks can start out as nonspecific pain. Many abdominal conditions cannot be diagnosed in one visit, so follow-up evaluations are very important.  Your vital signs today were: BP 156/86 mmHg   Pulse 75   Temp(Src) 98.7 F (37.1 C) (Oral)   Resp 20   Ht 5\' 5"  (1.651 m)   Wt 70.308 kg   BMI 25.79 kg/m2   SpO2 100% If your blood pressure (bp) was elevated above 135/85 this visit, please have this repeated by your doctor within one month. --------------

## 2016-01-15 NOTE — Telephone Encounter (Signed)
PCP informed of below. He advises go to ER as this sounds very serious. Pt informed

## 2016-01-15 NOTE — ED Notes (Signed)
Had severe heart burn on Sunday evening, then Developed lower abdominal cramping on Sunday that has been severe at times, today pain has radiated to back,

## 2016-01-15 NOTE — Telephone Encounter (Signed)
PLEASE NOTE: All timestamps contained within this report are represented as [place] Standard Time. CONFIDENTIALTY NOTICE: This fax transmission is intended only for the addressee. It contains information that is legally privileged, confidential or otherwise protected from use or disclosure. If you are not the intended recipient, you are strictly prohibited from reviewing, disclosing, copying using or disseminating any of this information or taking any action in reliance on or regarding this information. If you have received this fax in error, please notify us immediately by telephone so that we can arrange for its return to [place]. Phone: [phone], Toll-Free: [phone], Fax: [phone] Page: 1 of 1 Call Id: [id number] [employer] [facility] - Client [place] [facility] T DOB: 13-Sep-1951 Initial Comment Caller states she is having severe abd pain. Nurse Assessment Nurse: Marcelline Deist, RN, [place] Date/Time (Eastern Time): 01/15/2016 8:40:09 AM Confirm and document reason for call. If symptomatic, describe symptoms. You must click the next button to save text entered. ---Caller states she is having severe lower abdominal pain/spasms with back pain. Had severe acid reflux Sunday night which seemed to start her symptoms. No fever. Kept her awake all night. Has the patient traveled out of the country within the last 30 days? ---Not Applicable Does the patient have any new or worsening symptoms? ---Yes Will a triage be completed? ---Yes Related visit to physician within the last 2 weeks? ---No Does the PT have any chronic conditions? (i.e. diabetes, asthma, etc.) ---Yes List chronic conditions. ---acid reflux Is this a behavioral health or substance abuse call? ---No Guidelines Guideline Title Affirmed Question Affirmed Notes Abdominal Pain - Female [1] SEVERE pain (e.g., excruciating) AND [2] present > 1 hour Final  Disposition User Go to ED Now Marcelline Deist, RN, [place] does not want to go to the hospital. States she would like something called in if possible. 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 Allergic to OTC products, did not specify. States she also has been bloated, looks 6 months pregnant. Nurse will call office and let them know that caller does not want to be seen at ER. Spoke with Marzetta Board at office & advised that patient does not want to come in to hospital. Referrals [place] UNDECIDED Disagree/Comply: Comply

## 2016-01-20 ENCOUNTER — Encounter: Payer: Medicare Other | Admitting: Internal Medicine

## 2016-01-25 ENCOUNTER — Encounter (HOSPITAL_BASED_OUTPATIENT_CLINIC_OR_DEPARTMENT_OTHER): Payer: Self-pay | Admitting: *Deleted

## 2016-01-25 ENCOUNTER — Emergency Department (HOSPITAL_COMMUNITY): Payer: Medicare Other | Admitting: Anesthesiology

## 2016-01-25 ENCOUNTER — Ambulatory Visit (HOSPITAL_BASED_OUTPATIENT_CLINIC_OR_DEPARTMENT_OTHER)
Admission: EM | Admit: 2016-01-25 | Discharge: 2016-01-28 | Disposition: A | Discharge status: Home / Self Care | Payer: Medicare Other | Attending: General Surgery | Admitting: General Surgery

## 2016-01-25 ENCOUNTER — Encounter (HOSPITAL_COMMUNITY)
Admission: EM | Disposition: A | Discharge status: Home / Self Care | Payer: Self-pay | Source: Home / Self Care | Attending: Emergency Medicine

## 2016-01-25 ENCOUNTER — Emergency Department (HOSPITAL_BASED_OUTPATIENT_CLINIC_OR_DEPARTMENT_OTHER): Payer: Medicare Other

## 2016-01-25 DIAGNOSIS — M545 Low back pain: Secondary | ICD-10-CM | POA: Diagnosis not present

## 2016-01-25 DIAGNOSIS — R42 Dizziness and giddiness: Secondary | ICD-10-CM | POA: Diagnosis not present

## 2016-01-25 DIAGNOSIS — M1712 Unilateral primary osteoarthritis, left knee: Secondary | ICD-10-CM | POA: Insufficient documentation

## 2016-01-25 DIAGNOSIS — Z85820 Personal history of malignant melanoma of skin: Secondary | ICD-10-CM | POA: Insufficient documentation

## 2016-01-25 DIAGNOSIS — R109 Unspecified abdominal pain: Secondary | ICD-10-CM | POA: Diagnosis present

## 2016-01-25 DIAGNOSIS — Z87891 Personal history of nicotine dependence: Secondary | ICD-10-CM | POA: Insufficient documentation

## 2016-01-25 DIAGNOSIS — K3580 Unspecified acute appendicitis: Secondary | ICD-10-CM | POA: Diagnosis not present

## 2016-01-25 DIAGNOSIS — Z79899 Other long term (current) drug therapy: Secondary | ICD-10-CM | POA: Diagnosis not present

## 2016-01-25 DIAGNOSIS — K219 Gastro-esophageal reflux disease without esophagitis: Secondary | ICD-10-CM | POA: Diagnosis not present

## 2016-01-25 DIAGNOSIS — K353 Acute appendicitis with localized peritonitis, without perforation or gangrene: Secondary | ICD-10-CM

## 2016-01-25 DIAGNOSIS — K449 Diaphragmatic hernia without obstruction or gangrene: Secondary | ICD-10-CM | POA: Diagnosis not present

## 2016-01-25 HISTORY — PX: LAPAROSCOPIC APPENDECTOMY: SHX408

## 2016-01-25 LAB — CBC WITH DIFFERENTIAL/PLATELET
BASOS ABS: 0 10*3/uL (ref 0.0–0.1)
BASOS PCT: 0 %
EOS ABS: 0.2 10*3/uL (ref 0.0–0.7)
Eosinophils Relative: 3 %
HEMATOCRIT: 35.6 % — AB (ref 36.0–46.0)
HEMOGLOBIN: 11.9 g/dL — AB (ref 12.0–15.0)
Lymphocytes Relative: 24 %
Lymphs Abs: 1.6 10*3/uL (ref 0.7–4.0)
MCH: 29.5 pg (ref 26.0–34.0)
MCHC: 33.4 g/dL (ref 30.0–36.0)
MCV: 88.3 fL (ref 78.0–100.0)
Monocytes Absolute: 0.5 10*3/uL (ref 0.1–1.0)
Monocytes Relative: 8 %
NEUTROS ABS: 4.5 10*3/uL (ref 1.7–7.7)
Neutrophils Relative %: 65 %
Platelets: 230 10*3/uL (ref 150–400)
RBC: 4.03 MIL/uL (ref 3.87–5.11)
RDW: 12.6 % (ref 11.5–15.5)
WBC: 6.8 10*3/uL (ref 4.0–10.5)

## 2016-01-25 LAB — CBC
HEMATOCRIT: 34.3 % — AB (ref 36.0–46.0)
HEMOGLOBIN: 11.1 g/dL — AB (ref 12.0–15.0)
MCH: 28.5 pg (ref 26.0–34.0)
MCHC: 32.4 g/dL (ref 30.0–36.0)
MCV: 88.2 fL (ref 78.0–100.0)
Platelets: 187 10*3/uL (ref 150–400)
RBC: 3.89 MIL/uL (ref 3.87–5.11)
RDW: 12.8 % (ref 11.5–15.5)
WBC: 6.4 10*3/uL (ref 4.0–10.5)

## 2016-01-25 LAB — URINE MICROSCOPIC-ADD ON

## 2016-01-25 LAB — URINALYSIS, ROUTINE W REFLEX MICROSCOPIC
Bilirubin Urine: NEGATIVE
Glucose, UA: NEGATIVE mg/dL
Hgb urine dipstick: NEGATIVE
KETONES UR: NEGATIVE mg/dL
NITRITE: NEGATIVE
PROTEIN: NEGATIVE mg/dL
Specific Gravity, Urine: 1.023 (ref 1.005–1.030)
pH: 6 (ref 5.0–8.0)

## 2016-01-25 LAB — BASIC METABOLIC PANEL
ANION GAP: 10 (ref 5–15)
BUN: 17 mg/dL (ref 6–20)
CALCIUM: 8.6 mg/dL — AB (ref 8.9–10.3)
CO2: 24 mmol/L (ref 22–32)
CREATININE: 0.72 mg/dL (ref 0.44–1.00)
Chloride: 104 mmol/L (ref 101–111)
GLUCOSE: 98 mg/dL (ref 65–99)
Potassium: 3.9 mmol/L (ref 3.5–5.1)
Sodium: 138 mmol/L (ref 135–145)

## 2016-01-25 SURGERY — APPENDECTOMY, LAPAROSCOPIC
Anesthesia: General | Site: Abdomen

## 2016-01-25 MED ORDER — EPHEDRINE SULFATE 50 MG/ML IJ SOLN
INTRAMUSCULAR | Status: AC
  Filled 2016-01-25: qty 1

## 2016-01-25 MED ORDER — IBUPROFEN 400 MG PO TABS: 400.0000 mg | ORAL_TABLET | Freq: Four times a day (QID) | ORAL | Status: DC | PRN

## 2016-01-25 MED ORDER — LIDOCAINE HCL (CARDIAC) 20 MG/ML IV SOLN
INTRAVENOUS | Status: AC
  Filled 2016-01-25: qty 5

## 2016-01-25 MED ORDER — PROPOFOL 10 MG/ML IV BOLUS
INTRAVENOUS | Status: DC | PRN
  Administered 2016-01-25: 110 mg via INTRAVENOUS

## 2016-01-25 MED ORDER — METRONIDAZOLE IN NACL 5-0.79 MG/ML-% IV SOLN
500.0000 mg | Freq: Once | INTRAVENOUS | Status: AC
  Administered 2016-01-25: 500 mg via INTRAVENOUS
  Filled 2016-01-25: qty 100

## 2016-01-25 MED ORDER — KETOROLAC TROMETHAMINE 15 MG/ML IJ SOLN
15.0000 mg | Freq: Four times a day (QID) | INTRAMUSCULAR | Status: DC
  Administered 2016-01-26 – 2016-01-28 (×10): 15 mg via INTRAVENOUS
  Filled 2016-01-25 (×10): qty 1

## 2016-01-25 MED ORDER — SIMETHICONE 80 MG PO CHEW: 40.0000 mg | CHEWABLE_TABLET | Freq: Four times a day (QID) | ORAL | Status: DC | PRN

## 2016-01-25 MED ORDER — HYDRALAZINE HCL 20 MG/ML IJ SOLN: 10.0000 mg | INTRAMUSCULAR | Status: DC | PRN

## 2016-01-25 MED ORDER — OXYCODONE HCL 5 MG PO TABS
5.0000 mg | ORAL_TABLET | ORAL | Status: DC | PRN
  Administered 2016-01-26 (×2): 5 mg via ORAL
  Filled 2016-01-25 (×2): qty 1

## 2016-01-25 MED ORDER — HYDROMORPHONE HCL 1 MG/ML IJ SOLN
INTRAMUSCULAR | Status: AC
  Filled 2016-01-25: qty 1

## 2016-01-25 MED ORDER — PIPERACILLIN-TAZOBACTAM 3.375 G IVPB 30 MIN
3.3750 g | Freq: Once | INTRAVENOUS | Status: AC
  Administered 2016-01-26: 3.375 g via INTRAVENOUS
  Filled 2016-01-25: qty 50

## 2016-01-25 MED ORDER — KCL IN DEXTROSE-NACL 20-5-0.45 MEQ/L-%-% IV SOLN
INTRAVENOUS | Status: DC
  Administered 2016-01-25: 23:00:00 via INTRAVENOUS
  Filled 2016-01-25 (×2): qty 1000

## 2016-01-25 MED ORDER — ZOLPIDEM TARTRATE 5 MG PO TABS
5.0000 mg | ORAL_TABLET | Freq: Every evening | ORAL | Status: DC | PRN
  Administered 2016-01-26: 5 mg via ORAL
  Filled 2016-01-25: qty 1

## 2016-01-25 MED ORDER — SODIUM CHLORIDE 0.9 % IV BOLUS (SEPSIS)
500.0000 mL | Freq: Once | INTRAVENOUS | Status: AC
  Administered 2016-01-25: 500 mL via INTRAVENOUS

## 2016-01-25 MED ORDER — PIPERACILLIN-TAZOBACTAM 3.375 G IVPB 30 MIN
3.3750 g | Freq: Once | INTRAVENOUS | Status: AC
  Administered 2016-01-25: 3.375 g via INTRAVENOUS
  Filled 2016-01-25 (×2): qty 50

## 2016-01-25 MED ORDER — SENNOSIDES-DOCUSATE SODIUM 8.6-50 MG PO TABS
1.0000 | ORAL_TABLET | Freq: Every evening | ORAL | Status: DC | PRN
Start: 2016-01-25 — End: 2016-01-28

## 2016-01-25 MED ORDER — DEXAMETHASONE SODIUM PHOSPHATE 4 MG/ML IJ SOLN
INTRAMUSCULAR | Status: DC | PRN
  Administered 2016-01-25: 4 mg via INTRAVENOUS

## 2016-01-25 MED ORDER — ONDANSETRON HCL 4 MG/2ML IJ SOLN
4.0000 mg | Freq: Four times a day (QID) | INTRAMUSCULAR | Status: DC | PRN
  Administered 2016-01-26: 4 mg via INTRAVENOUS
  Filled 2016-01-25: qty 2

## 2016-01-25 MED ORDER — ROCURONIUM BROMIDE 100 MG/10ML IV SOLN
INTRAVENOUS | Status: DC | PRN
  Administered 2016-01-25: 50 mg via INTRAVENOUS

## 2016-01-25 MED ORDER — LIDOCAINE HCL (CARDIAC) 20 MG/ML IV SOLN
INTRAVENOUS | Status: DC | PRN
  Administered 2016-01-25: 60 mg via INTRAVENOUS

## 2016-01-25 MED ORDER — SODIUM CHLORIDE 0.9 % IJ SOLN
INTRAMUSCULAR | Status: AC
  Filled 2016-01-25: qty 10

## 2016-01-25 MED ORDER — SUFENTANIL CITRATE 50 MCG/ML IV SOLN
INTRAVENOUS | Status: AC
  Filled 2016-01-25: qty 1

## 2016-01-25 MED ORDER — METHOCARBAMOL 500 MG PO TABS
500.0000 mg | ORAL_TABLET | Freq: Four times a day (QID) | ORAL | Status: DC | PRN
  Administered 2016-01-26: 500 mg via ORAL
  Filled 2016-01-25: qty 1

## 2016-01-25 MED ORDER — MORPHINE SULFATE (PF) 2 MG/ML IV SOLN: 1.0000 mg | INTRAVENOUS | Status: DC | PRN

## 2016-01-25 MED ORDER — LIDOCAINE HCL 1 % IJ SOLN
INTRAMUSCULAR | Status: DC | PRN
  Administered 2016-01-25: 13 mL

## 2016-01-25 MED ORDER — DIPHENHYDRAMINE HCL 12.5 MG/5ML PO ELIX: 12.5000 mg | ORAL_SOLUTION | Freq: Four times a day (QID) | ORAL | Status: DC | PRN

## 2016-01-25 MED ORDER — DIPHENHYDRAMINE HCL 50 MG/ML IJ SOLN: 12.5000 mg | Freq: Four times a day (QID) | INTRAMUSCULAR | Status: DC | PRN

## 2016-01-25 MED ORDER — SUFENTANIL CITRATE 50 MCG/ML IV SOLN
INTRAVENOUS | Status: DC | PRN
  Administered 2016-01-25: 15 ug via INTRAVENOUS
  Administered 2016-01-25: 10 ug via INTRAVENOUS

## 2016-01-25 MED ORDER — PROMETHAZINE HCL 25 MG/ML IJ SOLN
6.2500 mg | Freq: Once | INTRAMUSCULAR | Status: AC
  Administered 2016-01-25: 6.25 mg via INTRAVENOUS
  Filled 2016-01-25: qty 1

## 2016-01-25 MED ORDER — SUCCINYLCHOLINE CHLORIDE 20 MG/ML IJ SOLN
INTRAMUSCULAR | Status: AC
  Filled 2016-01-25: qty 1

## 2016-01-25 MED ORDER — LIDOCAINE HCL 1 % IJ SOLN: INTRAMUSCULAR | Status: DC | PRN

## 2016-01-25 MED ORDER — ACETAMINOPHEN 325 MG PO TABS: 650.0000 mg | ORAL_TABLET | Freq: Four times a day (QID) | ORAL | Status: DC | PRN

## 2016-01-25 MED ORDER — SUGAMMADEX SODIUM 200 MG/2ML IV SOLN
INTRAVENOUS | Status: AC
  Filled 2016-01-25: qty 2

## 2016-01-25 MED ORDER — ONDANSETRON HCL 4 MG/2ML IJ SOLN
INTRAMUSCULAR | Status: AC
  Filled 2016-01-25: qty 2

## 2016-01-25 MED ORDER — SUGAMMADEX SODIUM 200 MG/2ML IV SOLN
INTRAVENOUS | Status: DC | PRN
  Administered 2016-01-25: 200 mg via INTRAVENOUS

## 2016-01-25 MED ORDER — MIDAZOLAM HCL 5 MG/5ML IJ SOLN
INTRAMUSCULAR | Status: DC | PRN
  Administered 2016-01-25: 2 mg via INTRAVENOUS

## 2016-01-25 MED ORDER — MECLIZINE HCL 12.5 MG PO TABS
12.5000 mg | ORAL_TABLET | Freq: Three times a day (TID) | ORAL | Status: DC | PRN
  Filled 2016-01-25: qty 1

## 2016-01-25 MED ORDER — PIPERACILLIN-TAZOBACTAM 3.375 G IVPB
3.3750 g | Freq: Three times a day (TID) | INTRAVENOUS | Status: DC
  Administered 2016-01-26 – 2016-01-28 (×7): 3.375 g via INTRAVENOUS
  Filled 2016-01-25 (×9): qty 50

## 2016-01-25 MED ORDER — ONDANSETRON 4 MG PO TBDP: 4.0000 mg | ORAL_TABLET | Freq: Four times a day (QID) | ORAL | Status: DC | PRN

## 2016-01-25 MED ORDER — SUCCINYLCHOLINE CHLORIDE 20 MG/ML IJ SOLN
INTRAMUSCULAR | Status: DC | PRN
  Administered 2016-01-25: 100 mg via INTRAVENOUS

## 2016-01-25 MED ORDER — MIDAZOLAM HCL 2 MG/2ML IJ SOLN
INTRAMUSCULAR | Status: AC
  Filled 2016-01-25: qty 2

## 2016-01-25 MED ORDER — EPHEDRINE SULFATE 50 MG/ML IJ SOLN
INTRAMUSCULAR | Status: DC | PRN
  Administered 2016-01-25: 10 mg via INTRAVENOUS

## 2016-01-25 MED ORDER — PANTOPRAZOLE SODIUM 40 MG PO TBEC
40.0000 mg | DELAYED_RELEASE_TABLET | Freq: Every day | ORAL | Status: DC
  Administered 2016-01-26 – 2016-01-28 (×3): 40 mg via ORAL
  Filled 2016-01-25 (×3): qty 1

## 2016-01-25 MED ORDER — BUPIVACAINE-EPINEPHRINE (PF) 0.25% -1:200000 IJ SOLN
INTRAMUSCULAR | Status: AC
  Filled 2016-01-25: qty 30

## 2016-01-25 MED ORDER — IOPAMIDOL (ISOVUE-300) INJECTION 61%
100.0000 mL | Freq: Once | INTRAVENOUS | Status: AC | PRN
  Administered 2016-01-25: 100 mL via INTRAVENOUS

## 2016-01-25 MED ORDER — CLOTRIMAZOLE 1 % EX CREA
TOPICAL_CREAM | Freq: Two times a day (BID) | CUTANEOUS | Status: DC
  Filled 2016-01-25: qty 15

## 2016-01-25 MED ORDER — ACETAMINOPHEN 650 MG RE SUPP: 650.0000 mg | Freq: Four times a day (QID) | RECTAL | Status: DC | PRN

## 2016-01-25 MED ORDER — HYDROMORPHONE HCL 1 MG/ML IJ SOLN
0.2500 mg | INTRAMUSCULAR | Status: DC | PRN
  Administered 2016-01-25 (×2): 0.5 mg via INTRAVENOUS

## 2016-01-25 MED ORDER — ONDANSETRON HCL 4 MG/2ML IJ SOLN
INTRAMUSCULAR | Status: DC | PRN
  Administered 2016-01-25: 4 mg via INTRAVENOUS

## 2016-01-25 MED ORDER — PROMETHAZINE HCL 25 MG/ML IJ SOLN
INTRAMUSCULAR | Status: AC
  Filled 2016-01-25: qty 1

## 2016-01-25 MED ORDER — PROPOFOL 10 MG/ML IV BOLUS
INTRAVENOUS | Status: AC
  Filled 2016-01-25: qty 20

## 2016-01-25 MED ORDER — ROCURONIUM BROMIDE 50 MG/5ML IV SOLN
INTRAVENOUS | Status: AC
  Filled 2016-01-25: qty 1

## 2016-01-25 MED ORDER — LACTATED RINGERS IV SOLN
INTRAVENOUS | Status: DC | PRN
  Administered 2016-01-25 (×2): via INTRAVENOUS

## 2016-01-25 MED ORDER — DOCUSATE SODIUM 100 MG PO CAPS
100.0000 mg | ORAL_CAPSULE | Freq: Two times a day (BID) | ORAL | Status: DC
  Administered 2016-01-26 – 2016-01-28 (×5): 100 mg via ORAL
  Filled 2016-01-25 (×4): qty 1

## 2016-01-25 MED ORDER — LIDOCAINE HCL (PF) 1 % IJ SOLN
INTRAMUSCULAR | Status: AC
  Filled 2016-01-25: qty 30

## 2016-01-25 MED ORDER — ENOXAPARIN SODIUM 40 MG/0.4ML ~~LOC~~ SOLN
40.0000 mg | SUBCUTANEOUS | Status: DC
  Administered 2016-01-26 – 2016-01-28 (×3): 40 mg via SUBCUTANEOUS
  Filled 2016-01-25 (×3): qty 0.4

## 2016-01-25 MED ORDER — ESCITALOPRAM OXALATE 10 MG PO TABS
10.0000 mg | ORAL_TABLET | Freq: Every day | ORAL | Status: DC
  Administered 2016-01-26 – 2016-01-28 (×3): 10 mg via ORAL
  Filled 2016-01-25 (×3): qty 1

## 2016-01-25 SURGICAL SUPPLY — 45 items
APPLIER CLIP ROT 10 11.4 M/L (STAPLE)
APR CLP MED LRG 11.4X10 (STAPLE)
BAG SPEC RTRVL LRG 6X4 10 (ENDOMECHANICALS) ×1
BLADE SURG ROTATE 9660 (MISCELLANEOUS) ×2 IMPLANT
CANISTER SUCTION 2500CC (MISCELLANEOUS) ×3 IMPLANT
CHLORAPREP W/TINT 26ML (MISCELLANEOUS) ×3 IMPLANT
CLIP APPLIE ROT 10 11.4 M/L (STAPLE) IMPLANT
COVER SURGICAL LIGHT HANDLE (MISCELLANEOUS) ×3 IMPLANT
CUTTER FLEX LINEAR 45M (STAPLE) ×6 IMPLANT
DRAPE WARM FLUID 44X44 (DRAPE) ×3 IMPLANT
ELECT REM PT RETURN 9FT ADLT (ELECTROSURGICAL) ×3
ELECTRODE REM PT RTRN 9FT ADLT (ELECTROSURGICAL) ×1 IMPLANT
ENDOLOOP SUT PDS II  0 18 (SUTURE)
ENDOLOOP SUT PDS II 0 18 (SUTURE) IMPLANT
GLOVE BIO SURGEON STRL SZ 6 (GLOVE) ×3 IMPLANT
GLOVE BIOGEL PI IND STRL 6.5 (GLOVE) ×1 IMPLANT
GLOVE BIOGEL PI INDICATOR 6.5 (GLOVE) ×2
GOWN STRL REUS W/ TWL LRG LVL3 (GOWN DISPOSABLE) ×2 IMPLANT
GOWN STRL REUS W/TWL 2XL LVL3 (GOWN DISPOSABLE) ×3 IMPLANT
GOWN STRL REUS W/TWL LRG LVL3 (GOWN DISPOSABLE) ×6
KIT BASIN OR (CUSTOM PROCEDURE TRAY) ×3 IMPLANT
KIT ROOM TURNOVER OR (KITS) ×3 IMPLANT
LIQUID BAND (GAUZE/BANDAGES/DRESSINGS) ×3 IMPLANT
NS IRRIG 1000ML POUR BTL (IV SOLUTION) ×3 IMPLANT
PAD ARMBOARD 7.5X6 YLW CONV (MISCELLANEOUS) ×6 IMPLANT
POUCH SPECIMEN RETRIEVAL 10MM (ENDOMECHANICALS) ×3 IMPLANT
RELOAD BLUE (STAPLE) ×3 IMPLANT
RELOAD STAPLE 45 3.5 BLU ETS (ENDOMECHANICALS) ×1 IMPLANT
RELOAD STAPLE TA45 3.5 REG BLU (ENDOMECHANICALS) ×3 IMPLANT
RELOAD STAPLER GOLD 60MM (STAPLE) ×1 IMPLANT
SCALPEL HARMONIC ACE (MISCELLANEOUS) ×3 IMPLANT
SCISSORS LAP 5X35 DISP (ENDOMECHANICALS) ×2 IMPLANT
SET IRRIG TUBING LAPAROSCOPIC (IRRIGATION / IRRIGATOR) ×3 IMPLANT
SLEEVE ENDOPATH XCEL 5M (ENDOMECHANICALS) ×3 IMPLANT
SPECIMEN JAR SMALL (MISCELLANEOUS) ×3 IMPLANT
STAPLE ECHEON FLEX 60 POW ENDO (STAPLE) ×2 IMPLANT
STAPLER RELOAD GOLD 60MM (STAPLE) ×3
SUT MNCRL AB 4-0 PS2 18 (SUTURE) ×3 IMPLANT
TOWEL OR 17X24 6PK STRL BLUE (TOWEL DISPOSABLE) ×3 IMPLANT
TOWEL OR 17X26 10 PK STRL BLUE (TOWEL DISPOSABLE) ×3 IMPLANT
TRAY FOLEY CATH 16FR SILVER (SET/KITS/TRAYS/PACK) ×3 IMPLANT
TRAY LAPAROSCOPIC MC (CUSTOM PROCEDURE TRAY) ×3 IMPLANT
TROCAR XCEL BLUNT TIP 100MML (ENDOMECHANICALS) ×3 IMPLANT
TROCAR XCEL NON-BLD 5MMX100MML (ENDOMECHANICALS) ×3 IMPLANT
TUBING INSUFFLATION (TUBING) ×3 IMPLANT

## 2016-01-25 NOTE — ED Notes (Signed)
NPO with solids at 0830hrs NPO with liquids at 0830hrs  Pt instructed to not eat or drink until further notice

## 2016-01-25 NOTE — ED Notes (Signed)
Pt currently states she is pain free. She states she feels bloated and has a soreness all over her abd. Pt abd soft, nontender to touch.

## 2016-01-25 NOTE — Transfer of Care (Signed)
Immediate Anesthesia Transfer of Care Note  Patient: Tina Bird  Procedure(s) Performed: Procedure(s): APPENDECTOMY LAPAROSCOPIC (N/A)  Patient Location: PACU  Anesthesia Type:General  Level of Consciousness: awake  Airway & Oxygen Therapy: Patient Spontanous Breathing and Patient connected to face mask oxygen  Post-op Assessment: Report given to RN and Post -op Vital signs reviewed and stable  Post vital signs: Reviewed and stable  Last Vitals:  Filed Vitals:   01/25/16 1930 01/25/16 2215  BP: 149/88   Pulse: 70   Temp:  35.8 C  Resp:      Complications: No apparent anesthesia complications

## 2016-01-25 NOTE — H&P (Signed)
Tina Bird is an 65 y.o. female.   Chief Complaint: Abdominal pain HPI: Pt is a 65 yo F who presents with 10 days of lower abdominal pain.  She was seen in the ED at that time and dx with UTI.  She was given script for antibiotics.  She got a little better, but pain came back.  She returned to [facility] today and had CT demonstrating acute appendicitis with appendicolith.  No evidence of abscess or phlegmon.  No change in bowel habits.  No fever/chills.  Currently pain free.  Past Medical History  Diagnosis Date  . LBP (low back pain)   . Vertigo   . Menopausal symptoms     Dr. Ree Edman  . Melanoma ([place])     Right leg and Right breast  . GERD (gastroesophageal reflux disease)   . Asthmatic bronchitis 09-08-12    03-07-12 bronchits x1  . Osteoarthritis 07/27/2011    left knee    Past Surgical History  Procedure Laterality Date  . Cholecystectomy      laparoscopic  . Tonsillectomy    . Dilation and curettage of uterus    . Tubal ligation    . Transanal hemorrhoidal dearterialization  09/11/2012    Procedure: TRANSANAL HEMORRHOIDAL DEARTERIALIZATION;  Surgeon: Adin Hector, MD;  Location: [facility];  Service: General;  Laterality: N/A;  [place] hemorrhoidal ligation/pexy  . Pexy  09/11/2012    Procedure: PEXY;  Surgeon: Adin Hector, MD;  Location: [facility];  Service: General;  Laterality: N/A;    Family History  Problem Relation Age of Onset  . Heart disease Brother   . Hypertension Other   . Alzheimer's disease Other   . Diabetes Mother   . Hypertension Mother    Social History:  reports that she quit smoking about 14 years ago. Her smoking use included Cigarettes. She has a 15 pack-year smoking history. She has never used smokeless tobacco. She reports that she drinks about 0.6 oz of alcohol per week. She reports that she does not use illicit drugs.  Allergies: No Known Allergies   (Not in a hospital admission)  Results for orders placed or performed during  the hospital encounter of 01/25/16 (from the past 48 hour(s))  Urinalysis, Routine w reflex microscopic (not at [facility])     Status: Abnormal   Collection Time: 01/25/16  1:25 PM  Result Value Ref Range   Color, Urine YELLOW YELLOW   APPearance CLEAR CLEAR   Specific Gravity, Urine 1.023 1.005 - 1.030   pH 6.0 5.0 - 8.0   Glucose, UA NEGATIVE NEGATIVE mg/dL   Hgb urine dipstick NEGATIVE NEGATIVE   Bilirubin Urine NEGATIVE NEGATIVE   Ketones, ur NEGATIVE NEGATIVE mg/dL   Protein, ur NEGATIVE NEGATIVE mg/dL   Nitrite NEGATIVE NEGATIVE   Leukocytes, UA SMALL (A) NEGATIVE  Urine microscopic-add on     Status: Abnormal   Collection Time: 01/25/16  1:25 PM  Result Value Ref Range   Squamous Epithelial / LPF 0-5 (A) NONE SEEN   WBC, UA 0-5 0 - 5 WBC/hpf   RBC / HPF 0-5 0 - 5 RBC/hpf   Bacteria, UA RARE (A) NONE SEEN   Urine-Other MUCOUS PRESENT   Basic metabolic panel     Status: Abnormal   Collection Time: 01/25/16  1:55 PM  Result Value Ref Range   Sodium 138 135 - 145 mmol/L   Potassium 3.9 3.5 - 5.1 mmol/L   Chloride 104 101 - 111  mmol/L   CO2 24 22 - 32 mmol/L   Glucose, Bld 98 65 - 99 mg/dL   BUN 17 6 - 20 mg/dL   Creatinine, Ser 0.72 0.44 - 1.00 mg/dL   Calcium 8.6 (L) 8.9 - 10.3 mg/dL   GFR calc non Af Amer >60 >60 mL/min   GFR calc Af Amer >60 >60 mL/min    Comment: (NOTE) The eGFR has been calculated using the CKD EPI equation. This calculation has not been validated in all clinical situations. eGFR's persistently <60 mL/min signify possible Chronic Kidney Disease.    Anion gap 10 5 - 15  CBC with Differential     Status: Abnormal   Collection Time: 01/25/16  1:55 PM  Result Value Ref Range   WBC 6.8 4.0 - 10.5 K/uL   RBC 4.03 3.87 - 5.11 MIL/uL   Hemoglobin 11.9 (L) 12.0 - 15.0 g/dL   HCT 35.6 (L) 36.0 - 46.0 %   MCV 88.3 78.0 - 100.0 fL   MCH 29.5 26.0 - 34.0 pg   MCHC 33.4 30.0 - 36.0 g/dL   RDW 12.6 11.5 - 15.5 %   Platelets 230 150 - 400 K/uL   Neutrophils  Relative % 65 %   Neutro Abs 4.5 1.7 - 7.7 K/uL   Lymphocytes Relative 24 %   Lymphs Abs 1.6 0.7 - 4.0 K/uL   Monocytes Relative 8 %   Monocytes Absolute 0.5 0.1 - 1.0 K/uL   Eosinophils Relative 3 %   Eosinophils Absolute 0.2 0.0 - 0.7 K/uL   Basophils Relative 0 %   Basophils Absolute 0.0 0.0 - 0.1 K/uL   Ct Abdomen Pelvis W Contrast  01/25/2016  CLINICAL DATA:  Abdominal pain x 1 week, dx bladder infection finished antibiotics now pain is worse, lower abdominal pain, hx diverticulitis, denies n/v/d, c/o bloating worse after eating^111m ISOVUE-300 IOPAMIDOL (ISOVUE-300) INJECTION 61% EXAM: CT ABDOMEN AND PELVIS WITH CONTRAST TECHNIQUE: Multidetector CT imaging of the abdomen and pelvis was performed using the standard protocol following bolus administration of intravenous contrast. CONTRAST:  1082mISOVUE-300 IOPAMIDOL (ISOVUE-300) INJECTION 61% COMPARISON:  None. FINDINGS: Lower chest: No pulmonary nodules, pleural effusions, or infiltrates. Heart size is normal. No imaged pericardial effusion or significant coronary artery calcifications. Upper abdomen: No focal abnormality identified within the liver, spleen, pancreas, or adrenal glands. Gallbladder is absent. There small renal cysts bilaterally.  No hydronephrosis. Gastrointestinal tract: Small hiatal hernia is present. The stomach otherwise has a normal appearance. Small bowel loops are normal in appearance. There are scattered colonic diverticula but no CT evidence for acute diverticulitis. The appendix is thickened, measuring 19 mm. Appendicolith is present. The adjacent terminal ileum is normal in appearance. There is small amount of fluid surrounding the appendix but no abscess or free air. Pelvis: The uterus is present. The left ovary has a normal appearance. The right ovary is adjacent to the inflamed appendix but otherwise normal in appearance. There is no free pelvic fluid. Small amount of stranding within the right pericolic gutter.  Urinary bladder is distended but otherwise normal in appearance. Retroperitoneum: Right lower quadrant mesenteric lymph nodes are identified, largest measuring approximately 1.9 cm. Lymph nodes are felt to be reactive no significant retroperitoneal adenopathy. There is minimal atherosclerotic calcification of the abdominal aorta. No aneurysm. Abdominal wall: Unremarkable. Osseous structures: Degenerative changes are identified primarily at L5-S1. No suspicious lytic or blastic lesions are identified. IMPRESSION: 1. Findings are consistent with acute appendicitis. Appendicolith is present. 2. Small hiatal hernia. 3.  Colonic diverticulosis without acute diverticulitis. 4. Probably reactive right lower quadrant mesenteric lymph nodes. These results will be called to the ordering clinician or representative by the Radiologist Assistant, and communication documented in the PACS or zVision Dashboard. Electronically Signed   By: Nolon Nations M.D.   On: 01/25/2016 15:42    Review of Systems  Constitutional: Negative.   HENT: Negative.   Eyes: Negative.   Respiratory: Negative.   Cardiovascular: Negative.   Gastrointestinal: Positive for abdominal pain.       Anorexia   Genitourinary: Positive for dysuria.  Musculoskeletal: Negative.   Skin: Negative.   Neurological: Negative.   Endo/Heme/Allergies: Negative.   Psychiatric/Behavioral: Negative.     Blood pressure 129/72, pulse 72, temperature 98.2 F (36.8 C), temperature source Oral, resp. rate 16, height 5' 5"  (1.651 m), weight 68.947 kg (152 lb), SpO2 98 %. Physical Exam  Constitutional: She is oriented to person, place, and time. She appears well-developed and well-nourished. No distress.  HENT:  Head: Normocephalic and atraumatic.  Right Ear: External ear normal.  Left Ear: External ear normal.  Eyes: Conjunctivae are normal. Pupils are equal, round, and reactive to light. No scleral icterus.  Neck: Normal range of motion. No tracheal  deviation present. No thyromegaly present.  Cardiovascular: Normal rate, regular rhythm and intact distal pulses.   Respiratory: Effort normal and breath sounds normal. No respiratory distress.  GI: Soft. She exhibits no distension. There is tenderness (RLQ). There is guarding (voluntary). There is no rebound.  Musculoskeletal: Normal range of motion.  Neurological: She is alert and oriented to person, place, and time. Coordination normal.  Skin: Skin is warm and dry. No rash noted. She is not diaphoretic. No erythema. No pallor.  Psychiatric: She has a normal mood and affect. Her behavior is normal. Judgment and thought content normal.     Assessment/Plan Acute appendicitis Appendectomy was described to the patient.  The incisions and surgical technique were explained.  The patient was advised that some of the hair on the abdomen would be clipped, and that a foley catheter would be placed.  I advised the patient of the risks of surgery including, but not limited to, bleeding, infection, damage to other structures, risk of an open operation, risk of abscess, and risk of blood clot.  The recovery was also described to the patient.  She was advised that he will have lifting restrictions for 2 weeks.     Stark Klein, MD 01/25/2016, 6:55 PM

## 2016-01-25 NOTE — Op Note (Signed)
Appendectomy, Lap, Procedure Note  Indications: The patient presented with a history of right-sided abdominal pain for 10 days. She was first dx with a UTI and placed on antibiotics.  She did not get better, and today a CT revealed findings consistent with acute appendicitis.  Pre-operative Diagnosis: acute appendicitis  Post-operative Diagnosis: Same  Surgeon: Stark Klein   Assistants: n/a  Anesthesia: General endotracheal anesthesia and Local anesthesia 1% plain lidocaine, 0.25.% bupivacaine, with epinephrine  ASA Class: 2, E  Procedure Details  The patient was seen again in the Holding Room. The risks, benefits, complications, treatment options, and expected outcomes were discussed with the patient and/or family. The possibilities of perforation of viscus, bleeding, recurrent infection, the need for additional procedures, failure to diagnose a condition, and creating a complication requiring transfusion or operation were discussed. There was concurrence with the proposed plan and informed consent was obtained. The site of surgery was properly noted. The patient was taken to Operating Room, identified as Tina Bird and the procedure verified as Appendectomy. A Time Out was held and the above information confirmed.  The patient was placed in the supine position and general anesthesia was induced, along with placement of orogastric tube, Venodyne boots, and a Foley catheter. The abdomen was prepped and draped in a sterile fashion. Local anesthetic was infiltrated in the infraumbilical region.  A 1.5 cm curvilinear transverse incision was made just below the umbilicus.  The Kelly clamp was used to spread the subcutaneous tissues.  The fascia was elevated with 2 Kocher clamps and incised with the #11 blade.  A Claiborne Billings was used to confirm entrance into the peritoneal cavity.  A pursestring suture was placed around the fascial incision.  The Hasson trocar was inserted into the abdomen and  held in place with the tails of the suture.  The pneumoperitoneum was then established to steady pressure of 15 mmHg.     Additional 5 mm cannulas then placed in the left lower quadrant of the abdomen and the suprapubic region under direct visualization.  A careful evaluation of the entire abdomen was carried out. The patient was placed in Trendelenburg and rotated to the left.  The small intestines were retracted in the cephalad and left lateral direction away from the pelvis and right lower quadrant. The appendix was not immediately seen.  The terminal ileum was stuck in the pelvis and this was taken down sharply.  Underneath the TI was an extremely firm appendix.  This was very socked in and adherent to the TI.  A combination of blunt and sharp dissection was used to separate the appendix from the TI.  The appendix started to pull apart due to the nature of the inflammation.  The cecum also had to be rotated medially.  The appendiceal base was very firm.    A small portion of cecum was taken with the appendix using an echelon stapler.  The appendix was removed from the abdomen with an Endocatch bag through the umbilical.  There was no evidence of bleeding, leakage, or complication after division of the appendix. Irrigation was also performed and irrigate suctioned from the abdomen as well.  The 5 mm trocars were removed.  The pneumoperitoneum was evacuated from the abdomen.    The trocar site skin wounds were closed with 4-0 Monocryl and dressed with Dermabond.  Instrument, sponge, and needle counts were correct at the conclusion of the case.   Findings: The appendix was found to be very hard and inflamed consistent with  a 10 day course of appendicitis.  There were not signs of necrosis.  There was not perforation. There was not abscess formation.  Estimated Blood Loss:  less than 50 mL         Specimens: appendix to pathology         Complications:  None; patient tolerated the procedure well.          Disposition: PACU - hemodynamically stable.         Condition: stable

## 2016-01-25 NOTE — ED Provider Notes (Signed)
Patient was seen and evaluated on arrival to the emergency department. Will contact general surgery for appendectomy. Dr Barry Dienes returned call and will see patient in consultation.   Leo Grosser, MD 01/25/16 [phone]

## 2016-01-25 NOTE — Anesthesia Preprocedure Evaluation (Addendum)
Anesthesia Evaluation  Patient identified by MRN, date of birth, ID band Patient awake    Reviewed: Allergy & Precautions, H&P , NPO status , Patient's Chart, lab work & pertinent test results  Airway Mallampati: II  TM Distance: >3 FB Neck ROM: Full    Dental no notable dental hx. (+) Teeth Intact, Dental Advisory Given   Pulmonary asthma , former smoker,    Pulmonary exam normal breath sounds clear to auscultation       Cardiovascular negative cardio ROS   Rhythm:Regular Rate:Normal     Neuro/Psych negative neurological ROS  negative psych ROS   GI/Hepatic Neg liver ROS, GERD  Medicated and Controlled,  Endo/Other  negative endocrine ROS  Renal/GU negative Renal ROS  negative genitourinary   Musculoskeletal  (+) Arthritis ,   Abdominal   Peds  Hematology negative hematology ROS (+)   Anesthesia Other Findings   Reproductive/Obstetrics negative OB ROS                            Anesthesia Physical Anesthesia Plan  ASA: II and emergent  Anesthesia Plan: General   Post-op Pain Management:    Induction: Intravenous, Rapid sequence and Cricoid pressure planned  Airway Management Planned: Oral ETT  Additional Equipment:   Intra-op Plan:   Post-operative Plan: Extubation in OR  Informed Consent: I have reviewed the patients History and Physical, chart, labs and discussed the procedure including the risks, benefits and alternatives for the proposed anesthesia with the patient or authorized representative who has indicated his/her understanding and acceptance.   Dental advisory given  Plan Discussed with: CRNA  Anesthesia Plan Comments:         Anesthesia Quick Evaluation

## 2016-01-25 NOTE — Anesthesia Postprocedure Evaluation (Signed)
Anesthesia Post Note  Patient: Tina Bird  Procedure(s) Performed: Procedure(s) (LRB): APPENDECTOMY LAPAROSCOPIC (N/A)  Patient location during evaluation: PACU Anesthesia Type: General Level of consciousness: awake and alert Pain management: pain level controlled Vital Signs Assessment: post-procedure vital signs reviewed and stable Respiratory status: spontaneous breathing, nonlabored ventilation, respiratory function stable and patient connected to nasal cannula oxygen Cardiovascular status: blood pressure returned to baseline and stable Postop Assessment: no signs of nausea or vomiting Anesthetic complications: no    Last Vitals:  Filed Vitals:   01/25/16 2300 01/25/16 2333  BP: 143/72 146/73  Pulse: 84 90  Temp: 36.4 C 36.3 C  Resp: 15 19    Last Pain:  Filed Vitals:   01/25/16 2334  PainSc: Asleep                 Avaleen Brownley,W. EDMOND

## 2016-01-25 NOTE — ED Provider Notes (Addendum)
CSN: [id number]     Arrival date & time 01/25/16  84 History   First MD Initiated Contact with Patient 01/25/16 1328     Chief Complaint  Patient presents with  . Abdominal Pain      Patient is a 65 y.o. female presenting with abdominal pain. The history is provided by the patient.  Abdominal Pain Associated symptoms: no chest pain, no diarrhea, no fever, no nausea, no shortness of breath and no vomiting   Patient presents with lower abdominal pain. She's had it for the last around 10 days. Was seen in the ER diagnosed with urinary tract infection. She had tenderness with it and there was discussion CT scan but the decision was made for antibiotic treatment and follow-up. Patient states she felt a little better for a while but the pain is returned does not worsen was before. It is dull and constant. No diarrhea or constipation. No fevers. She has had a mildly decreased appetite. No dysuria. No flank pain. No vaginal bleeding or discharge.  Past Medical History  Diagnosis Date  . LBP (low back pain)   . Vertigo   . Menopausal symptoms     Dr. Ree Edman  . Melanoma ([place])     Right leg and Right breast  . GERD (gastroesophageal reflux disease)   . Asthmatic bronchitis 09-08-12    03-07-12 bronchits x1  . Osteoarthritis 07/27/2011    left knee   Past Surgical History  Procedure Laterality Date  . Cholecystectomy      laparoscopic  . Tonsillectomy    . Dilation and curettage of uterus    . Tubal ligation    . Transanal hemorrhoidal dearterialization  09/11/2012    Procedure: TRANSANAL HEMORRHOIDAL DEARTERIALIZATION;  Surgeon: Adin Hector, MD;  Location: [facility];  Service: General;  Laterality: N/A;  [place] hemorrhoidal ligation/pexy  . Pexy  09/11/2012    Procedure: PEXY;  Surgeon: Adin Hector, MD;  Location: [facility];  Service: General;  Laterality: N/A;   Family History  Problem Relation Age of Onset  . Heart disease Brother   . Hypertension Other   . Alzheimer's disease Other    . Diabetes Mother   . Hypertension Mother    Social History  Substance Use Topics  . Smoking status: Former Smoker -- 0.50 packs/day for 30 years    Types: Cigarettes    Quit date: 10/04/2001  . Smokeless tobacco: Never Used  . Alcohol Use: 0.6 oz/week    1 Glasses of wine per week     Comment: rare wine   OB History    No data available     Review of Systems  Constitutional: Positive for appetite change. Negative for fever and activity change.  Eyes: Negative for pain.  Respiratory: Negative for chest tightness and shortness of breath.   Cardiovascular: Negative for chest pain and leg swelling.  Gastrointestinal: Positive for abdominal pain. Negative for nausea, vomiting and diarrhea.  Genitourinary: Negative for flank pain.  Musculoskeletal: Negative for back pain and neck stiffness.  Skin: Negative for rash.  Neurological: Negative for weakness, numbness and headaches.  Psychiatric/Behavioral: Negative for behavioral problems.      Allergies  Review of patient's allergies indicates no known allergies.  Home Medications   Prior to Admission medications   Medication Sig Start Date End Date Taking? Authorizing Provider  Cholecalciferol 1000 UNITS tablet Take 1,000 Units by mouth daily.      Historical Provider, MD  clotrimazole-betamethasone (LOTRISONE) cream Apply 1  application topically 2 (two) times daily. 04/22/15   Aleksei Plotnikov V, MD  escitalopram (LEXAPRO) 10 MG tablet Take 1 tablet (10 mg total) by mouth daily. 01/15/16   Carlisle Cater, PA-C  famotidine (PEPCID) 20 MG tablet Take 1 tablet (20 mg total) by mouth at bedtime. Patient not taking: Reported on 04/22/2015 10/30/12   Lew Dawes V, MD  ibuprofen (ADVIL,MOTRIN) 600 MG tablet Take 600 mg by mouth every 6 (six) hours as needed. Pain    Historical Provider, MD  meclizine (ANTIVERT) 25 MG tablet Take 12.5 mg by mouth 3 (three) times daily as needed. verti    Historical Provider, MD  omeprazole  (PRILOSEC) 40 MG capsule Take 1 capsule (40 mg total) by mouth daily. 06/05/15   Aleksei Plotnikov V, MD  vitamin B-12 (CYANOCOBALAMIN) 1000 MCG tablet Take 1,000 mcg by mouth daily.    Historical Provider, MD   BP 131/68 mmHg  Pulse 80  Temp(Src) 98.2 F (36.8 C) (Oral)  Resp 18  Ht 5\' 5"  (1.651 m)  Wt 152 lb (68.947 kg)  BMI 25.29 kg/m2  SpO2 98% Physical Exam  Constitutional: She is oriented to person, place, and time. She appears well-developed and well-nourished.  HENT:  Head: Normocephalic.  Neck: Neck supple.  Cardiovascular: Normal rate and regular rhythm.   No murmur heard. Pulmonary/Chest: Effort normal and breath sounds normal. No respiratory distress. She has no wheezes. She has no rales.  Abdominal: Soft. Bowel sounds are normal. She exhibits no distension. There is tenderness. There is no rebound and no guarding.  Suprapubic to right lower quadrant tenderness without rebound or guarding.  Musculoskeletal: Normal range of motion. She exhibits no edema.  Neurological: She is alert and oriented to person, place, and time.  Skin: Skin is warm and dry.  Psychiatric: She has a normal mood and affect. Her speech is normal.  Nursing note and vitals reviewed.   ED Course  Procedures (including critical care time) Labs Review Labs Reviewed  URINALYSIS, ROUTINE W REFLEX MICROSCOPIC (NOT AT [facility]) - Abnormal; Notable for the following:    Leukocytes, UA SMALL (*)    All other components within normal limits  BASIC METABOLIC PANEL - Abnormal; Notable for the following:    Calcium 8.6 (*)    All other components within normal limits  CBC WITH DIFFERENTIAL/PLATELET - Abnormal; Notable for the following:    Hemoglobin 11.9 (*)    HCT 35.6 (*)    All other components within normal limits  URINE MICROSCOPIC-ADD ON - Abnormal; Notable for the following:    Squamous Epithelial / LPF 0-5 (*)    Bacteria, UA RARE (*)    All other components within normal limits    Imaging  Review Ct Abdomen Pelvis W Contrast  01/25/2016  CLINICAL DATA:  Abdominal pain x 1 week, dx bladder infection finished antibiotics now pain is worse, lower abdominal pain, hx diverticulitis, denies n/v/d, c/o bloating worse after eating^125mL ISOVUE-300 IOPAMIDOL (ISOVUE-300) INJECTION 61% EXAM: CT ABDOMEN AND PELVIS WITH CONTRAST TECHNIQUE: Multidetector CT imaging of the abdomen and pelvis was performed using the standard protocol following bolus administration of intravenous contrast. CONTRAST:  140mL ISOVUE-300 IOPAMIDOL (ISOVUE-300) INJECTION 61% COMPARISON:  None. FINDINGS: Lower chest: No pulmonary nodules, pleural effusions, or infiltrates. Heart size is normal. No imaged pericardial effusion or significant coronary artery calcifications. Upper abdomen: No focal abnormality identified within the liver, spleen, pancreas, or adrenal glands. Gallbladder is absent. There small renal cysts bilaterally.  No hydronephrosis. Gastrointestinal tract: Small  hiatal hernia is present. The stomach otherwise has a normal appearance. Small bowel loops are normal in appearance. There are scattered colonic diverticula but no CT evidence for acute diverticulitis. The appendix is thickened, measuring 19 mm. Appendicolith is present. The adjacent terminal ileum is normal in appearance. There is small amount of fluid surrounding the appendix but no abscess or free air. Pelvis: The uterus is present. The left ovary has a normal appearance. The right ovary is adjacent to the inflamed appendix but otherwise normal in appearance. There is no free pelvic fluid. Small amount of stranding within the right pericolic gutter. Urinary bladder is distended but otherwise normal in appearance. Retroperitoneum: Right lower quadrant mesenteric lymph nodes are identified, largest measuring approximately 1.9 cm. Lymph nodes are felt to be reactive no significant retroperitoneal adenopathy. There is minimal atherosclerotic calcification of the  abdominal aorta. No aneurysm. Abdominal wall: Unremarkable. Osseous structures: Degenerative changes are identified primarily at L5-S1. No suspicious lytic or blastic lesions are identified. IMPRESSION: 1. Findings are consistent with acute appendicitis. Appendicolith is present. 2. Small hiatal hernia. 3. Colonic diverticulosis without acute diverticulitis. 4. Probably reactive right lower quadrant mesenteric lymph nodes. These results will be called to the ordering clinician or representative by the Radiologist Assistant, and communication documented in the PACS or zVision Dashboard. Electronically Signed   By: Nolon Nations M.D.   On: 01/25/2016 15:42   I have personally reviewed and evaluated these images and lab results as part of my medical decision-making.   EKG Interpretation None      MDM   Final diagnoses:  Acute appendicitis with localized peritonitis    Patient  With abdominal pain. Recently treated for UTI. Did not improve. Now found to have acute appendicitis. Lab work still reassuring. Will discuss with general surgery and transfer.    Davonna Belling, MD 01/25/16 1553   Accepted by Dr. Barry Dienes and also discussed with Dr. Vallery Ridge. We'll transfer to [facility] ER.  Davonna Belling, MD 01/25/16 [phone]

## 2016-01-25 NOTE — ED Notes (Signed)
Pt has had abdominal pain since last week.  Pt dx with bladder infection.  Pt reports that she has finished her antibiotic and pain has worsened.  States abdominal bloating.  Denies N/V/D.

## 2016-01-25 NOTE — ED Notes (Signed)
Presents today with abd pain, states was seen last week for the same complaints, dx with bladder infection and rec abx. States has completed abx and now pain has returned and states that pain is now worse, feels bloated

## 2016-01-25 NOTE — ED Notes (Signed)
Waiting on surgeon evaluation

## 2016-01-25 NOTE — ED Notes (Signed)
Denies any N/V or D. Appetite okay. Tolerating PO fluids and solids as normal

## 2016-01-25 NOTE — ED Notes (Signed)
Phone Hand Off Report given to [facility] Transport Team 

## 2016-01-25 NOTE — Anesthesia Procedure Notes (Signed)
Procedure Name: Intubation Date/Time: 01/25/2016 8:30 PM Performed by: Flois Mctague S Pre-anesthesia Checklist: Patient identified, Timeout performed, Emergency Drugs available, Suction available and Patient being monitored Patient Re-evaluated:Patient Re-evaluated prior to inductionOxygen Delivery Method: Circle system utilized Preoxygenation: Pre-oxygenation with 100% oxygen Intubation Type: IV induction, Rapid sequence and Cricoid Pressure applied Ventilation: Mask ventilation without difficulty Laryngoscope Size: Mac and 3 Grade View: Grade I Tube type: Oral Tube size: 7.5 mm Number of attempts: 1 Airway Equipment and Method: Stylet Placement Confirmation: positive ETCO2,  ETT inserted through vocal cords under direct vision and breath sounds checked- equal and bilateral Secured at: 23 cm Tube secured with: Tape Dental Injury: Teeth and Oropharynx as per pre-operative assessment

## 2016-01-25 NOTE — ED Notes (Signed)
MD at bedside. 

## 2016-01-26 ENCOUNTER — Encounter (HOSPITAL_COMMUNITY): Payer: Self-pay | Admitting: General Surgery

## 2016-01-26 ENCOUNTER — Inpatient Hospital Stay: Payer: Medicare Other | Admitting: Internal Medicine

## 2016-01-26 LAB — CBC
HEMATOCRIT: 32.9 % — AB (ref 36.0–46.0)
HEMOGLOBIN: 10.7 g/dL — AB (ref 12.0–15.0)
MCH: 28.5 pg (ref 26.0–34.0)
MCHC: 32.5 g/dL (ref 30.0–36.0)
MCV: 87.7 fL (ref 78.0–100.0)
PLATELETS: 188 10*3/uL (ref 150–400)
RBC: 3.75 MIL/uL — AB (ref 3.87–5.11)
RDW: 12.7 % (ref 11.5–15.5)
WBC: 8.2 10*3/uL (ref 4.0–10.5)

## 2016-01-26 LAB — BASIC METABOLIC PANEL
Anion gap: 8 (ref 5–15)
BUN: 7 mg/dL (ref 6–20)
CHLORIDE: 103 mmol/L (ref 101–111)
CO2: 27 mmol/L (ref 22–32)
Calcium: 8.1 mg/dL — ABNORMAL LOW (ref 8.9–10.3)
Creatinine, Ser: 0.86 mg/dL (ref 0.44–1.00)
GFR calc Af Amer: >60 mL/min (ref >60)
GFR calc non Af Amer: >60 mL/min (ref >60)
GLUCOSE: 187 mg/dL — AB (ref 65–99)
POTASSIUM: 4.2 mmol/L (ref 3.5–5.1)
Sodium: 138 mmol/L (ref 135–145)

## 2016-01-26 LAB — CREATININE, SERUM
Creatinine, Ser: 0.74 mg/dL (ref 0.44–1.00)
GFR calc Af Amer: >60 mL/min (ref >60)
GFR calc non Af Amer: >60 mL/min (ref >60)

## 2016-01-26 MED ORDER — SODIUM CHLORIDE 0.9 % IV SOLN
INTRAVENOUS | Status: DC
  Administered 2016-01-26: 17:00:00 via INTRAVENOUS

## 2016-01-26 MED ORDER — WHITE PETROLATUM GEL
Status: AC
  Administered 2016-01-26: 16:00:00
  Filled 2016-01-26: qty 1

## 2016-01-26 NOTE — Progress Notes (Signed)
1 Day Post-Op  Subjective: Sore but feels better.  Had episode of Vertigo this AM.  She normally does some exercise for her head and if that fails has meclizine for it at home.    Objective: Vital signs in last 24 hours: Temp:  [96.5 F (35.8 C)-98.2 F (36.8 C)] 97.4 F (36.3 C) (04/24 0459) Pulse Rate:  [70-97] 83 (04/24 0459) Resp:  [13-25] 19 (04/24 0459) BP: (129-166)/(65-95) 135/65 mmHg (04/24 0459) SpO2:  [94 %-100 %] 100 % (04/24 0459) Weight:  [68.947 kg (152 lb)] 68.947 kg (152 lb) (04/23 1317)   PO 120 1850 urine Afebrile, VSS Labs OK  Intake/Ou tput from previous day: 04/23 0701 - 04/24 0700 In: 3060 [P.O.:120; I.V.:2940] Out: 1900 [Urine:1850; Blood:50] Intake/Output this shift:    General appearance: alert, cooperative and no distress Resp: clear to auscultation bilaterally GI: soft, sore, sites look fine.    Lab Results:   Recent Labs  01/25/16 2328 01/26/16 0505  WBC 6.4 8.2  HGB 11.1* 10.7*  HCT 34.3* 32.9*  PLT 187 188    BMET  Recent Labs  01/25/16 1355 01/25/16 2328 01/26/16 0505  NA 138  --  138  K 3.9  --  4.2  CL 104  --  103  CO2 24  --  27  GLUCOSE 98  --  187*  BUN 17  --  7  CREATININE 0.72 0.74 0.86  CALCIUM 8.6*  --  8.1*   PT/INR No results for input(s): LABPROT, INR in the last 72 hours.  No results for input(s): AST, ALT, ALKPHOS, BILITOT, PROT, ALBUMIN in the last 168 hours.   Lipase     Component Value Date/Time   LIPASE 13 01/15/2016 1110     Studies/Results: Ct Abdomen Pelvis W Contrast  01/25/2016  CLINICAL DATA:  Abdominal pain x 1 week, dx bladder infection finished antibiotics now pain is worse, lower abdominal pain, hx diverticulitis, denies n/v/d, c/o bloating worse after eating^134mL ISOVUE-300 IOPAMIDOL (ISOVUE-300) INJECTION 61% EXAM: CT ABDOMEN AND PELVIS WITH CONTRAST TECHNIQUE: Multidetector CT imaging of the abdomen and pelvis was performed using the standard protocol following bolus  administration of intravenous contrast. CONTRAST:  181mL ISOVUE-300 IOPAMIDOL (ISOVUE-300) INJECTION 61% COMPARISON:  None. FINDINGS: Lower chest: No pulmonary nodules, pleural effusions, or infiltrates. Heart size is normal. No imaged pericardial effusion or significant coronary artery calcifications. Upper abdomen: No focal abnormality identified within the liver, spleen, pancreas, or adrenal glands. Gallbladder is absent. There small renal cysts bilaterally.  No hydronephrosis. Gastrointestinal tract: Small hiatal hernia is present. The stomach otherwise has a normal appearance. Small bowel loops are normal in appearance. There are scattered colonic diverticula but no CT evidence for acute diverticulitis. The appendix is thickened, measuring 19 mm. Appendicolith is present. The adjacent terminal ileum is normal in appearance. There is small amount of fluid surrounding the appendix but no abscess or free air. Pelvis: The uterus is present. The left ovary has a normal appearance. The right ovary is adjacent to the inflamed appendix but otherwise normal in appearance. There is no free pelvic fluid. Small amount of stranding within the right pericolic gutter. Urinary bladder is distended but otherwise normal in appearance. Retroperitoneum: Right lower quadrant mesenteric lymph nodes are identified, largest measuring approximately 1.9 cm. Lymph nodes are felt to be reactive no significant retroperitoneal adenopathy. There is minimal atherosclerotic calcification of the abdominal aorta. No aneurysm. Abdominal wall: Unremarkable. Osseous structures: Degenerative changes are identified primarily at L5-S1. No suspicious lytic or  blastic lesions are identified. IMPRESSION: 1. Findings are consistent with acute appendicitis. Appendicolith is present. 2. Small hiatal hernia. 3. Colonic diverticulosis without acute diverticulitis. 4. Probably reactive right lower quadrant mesenteric lymph nodes. These results will be called  to the ordering clinician or representative by the Radiologist Assistant, and communication documented in the PACS or zVision Dashboard. Electronically Signed   By: Nolon Nations M.D.   On: 01/25/2016 15:42    Medications: . clotrimazole   Topical BID  . docusate sodium  100 mg Oral BID  . enoxaparin (LOVENOX) injection  40 mg Subcutaneous Q24H  . escitalopram  10 mg Oral Daily  . HYDROmorphone      . ketorolac  15 mg Intravenous Q6H  . pantoprazole  40 mg Oral Daily  . piperacillin-tazobactam (ZOSYN)  IV  3.375 g Intravenous Q8H  . promethazine       . dextrose 5 % and 0.45 % NaCl with KCl 20 mEq/L 100 mL/hr at 01/25/16 2325   Prior to Admission medications   Medication Sig Start Date End Date Taking? Authorizing Provider  Cholecalciferol 1000 UNITS tablet Take 1,000 Units by mouth daily.     Yes Historical Provider, MD  ibuprofen (ADVIL,MOTRIN) 200 MG tablet Take 400 mg by mouth every 6 (six) hours as needed for moderate pain.   Yes Historical Provider, MD  Magnesium Hydroxide (MAGNESIA PO) Take 1 tablet by mouth daily.   Yes Historical Provider, MD  meclizine (ANTIVERT) 25 MG tablet Take 12.5 mg by mouth 3 (three) times daily as needed. verti   Yes Historical Provider, MD  omeprazole (PRILOSEC) 40 MG capsule Take 1 capsule (40 mg total) by mouth daily. 06/05/15  Yes Aleksei Plotnikov V, MD  vitamin B-12 (CYANOCOBALAMIN) 1000 MCG tablet Take 1,000 mcg by mouth daily.   Yes Historical Provider, MD  cephALEXin (KEFLEX) 500 MG capsule Take 500 mg by mouth 2 (two) times daily. Reported on 01/25/2016 01/15/16   Historical Provider, MD  clotrimazole-betamethasone (LOTRISONE) cream Apply 1 application topically 2 (two) times daily. 04/22/15   Aleksei Plotnikov V, MD  escitalopram (LEXAPRO) 10 MG tablet Take 1 tablet (10 mg total) by mouth daily. 01/15/16   Carlisle Cater, PA-C  famotidine (PEPCID) 20 MG tablet Take 1 tablet (20 mg total) by mouth at bedtime. Patient not taking: Reported on  04/22/2015 10/30/12   Cassandria Anger, MD     Assessment/Plan Acute appendicitis S/p laparoscopic appendectomy, 01/25/16, Dr Barry Dienes POD #1 Hx of Vertigo Melanoma (right leg and breast) GERD Osteoarthritis Hx of back pain. FEN:  IV fluids -going to saline lock; full liquids and advance to soft diet. ID: flagy x 1 l/Zosyn day 2 VTE:  Lovenox/SCD  . Plan:  I am going to advance her diet and see how she does with mobilization.  I will go slower with her than usual because of the operative findings.  If she does well she may go home later this AM.  She is now retired and lives with her husband.         Earnstine Regal 01/26/2016 [phone]

## 2016-01-26 NOTE — Care Management Note (Signed)
Case Management Note  Patient Details  Name: Tina Bird MRN: [id number] Date of Birth: 1950-11-08  Subjective/Objective:                    Action/Plan:   Expected Discharge Date:                  Expected Discharge Plan:  Home/Self Care  In-House Referral:     Discharge planning Services     Post Acute Care Choice:    Choice offered to:     DME Arranged:    DME Agency:     HH Arranged:    [place] Agency:     Status of Service:  In process, will continue to follow  Medicare Important Message Given:    Date Medicare IM Given:    Medicare IM give by:    Date Additional Medicare IM Given:    Additional Medicare Important Message give by:     If discussed at [place] of Stay Meetings, dates discussed:    Additional Comments:  Marilu Favre, RN 01/26/2016, 10:26 AM

## 2016-01-26 NOTE — Progress Notes (Signed)
She had some nausea with late breakfast and unable to eat a lunch.  She thinks her vertigo is coming back after the pain pill.  I will put her back on some IV fluids, and recheck labs AM.  No real history with pain meds.  I will give her some Ibuprofen and plain tylenol for PRN's.    Discussed all of this with her.

## 2016-01-27 LAB — BASIC METABOLIC PANEL
Anion gap: 8 (ref 5–15)
BUN: 11 mg/dL (ref 6–20)
CALCIUM: 7.9 mg/dL — AB (ref 8.9–10.3)
CHLORIDE: 102 mmol/L (ref 101–111)
CO2: 27 mmol/L (ref 22–32)
CREATININE: 0.92 mg/dL (ref 0.44–1.00)
GFR calc non Af Amer: >60 mL/min (ref >60)
GLUCOSE: 118 mg/dL — AB (ref 65–99)
Potassium: 3.7 mmol/L (ref 3.5–5.1)
Sodium: 137 mmol/L (ref 135–145)

## 2016-01-27 LAB — CBC
HEMATOCRIT: 27.7 % — AB (ref 36.0–46.0)
HEMOGLOBIN: 8.9 g/dL — AB (ref 12.0–15.0)
MCH: 28.2 pg (ref 26.0–34.0)
MCHC: 32.1 g/dL (ref 30.0–36.0)
MCV: 87.7 fL (ref 78.0–100.0)
Platelets: 184 10*3/uL (ref 150–400)
RBC: 3.16 MIL/uL — ABNORMAL LOW (ref 3.87–5.11)
RDW: 12.8 % (ref 11.5–15.5)
WBC: 5.1 10*3/uL (ref 4.0–10.5)

## 2016-01-27 MED ORDER — SACCHAROMYCES BOULARDII 250 MG PO CAPS
250.0000 mg | ORAL_CAPSULE | Freq: Two times a day (BID) | ORAL | Status: DC
  Administered 2016-01-27 – 2016-01-28 (×2): 250 mg via ORAL
  Filled 2016-01-27 (×2): qty 1

## 2016-01-27 NOTE — Discharge Instructions (Signed)
Laparoscopic Appendectomy, Adult, Care After °Refer to this sheet in the next few weeks. These instructions provide you with information on caring for yourself after your procedure. Your caregiver may also give you more specific instructions. Your treatment has been planned according to current medical practices, but problems sometimes occur. Call your caregiver if you have any problems or questions after your procedure. °HOME CARE INSTRUCTIONS °· Do not drive while taking narcotic pain medicines. °· Use stool softener if you become constipated from your pain medicines. °· Change your bandages (dressings) as directed. °· Keep your wounds clean and dry. You may wash the wounds gently with soap and water. Gently pat the wounds dry with a clean towel. °· Do not take baths, swim, or use hot tubs for 10 days, or as instructed by your caregiver. °· Only take over-the-counter or prescription medicines for pain, discomfort, or fever as directed by your caregiver. °· You may continue your normal diet as directed. °· Do not lift more than 10 pounds (4.5 kg) or play contact sports for 3 weeks, or as directed. °· Slowly increase your activity after surgery. °· Take deep breaths to avoid getting a lung infection (pneumonia). °SEEK MEDICAL CARE IF: °· You have redness, swelling, or increasing pain in your wounds. °· You have pus coming from your wounds. °· You have drainage from a wound that lasts longer than 1 day. °· You notice a bad smell coming from the wounds or dressing. °· Your wound edges break open after stitches (sutures) have been removed. °· You notice increasing pain in the shoulders (shoulder strap areas) or near your shoulder blades. °· You develop dizzy episodes or fainting while standing. °· You develop shortness of breath. °· You develop persistent nausea or vomiting. °· You cannot control your bowel functions or lose your appetite. °· You develop diarrhea. °SEEK IMMEDIATE MEDICAL CARE IF:  °· You have a  fever. °· You develop a rash. °· You have difficulty breathing or sharp pains in your chest. °· You develop any reaction or side effects to medicines given. °MAKE SURE YOU: °· Understand these instructions. °· Will watch your condition. °· Will get help right away if you are not doing well or get worse. °  °This information is not intended to replace advice given to you by your health care provider. Make sure you discuss any questions you have with your health care provider. °  °Document Released: 09/20/2005 Document Revised: 02/04/2015 Document Reviewed: 03/10/2015 °Elsevier Interactive Patient Education ©2016 [employer] ° °CCS ______[facility] °LAPAROSCOPIC SURGERY: POST OP INSTRUCTIONS °Always review your discharge instruction sheet given to you by the facility where your surgery was performed. °IF YOU HAVE DISABILITY OR FAMILY LEAVE FORMS, YOU MUST BRING THEM TO THE OFFICE FOR PROCESSING.   °DO NOT GIVE THEM TO YOUR DOCTOR. ° °1. A prescription for pain medication may be given to you upon discharge.  Take your pain medication as prescribed, if needed.  If narcotic pain medicine is not needed, then you may take acetaminophen (Tylenol) or ibuprofen (Advil) as needed. °2. Take your usually prescribed medications unless otherwise directed. °3. If you need a refill on your pain medication, please contact your pharmacy.  They will contact our office to request authorization. Prescriptions will not be filled after 5pm or on week-ends. °4. You should follow a light diet the first few days after arrival home, such as soup and crackers, etc.  Be sure to include lots of fluids daily. °5. Most   patients will experience some swelling and bruising in the area of the incisions.  Ice packs will help.  Swelling and bruising can take several days to resolve.  °6. It is common to experience some constipation if taking pain medication after surgery.  Increasing fluid intake and taking a stool softener (such as  Colace) will usually help or prevent this problem from occurring.  A mild laxative (Milk of Magnesia or Miralax) should be taken according to package instructions if there are no bowel movements after 48 hours. °7. Unless discharge instructions indicate otherwise, you may remove your bandages 24-48 hours after surgery, and you may shower at that time.  You may have steri-strips (small skin tapes) in place directly over the incision.  These strips should be left on the skin for 7-10 days.  If your surgeon used skin glue on the incision, you may shower in 24 hours.  The glue will flake off over the next 2-3 weeks.  Any sutures or staples will be removed at the office during your follow-up visit. °8. ACTIVITIES:  You may resume regular (light) daily activities beginning the next day--such as daily self-care, walking, climbing stairs--gradually increasing activities as tolerated.  You may have sexual intercourse when it is comfortable.  Refrain from any heavy lifting or straining until approved by your doctor. °a. You may drive when you are no longer taking prescription pain medication, you can comfortably wear a seatbelt, and you can safely maneuver your car and apply brakes. °b. RETURN TO WORK:  __________________________________________________________ °9. You should see your doctor in the office for a follow-up appointment approximately 2-3 weeks after your surgery.  Make sure that you call for this appointment within a day or two after you arrive home to insure a convenient appointment time. °10. OTHER INSTRUCTIONS: __________________________________________________________________________________________________________________________ __________________________________________________________________________________________________________________________ °WHEN TO CALL YOUR DOCTOR: °1. Fever over 101.0 °2. Inability to urinate °3. Continued bleeding from incision. °4. Increased pain, redness, or drainage from the  incision. °5. Increasing abdominal pain ° °The clinic staff is available to answer your questions during regular business hours.  Please don’t hesitate to call and ask to speak to one of the nurses for clinical concerns.  If you have a medical emergency, go to the nearest emergency room or call 911.  A surgeon from [facility] is always on call at the hospital. °[address], [address], [place], [place]  [address] ? [address] [address], [place], [place]   [address] °[phone] ? [phone] ? FAX [phone] °Web site: [email or web address] ° °

## 2016-01-27 NOTE — Progress Notes (Signed)
2 Days Post-Op  Subjective: She feels sleep and a bit nauseated this AM.  Her sites look fine.  Not eating much this AM, reports eating better yesterday at supper.    Objective: Vital signs in last 24 hours: Temp:  [97.5 F (36.4 C)-99 F (37.2 C)] 99 F (37.2 C) (04/25 0629) Pulse Rate:  [79-82] 81 (04/25 0629) Resp:  [18] 18 (04/25 0629) BP: (114-141)/(52-69) 115/52 mmHg (04/25 0629) SpO2:  [97 %-100 %] 97 % (04/25 0629) Last BM Date: 01/23/16 Nothing PO recorded 1000 urine Afebrile, VSS Labs OK, H/H is down since admission Path still pending Intake/Output from previous day: 04/24 0701 - 04/25 0700 In: 650 [I.V.:650] Out: 1000 [Urine:1000] Intake/Output this shift: Total I/O In: -  Out: 450 [Urine:450]  General appearance: alert, cooperative and no distress Resp: clear to auscultation bilaterally GI: soft sore, some nausea, this AM.  sites look fine.  Lab Results:   Recent Labs  01/26/16 0505 01/27/16 0546  WBC 8.2 5.1  HGB 10.7* 8.9*  HCT 32.9* 27.7*  PLT 188 184    BMET  Recent Labs  01/26/16 0505 01/27/16 0546  NA 138 137  K 4.2 3.7  CL 103 102  CO2 27 27  GLUCOSE 187* 118*  BUN 7 11  CREATININE 0.86 0.92  CALCIUM 8.1* 7.9*   PT/INR No results for input(s): LABPROT, INR in the last 72 hours.  No results for input(s): AST, ALT, ALKPHOS, BILITOT, PROT, ALBUMIN in the last 168 hours.   Lipase     Component Value Date/Time   LIPASE 13 01/15/2016 1110     Studies/Results: Ct Abdomen Pelvis W Contrast  01/25/2016  CLINICAL DATA:  Abdominal pain x 1 week, dx bladder infection finished antibiotics now pain is worse, lower abdominal pain, hx diverticulitis, denies n/v/d, c/o bloating worse after eating^153mL ISOVUE-300 IOPAMIDOL (ISOVUE-300) INJECTION 61% EXAM: CT ABDOMEN AND PELVIS WITH CONTRAST TECHNIQUE: Multidetector CT imaging of the abdomen and pelvis was performed using the standard protocol following bolus administration of intravenous  contrast. CONTRAST:  111mL ISOVUE-300 IOPAMIDOL (ISOVUE-300) INJECTION 61% COMPARISON:  None. FINDINGS: Lower chest: No pulmonary nodules, pleural effusions, or infiltrates. Heart size is normal. No imaged pericardial effusion or significant coronary artery calcifications. Upper abdomen: No focal abnormality identified within the liver, spleen, pancreas, or adrenal glands. Gallbladder is absent. There small renal cysts bilaterally.  No hydronephrosis. Gastrointestinal tract: Small hiatal hernia is present. The stomach otherwise has a normal appearance. Small bowel loops are normal in appearance. There are scattered colonic diverticula but no CT evidence for acute diverticulitis. The appendix is thickened, measuring 19 mm. Appendicolith is present. The adjacent terminal ileum is normal in appearance. There is small amount of fluid surrounding the appendix but no abscess or free air. Pelvis: The uterus is present. The left ovary has a normal appearance. The right ovary is adjacent to the inflamed appendix but otherwise normal in appearance. There is no free pelvic fluid. Small amount of stranding within the right pericolic gutter. Urinary bladder is distended but otherwise normal in appearance. Retroperitoneum: Right lower quadrant mesenteric lymph nodes are identified, largest measuring approximately 1.9 cm. Lymph nodes are felt to be reactive no significant retroperitoneal adenopathy. There is minimal atherosclerotic calcification of the abdominal aorta. No aneurysm. Abdominal wall: Unremarkable. Osseous structures: Degenerative changes are identified primarily at L5-S1. No suspicious lytic or blastic lesions are identified. IMPRESSION: 1. Findings are consistent with acute appendicitis. Appendicolith is present. 2. Small hiatal hernia. 3. Colonic diverticulosis without acute  diverticulitis. 4. Probably reactive right lower quadrant mesenteric lymph nodes. These results will be called to the ordering clinician or  representative by the Radiologist Assistant, and communication documented in the PACS or zVision Dashboard. Electronically Signed   By: Nolon Nations M.D.   On: 01/25/2016 15:42    Medications: . clotrimazole   Topical BID  . docusate sodium  100 mg Oral BID  . enoxaparin (LOVENOX) injection  40 mg Subcutaneous Q24H  . escitalopram  10 mg Oral Daily  . ketorolac  15 mg Intravenous Q6H  . pantoprazole  40 mg Oral Daily  . piperacillin-tazobactam (ZOSYN)  IV  3.375 g Intravenous Q8H   . sodium chloride 50 mL/hr at 01/26/16 1727   Assessment/Plan Acute appendicitis S/p laparoscopic appendectomy, 01/25/16, Dr Barry Dienes POD #2 Hx of Vertigo Melanoma (right leg and breast) GERD Osteoarthritis Hx of back pain. FEN: IV fluids ; soft diet. ID: flagy x 1 l/Zosyn day 3 VTE: Lovenox/SCD   Plan:  Saline lock her again and try to mobilize her more and see how she does.  Continue antibiotics for now.  I will check on home antibiotics.        Earnstine Regal 01/27/2016 [phone]

## 2016-01-28 ENCOUNTER — Telehealth: Payer: Self-pay | Admitting: *Deleted

## 2016-01-28 LAB — CBC
HEMATOCRIT: 28.8 % — AB (ref 36.0–46.0)
Hemoglobin: 9.3 g/dL — ABNORMAL LOW (ref 12.0–15.0)
MCH: 28.4 pg (ref 26.0–34.0)
MCHC: 32.3 g/dL (ref 30.0–36.0)
MCV: 87.8 fL (ref 78.0–100.0)
Platelets: 208 10*3/uL (ref 150–400)
RBC: 3.28 MIL/uL — AB (ref 3.87–5.11)
RDW: 12.5 % (ref 11.5–15.5)
WBC: 4.8 10*3/uL (ref 4.0–10.5)

## 2016-01-28 MED ORDER — ACETAMINOPHEN 325 MG PO TABS: 650.0000 mg | ORAL_TABLET | Freq: Four times a day (QID) | ORAL | Status: DC | PRN

## 2016-01-28 MED ORDER — AMOXICILLIN-POT CLAVULANATE 875-125 MG PO TABS: 1.0000 | ORAL_TABLET | Freq: Two times a day (BID) | ORAL | Status: DC

## 2016-01-28 MED ORDER — SACCHAROMYCES BOULARDII 250 MG PO CAPS: ORAL_CAPSULE | ORAL | Status: DC

## 2016-01-28 MED ORDER — OXYCODONE HCL 5 MG PO TABS: 5.0000 mg | ORAL_TABLET | ORAL | Status: DC | PRN

## 2016-01-28 MED ORDER — AMOXICILLIN-POT CLAVULANATE 875-125 MG PO TABS
1.0000 | ORAL_TABLET | Freq: Two times a day (BID) | ORAL | Status: DC
  Administered 2016-01-28: 1 via ORAL
  Filled 2016-01-28: qty 1

## 2016-01-28 NOTE — Progress Notes (Signed)
3 Days Post-Op  Subjective: Feels much better, did well with supper and breakfast.    Objective: Vital signs in last 24 hours: Temp:  [97.6 F (36.4 C)-99.1 F (37.3 C)] 97.6 F (36.4 C) (04/26 0606) Pulse Rate:  [68-80] 75 (04/26 0606) Resp:  [14-16] 16 (04/26 0606) BP: (125-135)/(56-70) 129/70 mmHg (04/26 0606) SpO2:  [93 %-95 %] 95 % (04/26 0606) Last BM Date: 01/27/16 60 PO recorded No Bm recorded 1250 urine Afebrile, VSS CBC pending Intake/Output from previous day: 04/25 0701 - 04/26 0700 In: 60 [P.O.:60] Out: 1250 [Urine:1250] Intake/Output this shift:    General appearance: alert, cooperative and no distress Resp: clear to auscultation bilaterally GI: soft sore, sites look fine.  Lab Results:   Recent Labs  01/26/16 0505 01/27/16 0546  WBC 8.2 5.1  HGB 10.7* 8.9*  HCT 32.9* 27.7*  PLT 188 184    BMET  Recent Labs  01/26/16 0505 01/27/16 0546  NA 138 137  K 4.2 3.7  CL 103 102  CO2 27 27  GLUCOSE 187* 118*  BUN 7 11  CREATININE 0.86 0.92  CALCIUM 8.1* 7.9*   PT/INR No results for input(s): LABPROT, INR in the last 72 hours.  No results for input(s): AST, ALT, ALKPHOS, BILITOT, PROT, ALBUMIN in the last 168 hours.   Lipase     Component Value Date/Time   LIPASE 13 01/15/2016 1110     Studies/Results: No results found.  Medications: . clotrimazole   Topical BID  . docusate sodium  100 mg Oral BID  . enoxaparin (LOVENOX) injection  40 mg Subcutaneous Q24H  . escitalopram  10 mg Oral Daily  . ketorolac  15 mg Intravenous Q6H  . pantoprazole  40 mg Oral Daily  . piperacillin-tazobactam (ZOSYN)  IV  3.375 g Intravenous Q8H  . saccharomyces boulardii  250 mg Oral BID  Appendix, Other than Incidental - ACUTE SUPPURATIVE APPENDICITIS. - ONE BENIGN REACTIVE LYMPH NODE (0/1).   Assessment/Plan Acute appendicitis S/p laparoscopic appendectomy, 01/25/16, Dr Barry Dienes POD #2 Hx of Vertigo Melanoma (right leg and  breast) GERD Osteoarthritis Hx of back pain. FEN: IV fluids ; soft diet. ID: flagy x 1 l/Zosyn day 4 VTE: Lovenox/SCD  She feels much better and is ready to go home.  I have given her her path report also.           Earnstine Regal 01/28/2016 [phone]

## 2016-01-28 NOTE — Progress Notes (Signed)
Discharge instructions reviewed with pt and pt's husband and prescriptions given.  Pt and pt's husband verbalized understanding and had no questions.  Pt discharged in stable condition via wheelchair with husband.  Candis Kabel Lindsay   

## 2016-01-28 NOTE — Telephone Encounter (Signed)
Pt was on TCM list admitted for acute appendicitis had a appendectomy. D/C 01/28/16 will f/u w/specialist @ [facility] in 2 wks...Tina Bird

## 2016-01-29 ENCOUNTER — Other Ambulatory Visit: Payer: Self-pay | Admitting: *Deleted

## 2016-01-29 MED ORDER — ESCITALOPRAM OXALATE 10 MG PO TABS: 10.0000 mg | ORAL_TABLET | Freq: Every day | ORAL | Status: DC

## 2016-02-06 ENCOUNTER — Encounter: Payer: Medicare Other | Admitting: Internal Medicine

## 2016-03-24 ENCOUNTER — Other Ambulatory Visit: Payer: Self-pay | Admitting: *Deleted

## 2016-03-24 MED ORDER — ESCITALOPRAM OXALATE 10 MG PO TABS: 10.0000 mg | ORAL_TABLET | Freq: Every day | ORAL | Status: DC

## 2016-03-24 NOTE — Telephone Encounter (Signed)
Left msg on triage stating she is needing refill on her Lexapro. Inform pt she is due for f/u appt will send 30 day until she see md.../lmb

## 2016-03-25 ENCOUNTER — Other Ambulatory Visit: Payer: Self-pay | Admitting: *Deleted

## 2016-04-19 ENCOUNTER — Telehealth: Payer: Self-pay | Admitting: Emergency Medicine

## 2016-04-19 MED ORDER — ESCITALOPRAM OXALATE 10 MG PO TABS: 10.0000 mg | ORAL_TABLET | Freq: Every day | ORAL | Status: DC

## 2016-04-19 NOTE — Telephone Encounter (Signed)
Last OV was 1 year ago. She has cancelled last 3 OVs with PCP.  Please call pt and schedule OV with PCP. We can not keep giving refills without seeing her. I sent one more 30 day supply. No more refills will be given until she sees PCP.

## 2016-04-19 NOTE — Telephone Encounter (Signed)
Pt called and needs a refill on escitalopram (LEXAPRO) 10 MG tablet. Pharmacy is [facility] on [address] Please follow up thanks.

## 2016-05-07 ENCOUNTER — Ambulatory Visit (INDEPENDENT_AMBULATORY_CARE_PROVIDER_SITE_OTHER): Payer: Medicare Other | Admitting: Internal Medicine

## 2016-05-07 ENCOUNTER — Encounter: Payer: Self-pay | Admitting: Internal Medicine

## 2016-05-07 VITALS — BP 139/80 | HR 81 | Ht 65.0 in | Wt 152.0 lb

## 2016-05-07 DIAGNOSIS — Z0000 Encounter for general adult medical examination without abnormal findings: Secondary | ICD-10-CM

## 2016-05-07 DIAGNOSIS — M545 Low back pain, unspecified: Secondary | ICD-10-CM

## 2016-05-07 DIAGNOSIS — Z9189 Other specified personal risk factors, not elsewhere classified: Secondary | ICD-10-CM

## 2016-05-07 DIAGNOSIS — G8929 Other chronic pain: Secondary | ICD-10-CM

## 2016-05-07 DIAGNOSIS — Z23 Encounter for immunization: Secondary | ICD-10-CM

## 2016-05-07 DIAGNOSIS — E785 Hyperlipidemia, unspecified: Secondary | ICD-10-CM

## 2016-05-07 MED ORDER — ESCITALOPRAM OXALATE 10 MG PO TABS: 10.0000 mg | ORAL_TABLET | Freq: Every day | ORAL | 5 refills | Status: DC

## 2016-05-07 MED ORDER — FAMOTIDINE 20 MG PO TABS: 20.0000 mg | ORAL_TABLET | Freq: Every day | ORAL | 3 refills | Status: DC

## 2016-05-07 NOTE — Progress Notes (Signed)
Subjective:  Patient ID: Tina Bird, female    DOB: [birth year]  Age: 65 y.o. MRN: [id number]  CC: No chief complaint on file.   HPI Xuxa Ciarlo Shore-Wright presents for a well exam She had an appendectomy in April  Outpatient Medications Prior to Visit  Medication Sig Dispense Refill  . acetaminophen (TYLENOL) 325 MG tablet Take 2 tablets (650 mg total) by mouth every 6 (six) hours as needed for mild pain (or temp > 100).    . Cholecalciferol 1000 UNITS tablet Take 1,000 Units by mouth daily.      [name] escitalopram (LEXAPRO) 10 MG tablet Take 1 tablet (10 mg total) by mouth daily. Must make f/u appt fot future refills 30 tablet 0  . famotidine (PEPCID) 20 MG tablet Take 1 tablet (20 mg total) by mouth at bedtime. 90 tablet 3  . ibuprofen (ADVIL,MOTRIN) 200 MG tablet Take 400 mg by mouth every 6 (six) hours as needed for moderate pain.    . Magnesium Hydroxide (MAGNESIA PO) Take 1 tablet by mouth daily.    . meclizine (ANTIVERT) 25 MG tablet Take 12.5 mg by mouth 3 (three) times daily as needed. verti    . omeprazole (PRILOSEC) 40 MG capsule Take 1 capsule (40 mg total) by mouth daily. 90 capsule 3  . saccharomyces boulardii (FLORASTOR) 250 MG capsule You can buy this over the counter at any drug store, I would take for at least a week after your last antibiotic.    . vitamin B-12 (CYANOCOBALAMIN) 1000 MCG tablet Take 1,000 mcg by mouth daily.    [name] amoxicillin-clavulanate (AUGMENTIN) 875-125 MG tablet Take 1 tablet by mouth every 12 (twelve) hours. 14 tablet 0  . clotrimazole-betamethasone (LOTRISONE) cream Apply 1 application topically 2 (two) times daily. (Patient not taking: Reported on 05/07/2016) 15 g 1  . oxyCODONE (OXY IR/ROXICODONE) 5 MG immediate release tablet Take 1-2 tablets (5-10 mg total) by mouth every 4 (four) hours as needed for moderate pain. (Patient not taking: Reported on 05/07/2016) 30 tablet 0   No facility-administered medications prior to visit.     ROS Review  of Systems  Constitutional: Negative for activity change, appetite change, chills, fatigue and unexpected weight change.  HENT: Negative for congestion, mouth sores and sinus pressure.   Eyes: Negative for visual disturbance.  Respiratory: Negative for cough and chest tightness.   Gastrointestinal: Negative for abdominal pain and nausea.  Genitourinary: Negative for difficulty urinating, frequency and vaginal pain.  Musculoskeletal: Negative for back pain and gait problem.  Skin: Negative for pallor and rash.  Neurological: Negative for dizziness, tremors, weakness, numbness and headaches.  Psychiatric/Behavioral: Negative for confusion, sleep disturbance and suicidal ideas.    Objective:  BP (!) 150/74   Pulse 81   Ht 5\' 5"  (1.651 m)   Wt 152 lb (68.9 kg)   SpO2 95%   BMI 25.29 kg/m   BP Readings from Last 3 Encounters:  05/07/16 (!) 150/74  01/28/16 129/70  01/15/16 157/83    Wt Readings from Last 3 Encounters:  05/07/16 152 lb (68.9 kg)  01/25/16 152 lb (68.9 kg)  01/15/16 155 lb (70.3 kg)    Physical Exam  Constitutional: She appears well-developed. No distress.  HENT:  Head: Normocephalic.  Right Ear: External ear normal.  Left Ear: External ear normal.  Nose: Nose normal.  Mouth/Throat: Oropharynx is clear and moist.  Eyes: Conjunctivae are normal. Pupils are equal, round, and reactive to light. Right eye exhibits no discharge. Left  eye exhibits no discharge.  Neck: Normal range of motion. Neck supple. No JVD present. No tracheal deviation present. No thyromegaly present.  Cardiovascular: Normal rate, regular rhythm and normal heart sounds.   Pulmonary/Chest: No stridor. No respiratory distress. She has no wheezes.  Abdominal: Soft. Bowel sounds are normal. She exhibits no distension and no mass. There is no tenderness. There is no rebound and no guarding.  Musculoskeletal: She exhibits no edema or tenderness.  Lymphadenopathy:    She has no cervical adenopathy.   Neurological: She displays normal reflexes. No cranial nerve deficit. She exhibits normal muscle tone. Coordination normal.  Skin: No rash noted. No erythema.  Psychiatric: She has a normal mood and affect. Her behavior is normal. Judgment and thought content normal.    Lab Results  Component Value Date   WBC 4.8 01/28/2016   HGB 9.3 (L) 01/28/2016   HCT 28.8 (L) 01/28/2016   PLT 208 01/28/2016   GLUCOSE 118 (H) 01/27/2016   CHOL 207 (H) 11/13/2012   TRIG 117.0 11/13/2012   HDL 40.00 11/13/2012   LDLDIRECT 138.5 11/13/2012   LDLCALC 138 (H) 06/10/2010   ALT 20 01/15/2016   AST 21 01/15/2016   NA 137 01/27/2016   K 3.7 01/27/2016   CL 102 01/27/2016   CREATININE 0.92 01/27/2016   BUN 11 01/27/2016   CO2 27 01/27/2016   TSH 1.59 11/13/2012    Ct Abdomen Pelvis W Contrast  Result Date: 01/25/2016 CLINICAL DATA:  Abdominal pain x 1 week, dx bladder infection finished antibiotics now pain is worse, lower abdominal pain, hx diverticulitis, denies n/v/d, c/o bloating worse after eating^142mL ISOVUE-300 IOPAMIDOL (ISOVUE-300) INJECTION 61% EXAM: CT ABDOMEN AND PELVIS WITH CONTRAST TECHNIQUE: Multidetector CT imaging of the abdomen and pelvis was performed using the standard protocol following bolus administration of intravenous contrast. CONTRAST:  189mL ISOVUE-300 IOPAMIDOL (ISOVUE-300) INJECTION 61% COMPARISON:  None. FINDINGS: Lower chest: No pulmonary nodules, pleural effusions, or infiltrates. Heart size is normal. No imaged pericardial effusion or significant coronary artery calcifications. Upper abdomen: No focal abnormality identified within the liver, spleen, pancreas, or adrenal glands. Gallbladder is absent. There small renal cysts bilaterally.  No hydronephrosis. Gastrointestinal tract: Small hiatal hernia is present. The stomach otherwise has a normal appearance. Small bowel loops are normal in appearance. There are scattered colonic diverticula but no CT evidence for acute  diverticulitis. The appendix is thickened, measuring 19 mm. Appendicolith is present. The adjacent terminal ileum is normal in appearance. There is small amount of fluid surrounding the appendix but no abscess or free air. Pelvis: The uterus is present. The left ovary has a normal appearance. The right ovary is adjacent to the inflamed appendix but otherwise normal in appearance. There is no free pelvic fluid. Small amount of stranding within the right pericolic gutter. Urinary bladder is distended but otherwise normal in appearance. Retroperitoneum: Right lower quadrant mesenteric lymph nodes are identified, largest measuring approximately 1.9 cm. Lymph nodes are felt to be reactive no significant retroperitoneal adenopathy. There is minimal atherosclerotic calcification of the abdominal aorta. No aneurysm. Abdominal wall: Unremarkable. Osseous structures: Degenerative changes are identified primarily at L5-S1. No suspicious lytic or blastic lesions are identified. IMPRESSION: 1. Findings are consistent with acute appendicitis. Appendicolith is present. 2. Small hiatal hernia. 3. Colonic diverticulosis without acute diverticulitis. 4. Probably reactive right lower quadrant mesenteric lymph nodes. These results will be called to the ordering clinician or representative by the Radiologist Assistant, and communication documented in the PACS or zVision Dashboard. Electronically  Signed   By: Nolon Nations M.D.   On: 01/25/2016 15:42    Assessment & Plan:   There are no diagnoses linked to this encounter. I have discontinued [name]'s amoxicillin-clavulanate. I am also having her maintain her meclizine, Cholecalciferol, vitamin B-12, famotidine, clotrimazole-betamethasone, omeprazole, ibuprofen, Magnesium Hydroxide (MAGNESIA PO), acetaminophen, oxyCODONE, saccharomyces boulardii, and escitalopram.  No orders of the defined types were placed in this encounter.    Follow-up: No Follow-up on  file.  Walker Kehr, MD

## 2016-05-07 NOTE — Assessment & Plan Note (Signed)
Chronic LBP Ibuprofen prn Vit D

## 2016-05-07 NOTE — Patient Instructions (Signed)
Preventive Care for Adults, Female A healthy lifestyle and preventive care can promote health and wellness. Preventive health guidelines for women include the following key practices.  A routine yearly physical is a good way to check with your health care provider about your health and preventive screening. It is a chance to share any concerns and updates on your health and to receive a thorough exam.  Visit your dentist for a routine exam and preventive care every 6 months. Brush your teeth twice a day and floss once a day. Good oral hygiene prevents tooth decay and gum disease.  The frequency of eye exams is based on your age, health, family medical history, use of contact lenses, and other factors. Follow your health care provider's recommendations for frequency of eye exams.  Eat a healthy diet. Foods like vegetables, fruits, whole grains, low-fat dairy products, and lean protein foods contain the nutrients you need without too many calories. Decrease your intake of foods high in solid fats, added sugars, and salt. Eat the right amount of calories for you.Get information about a proper diet from your health care provider, if necessary.  Regular physical exercise is one of the most important things you can do for your health. Most adults should get at least 150 minutes of moderate-intensity exercise (any activity that increases your heart rate and causes you to sweat) each week. In addition, most adults need muscle-strengthening exercises on 2 or more days a week.  Maintain a healthy weight. The body mass index (BMI) is a screening tool to identify possible weight problems. It provides an estimate of body fat based on height and weight. Your health care provider can find your BMI and can help you achieve or maintain a healthy weight.For adults 20 years and older:  A BMI below 18.5 is considered underweight.  A BMI of 18.5 to 24.9 is normal.  A BMI of 25 to 29.9 is considered overweight.  A  BMI of 30 and above is considered obese.  Maintain normal blood lipids and cholesterol levels by exercising and minimizing your intake of saturated fat. Eat a balanced diet with plenty of fruit and vegetables. Blood tests for lipids and cholesterol should begin at age 45 and be repeated every 5 years. If your lipid or cholesterol levels are high, you are over 50, or you are at high risk for heart disease, you may need your cholesterol levels checked more frequently.Ongoing high lipid and cholesterol levels should be treated with medicines if diet and exercise are not working.  If you smoke, find out from your health care provider how to quit. If you do not use tobacco, do not start.  Lung cancer screening is recommended for adults aged 45-80 years who are at high risk for developing lung cancer because of a history of smoking. A yearly low-dose CT scan of the lungs is recommended for people who have at least a 30-pack-year history of smoking and are a current smoker or have quit within the past 15 years. A pack year of smoking is smoking an average of 1 pack of cigarettes a day for 1 year (for example: 1 pack a day for 30 years or 2 packs a day for 15 years). Yearly screening should continue until the smoker has stopped smoking for at least 15 years. Yearly screening should be stopped for people who develop a health problem that would prevent them from having lung cancer treatment.  If you are pregnant, do not drink alcohol. If you are  breastfeeding, be very cautious about drinking alcohol. If you are not pregnant and choose to drink alcohol, do not have more than 1 drink per day. One drink is considered to be 12 ounces (355 mL) of beer, 5 ounces (148 mL) of wine, or 1.5 ounces (44 mL) of liquor.  Avoid use of street drugs. Do not share needles with anyone. Ask for help if you need support or instructions about stopping the use of drugs.  High blood pressure causes heart disease and increases the risk  of stroke. Your blood pressure should be checked at least every 1 to 2 years. Ongoing high blood pressure should be treated with medicines if weight loss and exercise do not work.  If you are 55-79 years old, ask your health care provider if you should take aspirin to prevent strokes.  Diabetes screening is done by taking a blood sample to check your blood glucose level after you have not eaten for a certain period of time (fasting). If you are not overweight and you do not have risk factors for diabetes, you should be screened once every 3 years starting at age 45. If you are overweight or obese and you are 40-70 years of age, you should be screened for diabetes every year as part of your cardiovascular risk assessment.  Breast cancer screening is essential preventive care for women. You should practice "breast self-awareness." This means understanding the normal appearance and feel of your breasts and may include breast self-examination. Any changes detected, no matter how small, should be reported to a health care provider. Women in their 20s and 30s should have a clinical breast exam (CBE) by a health care provider as part of a regular health exam every 1 to 3 years. After age 40, women should have a CBE every year. Starting at age 40, women should consider having a mammogram (breast X-ray test) every year. Women who have a family history of breast cancer should talk to their health care provider about genetic screening. Women at a high risk of breast cancer should talk to their health care providers about having an MRI and a mammogram every year.  Breast cancer gene (BRCA)-related cancer risk assessment is recommended for women who have family members with BRCA-related cancers. BRCA-related cancers include breast, ovarian, tubal, and peritoneal cancers. Having family members with these cancers may be associated with an increased risk for harmful changes (mutations) in the breast cancer genes BRCA1 and  BRCA2. Results of the assessment will determine the need for genetic counseling and BRCA1 and BRCA2 testing.  Your health care provider may recommend that you be screened regularly for cancer of the pelvic organs (ovaries, uterus, and vagina). This screening involves a pelvic examination, including checking for microscopic changes to the surface of your cervix (Pap test). You may be encouraged to have this screening done every 3 years, beginning at age 21.  For women ages 30-65, health care providers may recommend pelvic exams and Pap testing every 3 years, or they may recommend the Pap and pelvic exam, combined with testing for human papilloma virus (HPV), every 5 years. Some types of HPV increase your risk of cervical cancer. Testing for HPV may also be done on women of any age with unclear Pap test results.  Other health care providers may not recommend any screening for nonpregnant women who are considered low risk for pelvic cancer and who do not have symptoms. Ask your health care provider if a screening pelvic exam is right for   you.  If you have had past treatment for cervical cancer or a condition that could lead to cancer, you need Pap tests and screening for cancer for at least 20 years after your treatment. If Pap tests have been discontinued, your risk factors (such as having a new sexual partner) need to be reassessed to determine if screening should resume. Some women have medical problems that increase the chance of getting cervical cancer. In these cases, your health care provider may recommend more frequent screening and Pap tests.  Colorectal cancer can be detected and often prevented. Most routine colorectal cancer screening begins at the age of 50 years and continues through age 75 years. However, your health care provider may recommend screening at an earlier age if you have risk factors for colon cancer. On a yearly basis, your health care provider may provide home test kits to check  for hidden blood in the stool. Use of a small camera at the end of a tube, to directly examine the colon (sigmoidoscopy or colonoscopy), can detect the earliest forms of colorectal cancer. Talk to your health care provider about this at age 50, when routine screening begins. Direct exam of the colon should be repeated every 5-10 years through age 75 years, unless early forms of precancerous polyps or small growths are found.  People who are at an increased risk for hepatitis B should be screened for this virus. You are considered at high risk for hepatitis B if:  You were born in a country where hepatitis B occurs often. Talk with your health care provider about which countries are considered high risk.  Your parents were born in a high-risk country and you have not received a shot to protect against hepatitis B (hepatitis B vaccine).  You have HIV or AIDS.  You use needles to inject street drugs.  You live with, or have sex with, someone who has hepatitis B.  You get hemodialysis treatment.  You take certain medicines for conditions like cancer, organ transplantation, and autoimmune conditions.  Hepatitis C blood testing is recommended for all people born from 1945 through 1965 and any individual with known risks for hepatitis C.  Practice safe sex. Use condoms and avoid high-risk sexual practices to reduce the spread of sexually transmitted infections (STIs). STIs include gonorrhea, chlamydia, syphilis, trichomonas, herpes, HPV, and human immunodeficiency virus (HIV). Herpes, HIV, and HPV are viral illnesses that have no cure. They can result in disability, cancer, and death.  You should be screened for sexually transmitted illnesses (STIs) including gonorrhea and chlamydia if:  You are sexually active and are younger than 24 years.  You are older than 24 years and your health care provider tells you that you are at risk for this type of infection.  Your sexual activity has changed  since you were last screened and you are at an increased risk for chlamydia or gonorrhea. Ask your health care provider if you are at risk.  If you are at risk of being infected with HIV, it is recommended that you take a prescription medicine daily to prevent HIV infection. This is called preexposure prophylaxis (PrEP). You are considered at risk if:  You are sexually active and do not regularly use condoms or know the HIV status of your partner(s).  You take drugs by injection.  You are sexually active with a partner who has HIV.  Talk with your health care provider about whether you are at high risk of being infected with HIV. If   you choose to begin PrEP, you should first be tested for HIV. You should then be tested every 3 months for as long as you are taking PrEP.  Osteoporosis is a disease in which the bones lose minerals and strength with aging. This can result in serious bone fractures or breaks. The risk of osteoporosis can be identified using a bone density scan. Women ages 67 years and over and women at risk for fractures or osteoporosis should discuss screening with their health care providers. Ask your health care provider whether you should take a calcium supplement or vitamin D to reduce the rate of osteoporosis.  Menopause can be associated with physical symptoms and risks. Hormone replacement therapy is available to decrease symptoms and risks. You should talk to your health care provider about whether hormone replacement therapy is right for you.  Use sunscreen. Apply sunscreen liberally and repeatedly throughout the day. You should seek shade when your shadow is shorter than you. Protect yourself by wearing long sleeves, pants, a wide-brimmed hat, and sunglasses year round, whenever you are outdoors.  Once a month, do a whole body skin exam, using a mirror to look at the skin on your back. Tell your health care provider of new moles, moles that have irregular borders, moles that  are larger than a pencil eraser, or moles that have changed in shape or color.  Stay current with required vaccines (immunizations).  Influenza vaccine. All adults should be immunized every year.  Tetanus, diphtheria, and acellular pertussis (Td, Tdap) vaccine. Pregnant women should receive 1 dose of Tdap vaccine during each pregnancy. The dose should be obtained regardless of the length of time since the last dose. Immunization is preferred during the 27th-36th week of gestation. An adult who has not previously received Tdap or who does not know her vaccine status should receive 1 dose of Tdap. This initial dose should be followed by tetanus and diphtheria toxoids (Td) booster doses every 10 years. Adults with an unknown or incomplete history of completing a 3-dose immunization series with Td-containing vaccines should begin or complete a primary immunization series including a Tdap dose. Adults should receive a Td booster every 10 years.  Varicella vaccine. An adult without evidence of immunity to varicella should receive 2 doses or a second dose if she has previously received 1 dose. Pregnant females who do not have evidence of immunity should receive the first dose after pregnancy. This first dose should be obtained before leaving the health care facility. The second dose should be obtained 4-8 weeks after the first dose.  Human papillomavirus (HPV) vaccine. Females aged 13-26 years who have not received the vaccine previously should obtain the 3-dose series. The vaccine is not recommended for use in pregnant females. However, pregnancy testing is not needed before receiving a dose. If a female is found to be pregnant after receiving a dose, no treatment is needed. In that case, the remaining doses should be delayed until after the pregnancy. Immunization is recommended for any person with an immunocompromised condition through the age of 61 years if she did not get any or all doses earlier. During the  3-dose series, the second dose should be obtained 4-8 weeks after the first dose. The third dose should be obtained 24 weeks after the first dose and 16 weeks after the second dose.  Zoster vaccine. One dose is recommended for adults aged 30 years or older unless certain conditions are present.  Measles, mumps, and rubella (MMR) vaccine. Adults born  before 1957 generally are considered immune to measles and mumps. Adults born in 1957 or later should have 1 or more doses of MMR vaccine unless there is a contraindication to the vaccine or there is laboratory evidence of immunity to each of the three diseases. A routine second dose of MMR vaccine should be obtained at least 28 days after the first dose for students attending postsecondary schools, health care workers, or international travelers. People who received inactivated measles vaccine or an unknown type of measles vaccine during 1963-1967 should receive 2 doses of MMR vaccine. People who received inactivated mumps vaccine or an unknown type of mumps vaccine before 1979 and are at high risk for mumps infection should consider immunization with 2 doses of MMR vaccine. For females of childbearing age, rubella immunity should be determined. If there is no evidence of immunity, females who are not pregnant should be vaccinated. If there is no evidence of immunity, females who are pregnant should delay immunization until after pregnancy. Unvaccinated health care workers born before 1957 who lack laboratory evidence of measles, mumps, or rubella immunity or laboratory confirmation of disease should consider measles and mumps immunization with 2 doses of MMR vaccine or rubella immunization with 1 dose of MMR vaccine.  Pneumococcal 13-valent conjugate (PCV13) vaccine. When indicated, a person who is uncertain of his immunization history and has no record of immunization should receive the PCV13 vaccine. All adults 65 years of age and older should receive this  vaccine. An adult aged 19 years or older who has certain medical conditions and has not been previously immunized should receive 1 dose of PCV13 vaccine. This PCV13 should be followed with a dose of pneumococcal polysaccharide (PPSV23) vaccine. Adults who are at high risk for pneumococcal disease should obtain the PPSV23 vaccine at least 8 weeks after the dose of PCV13 vaccine. Adults older than 67 years of age who have normal immune system function should obtain the PPSV23 vaccine dose at least 1 year after the dose of PCV13 vaccine.  Pneumococcal polysaccharide (PPSV23) vaccine. When PCV13 is also indicated, PCV13 should be obtained first. All adults aged 65 years and older should be immunized. An adult younger than age 65 years who has certain medical conditions should be immunized. Any person who resides in a nursing home or long-term care facility should be immunized. An adult smoker should be immunized. People with an immunocompromised condition and certain other conditions should receive both PCV13 and PPSV23 vaccines. People with human immunodeficiency virus (HIV) infection should be immunized as soon as possible after diagnosis. Immunization during chemotherapy or radiation therapy should be avoided. Routine use of PPSV23 vaccine is not recommended for American Indians, [place] Natives, or people younger than 65 years unless there are medical conditions that require PPSV23 vaccine. When indicated, people who have unknown immunization and have no record of immunization should receive PPSV23 vaccine. One-time revaccination 5 years after the first dose of PPSV23 is recommended for people aged 19-64 years who have chronic kidney failure, nephrotic syndrome, asplenia, or immunocompromised conditions. People who received 1-2 doses of PPSV23 before age 65 years should receive another dose of PPSV23 vaccine at age 65 years or later if at least 5 years have passed since the previous dose. Doses of PPSV23 are not  needed for people immunized with PPSV23 at or after age 65 years.  Meningococcal vaccine. Adults with asplenia or persistent complement component deficiencies should receive 2 doses of quadrivalent meningococcal conjugate (MenACWY-D) vaccine. The doses should be obtained   at least 2 months apart. Microbiologists working with certain meningococcal bacteria, [place] recruits, people at risk during an outbreak, and people who travel to or live in countries with a high rate of meningitis should be immunized. A first-year college student up through age 34 years who is living in a residence hall should receive a dose if she did not receive a dose on or after her 16th birthday. Adults who have certain high-risk conditions should receive one or more doses of vaccine.  Hepatitis A vaccine. Adults who wish to be protected from this disease, have certain high-risk conditions, work with hepatitis A-infected animals, work in hepatitis A research labs, or travel to or work in countries with a high rate of hepatitis A should be immunized. Adults who were previously unvaccinated and who anticipate close contact with an international adoptee during the first 60 days after arrival in the [place] from a country with a high rate of hepatitis A should be immunized.  Hepatitis B vaccine. Adults who wish to be protected from this disease, have certain high-risk conditions, may be exposed to blood or other infectious body fluids, are household contacts or sex partners of hepatitis B positive people, are clients or workers in certain care facilities, or travel to or work in countries with a high rate of hepatitis B should be immunized.  Haemophilus influenzae type b (Hib) vaccine. A previously unvaccinated person with asplenia or sickle cell disease or having a scheduled splenectomy should receive 1 dose of Hib vaccine. Regardless of previous immunization, a recipient of a hematopoietic stem cell transplant should receive a  3-dose series 6-12 months after her successful transplant. Hib vaccine is not recommended for adults with HIV infection. Preventive Services / Frequency Ages 35 to 4 years  Blood pressure check.** / Every 3-5 years.  Lipid and cholesterol check.** / Every 5 years beginning at age 60.  Clinical breast exam.** / Every 3 years for women in their 71s and 10s.  BRCA-related cancer risk assessment.** / For women who have family members with a BRCA-related cancer (breast, ovarian, tubal, or peritoneal cancers).  Pap test.** / Every 2 years from ages 76 through 26. Every 3 years starting at age 61 through age 76 or 93 with a history of 3 consecutive normal Pap tests.  HPV screening.** / Every 3 years from ages 37 through ages 60 to 51 with a history of 3 consecutive normal Pap tests.  Hepatitis C blood test.** / For any individual with known risks for hepatitis C.  Skin self-exam. / Monthly.  Influenza vaccine. / Every year.  Tetanus, diphtheria, and acellular pertussis (Tdap, Td) vaccine.** / Consult your health care provider. Pregnant women should receive 1 dose of Tdap vaccine during each pregnancy. 1 dose of Td every 10 years.  Varicella vaccine.** / Consult your health care provider. Pregnant females who do not have evidence of immunity should receive the first dose after pregnancy.  HPV vaccine. / 3 doses over 6 months, if 93 and younger. The vaccine is not recommended for use in pregnant females. However, pregnancy testing is not needed before receiving a dose.  Measles, mumps, rubella (MMR) vaccine.** / You need at least 1 dose of MMR if you were born in 1957 or later. You may also need a 2nd dose. For females of childbearing age, rubella immunity should be determined. If there is no evidence of immunity, females who are not pregnant should be vaccinated. If there is no evidence of immunity, females who are  pregnant should delay immunization until after pregnancy.  Pneumococcal  13-valent conjugate (PCV13) vaccine.** / Consult your health care provider.  Pneumococcal polysaccharide (PPSV23) vaccine.** / 1 to 2 doses if you smoke cigarettes or if you have certain conditions.  Meningococcal vaccine.** / 1 dose if you are age 68 to 8 years and a Market researcher living in a residence hall, or have one of several medical conditions, you need to get vaccinated against meningococcal disease. You may also need additional booster doses.  Hepatitis A vaccine.** / Consult your health care provider.  Hepatitis B vaccine.** / Consult your health care provider.  Haemophilus influenzae type b (Hib) vaccine.** / Consult your health care provider. Ages 7 to 53 years  Blood pressure check.** / Every year.  Lipid and cholesterol check.** / Every 5 years beginning at age 25 years.  Lung cancer screening. / Every year if you are aged 11-80 years and have a 30-pack-year history of smoking and currently smoke or have quit within the past 15 years. Yearly screening is stopped once you have quit smoking for at least 15 years or develop a health problem that would prevent you from having lung cancer treatment.  Clinical breast exam.** / Every year after age 48 years.  BRCA-related cancer risk assessment.** / For women who have family members with a BRCA-related cancer (breast, ovarian, tubal, or peritoneal cancers).  Mammogram.** / Every year beginning at age 41 years and continuing for as long as you are in good health. Consult with your health care provider.  Pap test.** / Every 3 years starting at age 65 years through age 37 or 70 years with a history of 3 consecutive normal Pap tests.  HPV screening.** / Every 3 years from ages 72 years through ages 60 to 40 years with a history of 3 consecutive normal Pap tests.  Fecal occult blood test (FOBT) of stool. / Every year beginning at age 21 years and continuing until age 5 years. You may not need to do this test if you get  a colonoscopy every 10 years.  Flexible sigmoidoscopy or colonoscopy.** / Every 5 years for a flexible sigmoidoscopy or every 10 years for a colonoscopy beginning at age 35 years and continuing until age 48 years.  Hepatitis C blood test.** / For all people born from [birth year] through 1965 and any individual with known risks for hepatitis C.  Skin self-exam. / Monthly.  Influenza vaccine. / Every year.  Tetanus, diphtheria, and acellular pertussis (Tdap/Td) vaccine.** / Consult your health care provider. Pregnant women should receive 1 dose of Tdap vaccine during each pregnancy. 1 dose of Td every 10 years.  Varicella vaccine.** / Consult your health care provider. Pregnant females who do not have evidence of immunity should receive the first dose after pregnancy.  Zoster vaccine.** / 1 dose for adults aged 30 years or older.  Measles, mumps, rubella (MMR) vaccine.** / You need at least 1 dose of MMR if you were born in 1957 or later. You may also need a second dose. For females of childbearing age, rubella immunity should be determined. If there is no evidence of immunity, females who are not pregnant should be vaccinated. If there is no evidence of immunity, females who are pregnant should delay immunization until after pregnancy.  Pneumococcal 13-valent conjugate (PCV13) vaccine.** / Consult your health care provider.  Pneumococcal polysaccharide (PPSV23) vaccine.** / 1 to 2 doses if you smoke cigarettes or if you have certain conditions.  Meningococcal vaccine.** /  Consult your health care provider.  Hepatitis A vaccine.** / Consult your health care provider.  Hepatitis B vaccine.** / Consult your health care provider.  Haemophilus influenzae type b (Hib) vaccine.** / Consult your health care provider. Ages 64 years and over  Blood pressure check.** / Every year.  Lipid and cholesterol check.** / Every 5 years beginning at age 23 years.  Lung cancer screening. / Every year if you  are aged 16-80 years and have a 30-pack-year history of smoking and currently smoke or have quit within the past 15 years. Yearly screening is stopped once you have quit smoking for at least 15 years or develop a health problem that would prevent you from having lung cancer treatment.  Clinical breast exam.** / Every year after age 74 years.  BRCA-related cancer risk assessment.** / For women who have family members with a BRCA-related cancer (breast, ovarian, tubal, or peritoneal cancers).  Mammogram.** / Every year beginning at age 44 years and continuing for as long as you are in good health. Consult with your health care provider.  Pap test.** / Every 3 years starting at age 58 years through age 22 or 39 years with 3 consecutive normal Pap tests. Testing can be stopped between 65 and 70 years with 3 consecutive normal Pap tests and no abnormal Pap or HPV tests in the past 10 years.  HPV screening.** / Every 3 years from ages 64 years through ages 70 or 61 years with a history of 3 consecutive normal Pap tests. Testing can be stopped between 65 and 70 years with 3 consecutive normal Pap tests and no abnormal Pap or HPV tests in the past 10 years.  Fecal occult blood test (FOBT) of stool. / Every year beginning at age 40 years and continuing until age 27 years. You may not need to do this test if you get a colonoscopy every 10 years.  Flexible sigmoidoscopy or colonoscopy.** / Every 5 years for a flexible sigmoidoscopy or every 10 years for a colonoscopy beginning at age 7 years and continuing until age 32 years.  Hepatitis C blood test.** / For all people born from [birth year] through 1967 and any individual with known risks for hepatitis C.  Osteoporosis screening.** / A one-time screening for women ages 30 years and over and women at risk for fractures or osteoporosis.  Skin self-exam. / Monthly.  Influenza vaccine. / Every year.  Tetanus, diphtheria, and acellular pertussis (Tdap/Td)  vaccine.** / 1 dose of Td every 10 years.  Varicella vaccine.** / Consult your health care provider.  Zoster vaccine.** / 1 dose for adults aged 35 years or older.  Pneumococcal 13-valent conjugate (PCV13) vaccine.** / Consult your health care provider.  Pneumococcal polysaccharide (PPSV23) vaccine.** / 1 dose for all adults aged 46 years and older.  Meningococcal vaccine.** / Consult your health care provider.  Hepatitis A vaccine.** / Consult your health care provider.  Hepatitis B vaccine.** / Consult your health care provider.  Haemophilus influenzae type b (Hib) vaccine.** / Consult your health care provider. ** Family history and personal history of risk and conditions may change your health care provider's recommendations.   This information is not intended to replace advice given to you by your health care provider. Make sure you discuss any questions you have with your health care provider.   Document Released: 11/16/2001 Document Revised: 10/11/2014 Document Reviewed: 02/15/2011 Elsevier Interactive Patient Education [employer].

## 2016-05-07 NOTE — Progress Notes (Signed)
Pre visit review using our clinic review tool, if applicable. No additional management support is needed unless otherwise documented below in the visit note. 

## 2016-05-07 NOTE — Assessment & Plan Note (Signed)

## 2016-05-11 ENCOUNTER — Other Ambulatory Visit (INDEPENDENT_AMBULATORY_CARE_PROVIDER_SITE_OTHER): Payer: Medicare Other

## 2016-05-11 DIAGNOSIS — Z0000 Encounter for general adult medical examination without abnormal findings: Secondary | ICD-10-CM

## 2016-05-11 DIAGNOSIS — E785 Hyperlipidemia, unspecified: Secondary | ICD-10-CM

## 2016-05-11 LAB — URINALYSIS, ROUTINE W REFLEX MICROSCOPIC
Bilirubin Urine: NEGATIVE
HGB URINE DIPSTICK: NEGATIVE
Ketones, ur: NEGATIVE
Nitrite: NEGATIVE
RBC / HPF: NONE SEEN (ref 0–?)
SPECIFIC GRAVITY, URINE: 1.015 (ref 1.000–1.030)
Total Protein, Urine: NEGATIVE
UROBILINOGEN UA: 0.2 (ref 0.0–1.0)
Urine Glucose: NEGATIVE
pH: 7.5 (ref 5.0–8.0)

## 2016-05-11 LAB — BASIC METABOLIC PANEL
BUN: 18 mg/dL (ref 6–23)
CHLORIDE: 103 meq/L (ref 96–112)
CO2: 31 mEq/L (ref 19–32)
CREATININE: 0.78 mg/dL (ref 0.40–1.20)
Calcium: 9.2 mg/dL (ref 8.4–10.5)
GFR: 78.38 mL/min (ref >60.00)
Glucose, Bld: 97 mg/dL (ref 70–99)
POTASSIUM: 4.9 meq/L (ref 3.5–5.1)
Sodium: 141 mEq/L (ref 135–145)

## 2016-05-11 LAB — CBC WITH DIFFERENTIAL/PLATELET
BASOS PCT: 0.6 % (ref 0.0–3.0)
Basophils Absolute: 0 10*3/uL (ref 0.0–0.1)
EOS ABS: 0.2 10*3/uL (ref 0.0–0.7)
Eosinophils Relative: 4.2 % (ref 0.0–5.0)
HEMATOCRIT: 38.4 % (ref 36.0–46.0)
HEMOGLOBIN: 13.2 g/dL (ref 12.0–15.0)
LYMPHS PCT: 27.7 % (ref 12.0–46.0)
Lymphs Abs: 1.4 10*3/uL (ref 0.7–4.0)
MCHC: 34.4 g/dL (ref 30.0–36.0)
MCV: 84.6 fl (ref 78.0–100.0)
MONO ABS: 0.3 10*3/uL (ref 0.1–1.0)
Monocytes Relative: 4.9 % (ref 3.0–12.0)
Neutro Abs: 3.2 10*3/uL (ref 1.4–7.7)
Neutrophils Relative %: 62.6 % (ref 43.0–77.0)
Platelets: 228 10*3/uL (ref 150.0–400.0)
RBC: 4.54 Mil/uL (ref 3.87–5.11)
RDW: 13.8 % (ref 11.5–15.5)
WBC: 5.1 10*3/uL (ref 4.0–10.5)

## 2016-05-11 LAB — LIPID PANEL
CHOL/HDL RATIO: 5
Cholesterol: 200 mg/dL (ref 0–200)
HDL: 36.9 mg/dL — ABNORMAL LOW (ref >39.00)
LDL CALC: 130 mg/dL — AB (ref 0–99)
NONHDL: 162.67
TRIGLYCERIDES: 164 mg/dL — AB (ref 0.0–149.0)
VLDL: 32.8 mg/dL (ref 0.0–40.0)

## 2016-05-11 LAB — HEPATIC FUNCTION PANEL
ALK PHOS: 82 U/L (ref 39–117)
ALT: 20 U/L (ref 0–35)
AST: 18 U/L (ref 0–37)
Albumin: 4.2 g/dL (ref 3.5–5.2)
BILIRUBIN DIRECT: 0.1 mg/dL (ref 0.0–0.3)
BILIRUBIN TOTAL: 0.3 mg/dL (ref 0.2–1.2)
TOTAL PROTEIN: 7.3 g/dL (ref 6.0–8.3)

## 2016-05-11 LAB — TSH: TSH: 2.36 u[IU]/mL (ref 0.35–4.50)

## 2016-05-12 LAB — HEPATITIS C ANTIBODY: HCV Ab: NEGATIVE

## 2016-05-13 ENCOUNTER — Telehealth: Payer: Self-pay | Admitting: *Deleted

## 2016-05-13 DIAGNOSIS — M5440 Lumbago with sciatica, unspecified side: Secondary | ICD-10-CM

## 2016-05-13 NOTE — Telephone Encounter (Signed)
Pt is requesting a referral to chiropractor, Dr. Raynelle Chary for her sciatica. I informed her about her recent elevated triglycerides, LDL and low HDL. She wants to know if you want her to start taking meds for this?

## 2016-05-14 NOTE — Telephone Encounter (Signed)
OK. Thx

## 2016-05-17 DIAGNOSIS — M5134 Other intervertebral disc degeneration, thoracic region: Secondary | ICD-10-CM | POA: Diagnosis not present

## 2016-05-17 DIAGNOSIS — M5136 Other intervertebral disc degeneration, lumbar region: Secondary | ICD-10-CM | POA: Diagnosis not present

## 2016-05-17 DIAGNOSIS — M9905 Segmental and somatic dysfunction of pelvic region: Secondary | ICD-10-CM | POA: Diagnosis not present

## 2016-05-17 DIAGNOSIS — M9903 Segmental and somatic dysfunction of lumbar region: Secondary | ICD-10-CM | POA: Diagnosis not present

## 2016-05-17 DIAGNOSIS — M9902 Segmental and somatic dysfunction of thoracic region: Secondary | ICD-10-CM | POA: Diagnosis not present

## 2016-05-17 MED ORDER — ROSUVASTATIN CALCIUM 5 MG PO TABS: ORAL_TABLET | ORAL | 3 refills | Status: DC

## 2016-05-17 NOTE — Telephone Encounter (Signed)
It is a soft call we can try Crestor 5 mg M-W-F LFT, lipids in 3 mo Thx

## 2016-05-17 NOTE — Telephone Encounter (Signed)
Please review 05/11/16 lipid panel. Pt wants to know if she should be taking cholesterol meds. Please advise.

## 2016-05-18 NOTE — Telephone Encounter (Signed)
Tried calling pt no answer & can't leave vm due to not set-up...Tina Bird

## 2016-05-18 NOTE — Telephone Encounter (Signed)
Patient called in. Advised,. She understood

## 2016-05-19 MED ORDER — ATORVASTATIN CALCIUM 10 MG PO TABS: 10.0000 mg | ORAL_TABLET | Freq: Every day | ORAL | 11 refills | Status: DC

## 2016-05-19 NOTE — Telephone Encounter (Signed)
Pt was wondering if Dr. Camila Li can change Crestor to something cheaper. Pt also asking what might cause her BP to go up in the last 6 months? Stacy please call her back

## 2016-05-19 NOTE — Addendum Note (Signed)
Addended by: Cassandria Anger on: 05/19/2016 05:10 PM   Modules accepted: Orders

## 2016-05-19 NOTE — Telephone Encounter (Signed)
OK Lipitor Pls bbring BP records next time pls. NAS diet Thx

## 2016-05-20 DIAGNOSIS — M5134 Other intervertebral disc degeneration, thoracic region: Secondary | ICD-10-CM | POA: Diagnosis not present

## 2016-05-20 DIAGNOSIS — M9902 Segmental and somatic dysfunction of thoracic region: Secondary | ICD-10-CM | POA: Diagnosis not present

## 2016-05-20 DIAGNOSIS — M5136 Other intervertebral disc degeneration, lumbar region: Secondary | ICD-10-CM | POA: Diagnosis not present

## 2016-05-20 DIAGNOSIS — M9903 Segmental and somatic dysfunction of lumbar region: Secondary | ICD-10-CM | POA: Diagnosis not present

## 2016-05-20 DIAGNOSIS — M9905 Segmental and somatic dysfunction of pelvic region: Secondary | ICD-10-CM | POA: Diagnosis not present

## 2016-05-20 MED ORDER — ATORVASTATIN CALCIUM 10 MG PO TABS: 10.0000 mg | ORAL_TABLET | Freq: Every day | ORAL | 11 refills | Status: DC

## 2016-05-20 NOTE — Addendum Note (Signed)
Addended by: Cresenciano Lick on: 05/20/2016 08:44 AM   Modules accepted: Orders

## 2016-05-20 NOTE — Telephone Encounter (Signed)
Pt informed about Lipitor and to record BP. She c/o left hip pain. She went to the chiropractor and was adjusted. She still has significant pain and is using ibuprofen and a heating pad with little relief. She wants to know if she should see someone else or maybe have a xray. Please advise.

## 2016-05-20 NOTE — Telephone Encounter (Signed)
I can see her Thx

## 2016-05-21 NOTE — Telephone Encounter (Signed)
Appt has been scheduled.

## 2016-05-21 NOTE — Telephone Encounter (Signed)
Can you call and schedule pt to come in please?

## 2016-05-24 ENCOUNTER — Other Ambulatory Visit: Payer: Self-pay | Admitting: Internal Medicine

## 2016-05-24 DIAGNOSIS — Z9189 Other specified personal risk factors, not elsewhere classified: Secondary | ICD-10-CM

## 2016-05-24 DIAGNOSIS — Z1231 Encounter for screening mammogram for malignant neoplasm of breast: Secondary | ICD-10-CM

## 2016-05-24 DIAGNOSIS — M9902 Segmental and somatic dysfunction of thoracic region: Secondary | ICD-10-CM | POA: Diagnosis not present

## 2016-05-24 DIAGNOSIS — M5134 Other intervertebral disc degeneration, thoracic region: Secondary | ICD-10-CM | POA: Diagnosis not present

## 2016-05-24 DIAGNOSIS — M9903 Segmental and somatic dysfunction of lumbar region: Secondary | ICD-10-CM | POA: Diagnosis not present

## 2016-05-24 DIAGNOSIS — M5136 Other intervertebral disc degeneration, lumbar region: Secondary | ICD-10-CM | POA: Diagnosis not present

## 2016-05-24 DIAGNOSIS — M9905 Segmental and somatic dysfunction of pelvic region: Secondary | ICD-10-CM | POA: Diagnosis not present

## 2016-05-25 ENCOUNTER — Encounter: Payer: Self-pay | Admitting: Internal Medicine

## 2016-05-25 ENCOUNTER — Ambulatory Visit (INDEPENDENT_AMBULATORY_CARE_PROVIDER_SITE_OTHER): Payer: Medicare Other | Admitting: Internal Medicine

## 2016-05-25 DIAGNOSIS — E785 Hyperlipidemia, unspecified: Secondary | ICD-10-CM | POA: Insufficient documentation

## 2016-05-25 DIAGNOSIS — M25552 Pain in left hip: Secondary | ICD-10-CM

## 2016-05-25 DIAGNOSIS — G8929 Other chronic pain: Secondary | ICD-10-CM

## 2016-05-25 DIAGNOSIS — I10 Essential (primary) hypertension: Secondary | ICD-10-CM

## 2016-05-25 HISTORY — DX: Essential (primary) hypertension: I10

## 2016-05-25 MED ORDER — TRIAMTERENE-HCTZ 37.5-25 MG PO TABS: 1.0000 | ORAL_TABLET | Freq: Every day | ORAL | 11 refills | Status: DC

## 2016-05-25 MED ORDER — ASPIRIN 81 MG PO CHEW: 81.0000 mg | CHEWABLE_TABLET | Freq: Every day | ORAL | 11 refills | Status: AC

## 2016-05-25 NOTE — Assessment & Plan Note (Addendum)
  1 sister had a CVA Lipitor if tol ASA 32

## 2016-05-25 NOTE — Assessment & Plan Note (Signed)
?  IT band syndrome Stretch

## 2016-05-25 NOTE — Progress Notes (Signed)
Subjective:  Patient ID: Tina Bird, female    DOB: April 26, 1951  Age: 65 y.o. MRN: [id number]  CC: Hip Pain (Left. Seeing chiropractor with some relief)   HPI Tina Bird presents for a L leg pain - below and lat knee - chiropractor helped F/u HTN - SBP 160-190 C/o hot flashes x2 mo F/u HTN and elevated lipids  Outpatient Medications Prior to Visit  Medication Sig Dispense Refill  . acetaminophen (TYLENOL) 325 MG tablet Take 2 tablets (650 mg total) by mouth every 6 (six) hours as needed for mild pain (or temp > 100).    [name] atorvastatin (LIPITOR) 10 MG tablet Take 1 tablet (10 mg total) by mouth daily. 30 tablet 11  . Cholecalciferol 1000 UNITS tablet Take 1,000 Units by mouth daily.      . clotrimazole-betamethasone (LOTRISONE) cream Apply 1 application topically 2 (two) times daily. 15 g 1  . escitalopram (LEXAPRO) 10 MG tablet Take 1 tablet (10 mg total) by mouth daily. Must make f/u appt fot future refills 30 tablet 5  . famotidine (PEPCID) 20 MG tablet Take 1 tablet (20 mg total) by mouth at bedtime. 90 tablet 3  . ibuprofen (ADVIL,MOTRIN) 200 MG tablet Take 400 mg by mouth every 6 (six) hours as needed for moderate pain.    . Magnesium Hydroxide (MAGNESIA PO) Take 1 tablet by mouth daily.    . meclizine (ANTIVERT) 25 MG tablet Take 12.5 mg by mouth 3 (three) times daily as needed. verti    . omeprazole (PRILOSEC) 40 MG capsule Take 1 capsule (40 mg total) by mouth daily. 90 capsule 3  . saccharomyces boulardii (FLORASTOR) 250 MG capsule You can buy this over the counter at any drug store, I would take for at least a week after your last antibiotic.    . vitamin B-12 (CYANOCOBALAMIN) 1000 MCG tablet Take 1,000 mcg by mouth daily.     No facility-administered medications prior to visit.     ROS Review of Systems  Constitutional: Positive for diaphoresis. Negative for activity change, appetite change, chills, fatigue and unexpected weight change.  HENT:  Negative for congestion, mouth sores and sinus pressure.   Eyes: Negative for visual disturbance.  Respiratory: Negative for cough and chest tightness.   Gastrointestinal: Negative for abdominal pain and nausea.  Genitourinary: Negative for difficulty urinating, frequency and vaginal pain.  Musculoskeletal: Positive for arthralgias. Negative for back pain and gait problem.  Skin: Negative for pallor and rash.  Neurological: Negative for dizziness, tremors, weakness, numbness and headaches.  Psychiatric/Behavioral: Negative for confusion and sleep disturbance.    Objective:  BP (!) 160/90   Pulse 78   Temp 98.9 F (37.2 C) (Oral)   Wt 151 lb (68.5 kg)   SpO2 97%   BMI 25.13 kg/m   BP Readings from Last 3 Encounters:  05/25/16 (!) 160/90  05/07/16 139/80  01/28/16 129/70    Wt Readings from Last 3 Encounters:  05/25/16 151 lb (68.5 kg)  05/07/16 152 lb (68.9 kg)  01/25/16 152 lb (68.9 kg)    Physical Exam  Constitutional: She appears well-developed. No distress.  HENT:  Head: Normocephalic.  Right Ear: External ear normal.  Left Ear: External ear normal.  Nose: Nose normal.  Mouth/Throat: Oropharynx is clear and moist.  Eyes: Conjunctivae are normal. Pupils are equal, round, and reactive to light. Right eye exhibits no discharge. Left eye exhibits no discharge.  Neck: Normal range of motion. Neck supple. No JVD present. No  tracheal deviation present. No thyromegaly present.  Cardiovascular: Normal rate, regular rhythm and normal heart sounds.   Pulmonary/Chest: No stridor. No respiratory distress. She has no wheezes.  Abdominal: Soft. Bowel sounds are normal. She exhibits no distension and no mass. There is no tenderness. There is no rebound and no guarding.  Musculoskeletal: She exhibits no edema or tenderness.  Lymphadenopathy:    She has no cervical adenopathy.  Neurological: She displays normal reflexes. No cranial nerve deficit. She exhibits normal muscle tone.  Coordination normal.  Skin: No rash noted. No erythema.  Psychiatric: She has a normal mood and affect. Her behavior is normal. Judgment and thought content normal.    Lab Results  Component Value Date   WBC 5.1 05/11/2016   HGB 13.2 05/11/2016   HCT 38.4 05/11/2016   PLT 228.0 05/11/2016   GLUCOSE 97 05/11/2016   CHOL 200 05/11/2016   TRIG 164.0 (H) 05/11/2016   HDL 36.90 (L) 05/11/2016   LDLDIRECT 138.5 11/13/2012   LDLCALC 130 (H) 05/11/2016   ALT 20 05/11/2016   AST 18 05/11/2016   NA 141 05/11/2016   K 4.9 05/11/2016   CL 103 05/11/2016   CREATININE 0.78 05/11/2016   BUN 18 05/11/2016   CO2 31 05/11/2016   TSH 2.36 05/11/2016    Ct Abdomen Pelvis W Contrast  Result Date: 01/25/2016 CLINICAL DATA:  Abdominal pain x 1 week, dx bladder infection finished antibiotics now pain is worse, lower abdominal pain, hx diverticulitis, denies n/v/d, c/o bloating worse after eating^139mL ISOVUE-300 IOPAMIDOL (ISOVUE-300) INJECTION 61% EXAM: CT ABDOMEN AND PELVIS WITH CONTRAST TECHNIQUE: Multidetector CT imaging of the abdomen and pelvis was performed using the standard protocol following bolus administration of intravenous contrast. CONTRAST:  112mL ISOVUE-300 IOPAMIDOL (ISOVUE-300) INJECTION 61% COMPARISON:  None. FINDINGS: Lower chest: No pulmonary nodules, pleural effusions, or infiltrates. Heart size is normal. No imaged pericardial effusion or significant coronary artery calcifications. Upper abdomen: No focal abnormality identified within the liver, spleen, pancreas, or adrenal glands. Gallbladder is absent. There small renal cysts bilaterally.  No hydronephrosis. Gastrointestinal tract: Small hiatal hernia is present. The stomach otherwise has a normal appearance. Small bowel loops are normal in appearance. There are scattered colonic diverticula but no CT evidence for acute diverticulitis. The appendix is thickened, measuring 19 mm. Appendicolith is present. The adjacent terminal ileum is  normal in appearance. There is small amount of fluid surrounding the appendix but no abscess or free air. Pelvis: The uterus is present. The left ovary has a normal appearance. The right ovary is adjacent to the inflamed appendix but otherwise normal in appearance. There is no free pelvic fluid. Small amount of stranding within the right pericolic gutter. Urinary bladder is distended but otherwise normal in appearance. Retroperitoneum: Right lower quadrant mesenteric lymph nodes are identified, largest measuring approximately 1.9 cm. Lymph nodes are felt to be reactive no significant retroperitoneal adenopathy. There is minimal atherosclerotic calcification of the abdominal aorta. No aneurysm. Abdominal wall: Unremarkable. Osseous structures: Degenerative changes are identified primarily at L5-S1. No suspicious lytic or blastic lesions are identified. IMPRESSION: 1. Findings are consistent with acute appendicitis. Appendicolith is present. 2. Small hiatal hernia. 3. Colonic diverticulosis without acute diverticulitis. 4. Probably reactive right lower quadrant mesenteric lymph nodes. These results will be called to the ordering clinician or representative by the Radiologist Assistant, and communication documented in the PACS or zVision Dashboard. Electronically Signed   By: Nolon Nations M.D.   On: 01/25/2016 15:42    Assessment &  Plan:   There are no diagnoses linked to this encounter. I am having Ms. Bird maintain her meclizine, Cholecalciferol, vitamin B-12, clotrimazole-betamethasone, omeprazole, ibuprofen, Magnesium Hydroxide (MAGNESIA PO), acetaminophen, saccharomyces boulardii, famotidine, escitalopram, and atorvastatin.  No orders of the defined types were placed in this encounter.    Follow-up: No Follow-up on file.  Walker Kehr, MD

## 2016-05-25 NOTE — Assessment & Plan Note (Signed)
start Maxzide

## 2016-05-25 NOTE — Progress Notes (Signed)
Pre visit review using our clinic review tool, if applicable. No additional management support is needed unless otherwise documented below in the visit note. 

## 2016-06-03 ENCOUNTER — Ambulatory Visit
Admission: RE | Admit: 2016-06-03 | Discharge: 2016-06-03 | Disposition: A | Discharge status: Home / Self Care | Payer: Medicare Other | Source: Ambulatory Visit | Attending: Internal Medicine | Admitting: Internal Medicine

## 2016-06-03 DIAGNOSIS — Z1231 Encounter for screening mammogram for malignant neoplasm of breast: Secondary | ICD-10-CM | POA: Diagnosis not present

## 2016-06-03 DIAGNOSIS — Z9189 Other specified personal risk factors, not elsewhere classified: Secondary | ICD-10-CM

## 2016-07-15 ENCOUNTER — Ambulatory Visit (INDEPENDENT_AMBULATORY_CARE_PROVIDER_SITE_OTHER): Payer: Medicare Other

## 2016-07-15 DIAGNOSIS — Z23 Encounter for immunization: Secondary | ICD-10-CM

## 2016-07-19 ENCOUNTER — Other Ambulatory Visit: Payer: Self-pay | Admitting: Internal Medicine

## 2016-09-03 ENCOUNTER — Other Ambulatory Visit (INDEPENDENT_AMBULATORY_CARE_PROVIDER_SITE_OTHER): Payer: Medicare Other

## 2016-09-03 ENCOUNTER — Telehealth: Payer: Self-pay

## 2016-09-03 ENCOUNTER — Encounter: Payer: Self-pay | Admitting: Nurse Practitioner

## 2016-09-03 ENCOUNTER — Ambulatory Visit (INDEPENDENT_AMBULATORY_CARE_PROVIDER_SITE_OTHER): Payer: Medicare Other | Admitting: Nurse Practitioner

## 2016-09-03 VITALS — BP 124/74 | HR 82 | Temp 98.3°F | Ht 65.0 in | Wt 151.0 lb

## 2016-09-03 DIAGNOSIS — R42 Dizziness and giddiness: Secondary | ICD-10-CM

## 2016-09-03 DIAGNOSIS — R1032 Left lower quadrant pain: Secondary | ICD-10-CM

## 2016-09-03 LAB — CBC WITH DIFFERENTIAL/PLATELET
BASOS ABS: 0 10*3/uL (ref 0.0–0.1)
BASOS PCT: 0.6 % (ref 0.0–3.0)
EOS ABS: 0.2 10*3/uL (ref 0.0–0.7)
Eosinophils Relative: 2.5 % (ref 0.0–5.0)
HEMATOCRIT: 36.3 % (ref 36.0–46.0)
Hemoglobin: 12.4 g/dL (ref 12.0–15.0)
LYMPHS ABS: 1.6 10*3/uL (ref 0.7–4.0)
Lymphocytes Relative: 23 % (ref 12.0–46.0)
MCHC: 34 g/dL (ref 30.0–36.0)
MCV: 87.5 fl (ref 78.0–100.0)
MONO ABS: 0.3 10*3/uL (ref 0.1–1.0)
Monocytes Relative: 4.9 % (ref 3.0–12.0)
NEUTROS ABS: 4.9 10*3/uL (ref 1.4–7.7)
NEUTROS PCT: 69 % (ref 43.0–77.0)
PLATELETS: 247 10*3/uL (ref 150.0–400.0)
RBC: 4.15 Mil/uL (ref 3.87–5.11)
RDW: 13.2 % (ref 11.5–15.5)
WBC: 7.1 10*3/uL (ref 4.0–10.5)

## 2016-09-03 LAB — BASIC METABOLIC PANEL
BUN: 19 mg/dL (ref 6–23)
CALCIUM: 9.4 mg/dL (ref 8.4–10.5)
CHLORIDE: 98 meq/L (ref 96–112)
CO2: 33 meq/L — AB (ref 19–32)
CREATININE: 0.86 mg/dL (ref 0.40–1.20)
GFR: 69.96 mL/min (ref >60.00)
GLUCOSE: 101 mg/dL — AB (ref 70–99)
Potassium: 3.7 mEq/L (ref 3.5–5.1)
Sodium: 138 mEq/L (ref 135–145)

## 2016-09-03 LAB — URINALYSIS, ROUTINE W REFLEX MICROSCOPIC
Bilirubin Urine: NEGATIVE
Hgb urine dipstick: NEGATIVE
KETONES UR: NEGATIVE
Leukocytes, UA: NEGATIVE
Nitrite: NEGATIVE
PH: 6.5 (ref 5.0–8.0)
SPECIFIC GRAVITY, URINE: 1.015 (ref 1.000–1.030)
TOTAL PROTEIN, URINE-UPE24: NEGATIVE
UROBILINOGEN UA: 0.2 (ref 0.0–1.0)
Urine Glucose: NEGATIVE

## 2016-09-03 NOTE — Progress Notes (Signed)
Normal results: no need for oral abx at this time. Push all fluids and use tylenol as needed for pain.

## 2016-09-03 NOTE — Progress Notes (Signed)
Subjective:  Patient ID: Tina Bird, female    DOB: [birth year]  Age: 65 y.o. MRN: [id number]  CC: Pain (pain on left groin area in the last 3 days,extream dizziness upon ambulation. )  Lower Extremity Injury   Abdominal Pain  This is a new problem. The current episode started in the past 7 days. The onset quality is sudden. The problem occurs constantly. The problem has been gradually improving. The pain is located in the LLQ. The quality of the pain is dull and colicky. The abdominal pain does not radiate. Associated symptoms include constipation and flatus. Pertinent negatives include no anorexia, belching, diarrhea, dysuria, fever, frequency, headaches, hematochezia, hematuria, melena, myalgias, nausea, vomiting or weight loss. The pain is aggravated by palpation and certain positions. The pain is relieved by bowel movements and being still. She has tried nothing for the symptoms. Prior diagnostic workup includes CT scan (CT ABD/pelvis done 01/2016). Her past medical history is significant for irritable bowel syndrome. There is no history of colon cancer, GERD or ulcerative colitis.  Dizziness  This is a recurrent problem. The problem occurs intermittently. The problem has been unchanged. Associated symptoms include abdominal pain and vertigo. Pertinent negatives include no anorexia, chest pain, chills, congestion, fatigue, fever, headaches, myalgias, nausea, rash, sore throat, swollen glands, visual change, vomiting or weakness. The symptoms are aggravated by walking. She has tried nothing for the symptoms.  has hx of vertigo.  Outpatient Medications Prior to Visit  Medication Sig Dispense Refill  . acetaminophen (TYLENOL) 325 MG tablet Take 2 tablets (650 mg total) by mouth every 6 (six) hours as needed for mild pain (or temp > 100).    [name] aspirin (ASPIRIN CHILDRENS) 81 MG chewable tablet Chew 1 tablet (81 mg total) by mouth daily. 36 tablet 11  . atorvastatin (LIPITOR) 10 MG tablet  Take 1 tablet (10 mg total) by mouth daily. 30 tablet 11  . Cholecalciferol 1000 UNITS tablet Take 1,000 Units by mouth daily.      . clotrimazole-betamethasone (LOTRISONE) cream Apply 1 application topically 2 (two) times daily. 15 g 1  . escitalopram (LEXAPRO) 10 MG tablet Take 1 tablet (10 mg total) by mouth daily. Must make f/u appt fot future refills 30 tablet 5  . famotidine (PEPCID) 20 MG tablet Take 1 tablet (20 mg total) by mouth at bedtime. 90 tablet 3  . ibuprofen (ADVIL,MOTRIN) 200 MG tablet Take 400 mg by mouth every 6 (six) hours as needed for moderate pain.    . Magnesium Hydroxide (MAGNESIA PO) Take 1 tablet by mouth daily.    . meclizine (ANTIVERT) 25 MG tablet Take 12.5 mg by mouth 3 (three) times daily as needed. verti    . omeprazole (PRILOSEC) 40 MG capsule take 1 capsule by mouth once daily 90 capsule 3  . saccharomyces boulardii (FLORASTOR) 250 MG capsule You can buy this over the counter at any drug store, I would take for at least a week after your last antibiotic.    [name] triamterene-hydrochlorothiazide (MAXZIDE-25) 37.5-25 MG tablet Take 1 tablet by mouth daily. 30 tablet 11  . vitamin B-12 (CYANOCOBALAMIN) 1000 MCG tablet Take 1,000 mcg by mouth daily.     No facility-administered medications prior to visit.     ROS See HPI  Objective:  BP 124/74   Pulse 82   Temp 98.3 F (36.8 C)   Ht 5\' 5"  (1.651 m)   Wt 151 lb (68.5 kg)   SpO2 98%   BMI 25.13 kg/m  BP Readings from Last 3 Encounters:  09/03/16 124/74  05/25/16 (!) 160/90  05/07/16 139/80    Wt Readings from Last 3 Encounters:  09/03/16 151 lb (68.5 kg)  05/25/16 151 lb (68.5 kg)  05/07/16 152 lb (68.9 kg)    Physical Exam  Constitutional: She is oriented to person, place, and time. No distress.  HENT:  Mouth/Throat: No oropharyngeal exudate.  Eyes: Conjunctivae and EOM are normal. Pupils are equal, round, and reactive to light. No scleral icterus.  Neck: Normal range of motion. Neck supple.    Cardiovascular: Normal rate, regular rhythm and normal heart sounds.   Pulmonary/Chest: Effort normal and breath sounds normal.  Abdominal: Soft. Bowel sounds are normal. She exhibits no distension. There is no tenderness. There is no rebound and no guarding.  Musculoskeletal: She exhibits no edema.  Neurological: She is alert and oriented to person, place, and time.  Skin: Skin is warm and dry.  Psychiatric: She has a normal mood and affect. Her behavior is normal.  Vitals reviewed.  Physical Exam  Vitals reviewed. Constitutional: She is oriented to person, place, and time. No distress.  HENT:  Mouth/Throat: No oropharyngeal exudate.  Eyes: Conjunctivae and EOM are normal. Pupils are equal, round, and reactive to light. No scleral icterus.  Neck: Normal range of motion. Neck supple.  Cardiovascular: Normal rate, regular rhythm and normal heart sounds.   Respiratory: Effort normal and breath sounds normal.  GI: Soft. Bowel sounds are normal. She exhibits no distension. There is no tenderness. There is no rebound and no guarding.  Musculoskeletal: She exhibits no edema.  Neurological: She is alert and oriented to person, place, and time.  Skin: Skin is warm and dry.  Psychiatric: She has a normal mood and affect. Her behavior is normal.   Lab Results  Component Value Date   WBC 7.1 09/03/2016   HGB 12.4 09/03/2016   HCT 36.3 09/03/2016   PLT 247.0 09/03/2016   GLUCOSE 101 (H) 09/03/2016   CHOL 200 05/11/2016   TRIG 164.0 (H) 05/11/2016   HDL 36.90 (L) 05/11/2016   LDLDIRECT 138.5 11/13/2012   LDLCALC 130 (H) 05/11/2016   ALT 20 05/11/2016   AST 18 05/11/2016   NA 138 09/03/2016   K 3.7 09/03/2016   CL 98 09/03/2016   CREATININE 0.86 09/03/2016   BUN 19 09/03/2016   CO2 33 (H) 09/03/2016   TSH 2.36 05/11/2016    Mm Digital Screening Bilateral  Result Date: 06/04/2016 CLINICAL DATA:  Screening. EXAM: DIGITAL SCREENING BILATERAL MAMMOGRAM WITH CAD COMPARISON:  None. ACR  Breast Density Category c: The breast tissue is heterogeneously dense, which may obscure small masses FINDINGS: There are no findings suspicious for malignancy. Images were processed with CAD. IMPRESSION: No mammographic evidence of malignancy. A result letter of this screening mammogram will be mailed directly to the patient. RECOMMENDATION: Screening mammogram in one year. (Code:SM-B-01Y) BI-RADS CATEGORY  1: Negative. Electronically Signed   By: Lajean Manes M.D.   On: 06/04/2016 14:43    Assessment & Plan:   Jernell was seen today for pain.  Diagnoses and all orders for this visit:  Colicky LLQ abdominal pain -     Basic Metabolic Panel (BMET); Future -     CBC w/Diff; Future -     Urinalysis, Routine w reflex microscopic; Future  Dizziness -     Basic Metabolic Panel (BMET); Future -     CBC w/Diff; Future -     Urinalysis, Routine w reflex  microscopic; Future   I am having [name] maintain her meclizine, Cholecalciferol, vitamin B-12, clotrimazole-betamethasone, ibuprofen, Magnesium Hydroxide (MAGNESIA PO), acetaminophen, saccharomyces boulardii, famotidine, escitalopram, atorvastatin, triamterene-hydrochlorothiazide, aspirin, and omeprazole.  No orders of the defined types were placed in this encounter.  Recent Results (from the past 2160 hour(s))  Basic Metabolic Panel (BMET)     Status: Abnormal   Collection Time: 09/03/16  2:36 PM  Result Value Ref Range   Sodium 138 135 - 145 mEq/L   Potassium 3.7 3.5 - 5.1 mEq/L   Chloride 98 96 - 112 mEq/L   CO2 33 (H) 19 - 32 mEq/L   Glucose, Bld 101 (H) 70 - 99 mg/dL   BUN 19 6 - 23 mg/dL   Creatinine, Ser 0.86 0.40 - 1.20 mg/dL   Calcium 9.4 8.4 - 10.5 mg/dL   GFR 69.96 >60.00 mL/min  CBC w/Diff     Status: None   Collection Time: 09/03/16  2:36 PM  Result Value Ref Range   WBC 7.1 4.0 - 10.5 K/uL   RBC 4.15 3.87 - 5.11 Mil/uL   Hemoglobin 12.4 12.0 - 15.0 g/dL   HCT 36.3 36.0 - 46.0 %   MCV 87.5 78.0 - 100.0 fl    MCHC 34.0 30.0 - 36.0 g/dL   RDW 13.2 11.5 - 15.5 %   Platelets 247.0 150.0 - 400.0 K/uL   Neutrophils Relative % 69.0 43.0 - 77.0 %   Lymphocytes Relative 23.0 12.0 - 46.0 %   Monocytes Relative 4.9 3.0 - 12.0 %   Eosinophils Relative 2.5 0.0 - 5.0 %   Basophils Relative 0.6 0.0 - 3.0 %   Neutro Abs 4.9 1.4 - 7.7 K/uL   Lymphs Abs 1.6 0.7 - 4.0 K/uL   Monocytes Absolute 0.3 0.1 - 1.0 K/uL   Eosinophils Absolute 0.2 0.0 - 0.7 K/uL   Basophils Absolute 0.0 0.0 - 0.1 K/uL  Urinalysis, Routine w reflex microscopic     Status: Abnormal   Collection Time: 09/03/16  2:36 PM  Result Value Ref Range   Color, Urine YELLOW Yellow;Lt. Yellow   APPearance CLEAR Clear   Specific Gravity, Urine 1.015 1.000 - 1.030   pH 6.5 5.0 - 8.0   Total Protein, Urine NEGATIVE Negative   Urine Glucose NEGATIVE Negative   Ketones, ur NEGATIVE Negative   Bilirubin Urine NEGATIVE Negative   Hgb urine dipstick NEGATIVE Negative   Urobilinogen, UA 0.2 0.0 - 1.0   Leukocytes, UA NEGATIVE Negative   Nitrite NEGATIVE Negative   WBC, UA 3-6/hpf (A) 0-2/hpf   RBC / HPF 0-2/hpf 0-2/hpf   Squamous Epithelial / LPF Rare(0-4/hpf) Rare(0-4/hpf)    Follow-up: Return if symptoms worsen or fail to improve.  Wilfred Lacy, NP

## 2016-09-03 NOTE — Patient Instructions (Addendum)
Use meclizine as prescribed. Push oral fluids.  Call office if no improvement in 48 hours.

## 2016-09-03 NOTE — Telephone Encounter (Signed)
Advised patient of [facility] note---patient is requesting [place] advise on dizziness part----is there anything on labs to suggest a cause for dizziness, routing to [place], I do not see an abnormal lab to suggest a reason for dizziness---please advise, I will call patient back on monday

## 2016-09-03 NOTE — Progress Notes (Signed)
Pre visit review using our clinic review tool, if applicable. No additional management support is needed unless otherwise documented below in the visit note. 

## 2016-09-03 NOTE — Telephone Encounter (Signed)
Dizziness is due to vertigo which is treated with meclizine. During her office visit my understanding is that she has this prescription at home. She needs to take prescription as prescribed, push oral fluids, and rest. She needs to change position slowly and avoid driving. If symptoms do not improve in 72 hours she is to let us know.

## 2016-09-03 NOTE — Telephone Encounter (Signed)
-----   Message from Flossie Buffy, NP sent at 09/03/2016  4:46 PM EST ----- Normal results: no need for oral abx at this time. Push all fluids and use tylenol as needed for pain.

## 2016-09-08 NOTE — Telephone Encounter (Signed)
Pt contacted regarding lab result and stated that she has been pushing more fluids and dizziness is much improved. Pt will call back PRN

## 2016-10-11 DIAGNOSIS — M5134 Other intervertebral disc degeneration, thoracic region: Secondary | ICD-10-CM | POA: Diagnosis not present

## 2016-10-11 DIAGNOSIS — M9902 Segmental and somatic dysfunction of thoracic region: Secondary | ICD-10-CM | POA: Diagnosis not present

## 2016-10-11 DIAGNOSIS — M9905 Segmental and somatic dysfunction of pelvic region: Secondary | ICD-10-CM | POA: Diagnosis not present

## 2016-10-11 DIAGNOSIS — M9903 Segmental and somatic dysfunction of lumbar region: Secondary | ICD-10-CM | POA: Diagnosis not present

## 2016-10-11 DIAGNOSIS — M5136 Other intervertebral disc degeneration, lumbar region: Secondary | ICD-10-CM | POA: Diagnosis not present

## 2016-12-01 DIAGNOSIS — M9905 Segmental and somatic dysfunction of pelvic region: Secondary | ICD-10-CM | POA: Diagnosis not present

## 2016-12-01 DIAGNOSIS — M9903 Segmental and somatic dysfunction of lumbar region: Secondary | ICD-10-CM | POA: Diagnosis not present

## 2016-12-01 DIAGNOSIS — M5136 Other intervertebral disc degeneration, lumbar region: Secondary | ICD-10-CM | POA: Diagnosis not present

## 2016-12-01 DIAGNOSIS — M5134 Other intervertebral disc degeneration, thoracic region: Secondary | ICD-10-CM | POA: Diagnosis not present

## 2016-12-01 DIAGNOSIS — M9902 Segmental and somatic dysfunction of thoracic region: Secondary | ICD-10-CM | POA: Diagnosis not present

## 2016-12-07 ENCOUNTER — Encounter: Payer: Self-pay | Admitting: Family

## 2016-12-07 ENCOUNTER — Ambulatory Visit (INDEPENDENT_AMBULATORY_CARE_PROVIDER_SITE_OTHER): Payer: Medicare Other | Admitting: Family

## 2016-12-07 ENCOUNTER — Other Ambulatory Visit (INDEPENDENT_AMBULATORY_CARE_PROVIDER_SITE_OTHER): Payer: Medicare Other

## 2016-12-07 DIAGNOSIS — G629 Polyneuropathy, unspecified: Secondary | ICD-10-CM

## 2016-12-07 LAB — HEMOGLOBIN A1C: HEMOGLOBIN A1C: 6 % (ref 4.6–6.5)

## 2016-12-07 LAB — COMPREHENSIVE METABOLIC PANEL
ALBUMIN: 4.2 g/dL (ref 3.5–5.2)
ALT: 20 U/L (ref 0–35)
AST: 18 U/L (ref 0–37)
Alkaline Phosphatase: 77 U/L (ref 39–117)
BUN: 19 mg/dL (ref 6–23)
CO2: 35 meq/L — AB (ref 19–32)
CREATININE: 0.76 mg/dL (ref 0.40–1.20)
Calcium: 9.4 mg/dL (ref 8.4–10.5)
Chloride: 100 mEq/L (ref 96–112)
GFR: 80.62 mL/min (ref >60.00)
GLUCOSE: 81 mg/dL (ref 70–99)
POTASSIUM: 3.9 meq/L (ref 3.5–5.1)
SODIUM: 139 meq/L (ref 135–145)
Total Bilirubin: 0.4 mg/dL (ref 0.2–1.2)
Total Protein: 7 g/dL (ref 6.0–8.3)

## 2016-12-07 LAB — VITAMIN B12: Vitamin B-12: 1111 pg/mL — ABNORMAL HIGH (ref 211–911)

## 2016-12-07 NOTE — Progress Notes (Signed)
Subjective:    Patient ID: Tina Bird, female    DOB: 02/26/1951, 66 y.o.   MRN: [id number]  Chief Complaint  Patient presents with  . Leg Pain    x6 months has had burning sensation in upper part of both leg, gets a sharp throbbing pain in lower left leg when working    HPI:  Tina Bird is a 66 y.o. female who  has a past medical history of Asthmatic bronchitis (09-08-12); Essential hypertension, benign (05/25/2016); GERD (gastroesophageal reflux disease); LBP (low back pain); Melanoma ([place]); Menopausal symptoms; Osteoarthritis (07/27/2011); and Vertigo. and presents today for an office visit.  This is a new problem. Associated symptom of a burning sensation located in her bilateral lower extremities along the area of the bilateral vastus medialis oblique and also on the calf of the lower leg in the proximal lateral 1/3. No trauma or injury. Aggravated by standing on it for long periods of time. Modifying factors include a warm bath which she believes helps. The upper portion is constant and the lower waxes and wanes. Previously diagnosed with lower back pain which she describes with sciatica. Remains active with no limitations. No current back pain.   No Known Allergies    Outpatient Medications Prior to Visit  Medication Sig Dispense Refill  . acetaminophen (TYLENOL) 325 MG tablet Take 2 tablets (650 mg total) by mouth every 6 (six) hours as needed for mild pain (or temp > 100).    [name] aspirin (ASPIRIN CHILDRENS) 81 MG chewable tablet Chew 1 tablet (81 mg total) by mouth daily. 36 tablet 11  . atorvastatin (LIPITOR) 10 MG tablet Take 1 tablet (10 mg total) by mouth daily. 30 tablet 11  . Cholecalciferol 1000 UNITS tablet Take 1,000 Units by mouth daily.      . clotrimazole-betamethasone (LOTRISONE) cream Apply 1 application topically 2 (two) times daily. 15 g 1  . escitalopram (LEXAPRO) 10 MG tablet Take 1 tablet (10 mg total) by mouth daily. Must make f/u appt fot  future refills 30 tablet 5  . famotidine (PEPCID) 20 MG tablet Take 1 tablet (20 mg total) by mouth at bedtime. 90 tablet 3  . ibuprofen (ADVIL,MOTRIN) 200 MG tablet Take 400 mg by mouth every 6 (six) hours as needed for moderate pain.    . Magnesium Hydroxide (MAGNESIA PO) Take 1 tablet by mouth daily.    . meclizine (ANTIVERT) 25 MG tablet Take 12.5 mg by mouth 3 (three) times daily as needed. verti    . omeprazole (PRILOSEC) 40 MG capsule take 1 capsule by mouth once daily 90 capsule 3  . saccharomyces boulardii (FLORASTOR) 250 MG capsule You can buy this over the counter at any drug store, I would take for at least a week after your last antibiotic.    [name] triamterene-hydrochlorothiazide (MAXZIDE-25) 37.5-25 MG tablet Take 1 tablet by mouth daily. 30 tablet 11  . vitamin B-12 (CYANOCOBALAMIN) 1000 MCG tablet Take 1,000 mcg by mouth daily.     No facility-administered medications prior to visit.       Past Surgical History:  Procedure Laterality Date  . CHOLECYSTECTOMY     laparoscopic  . DILATION AND CURETTAGE OF UTERUS    . LAPAROSCOPIC APPENDECTOMY N/A 01/25/2016   Procedure: APPENDECTOMY LAPAROSCOPIC;  Surgeon: Tina Klein, MD;  Location: [facility];  Service: General;  Laterality: N/A;  . PEXY  09/11/2012   Procedure: PEXY;  Surgeon: Tina Hector, MD;  Location: [facility];  Service: General;  Laterality: N/A;  . TONSILLECTOMY    . TRANSANAL HEMORRHOIDAL DEARTERIALIZATION  09/11/2012   Procedure: TRANSANAL HEMORRHOIDAL DEARTERIALIZATION;  Surgeon: Tina Hector, MD;  Location: [facility];  Service: General;  Laterality: N/A;  [place] hemorrhoidal ligation/pexy  . TUBAL LIGATION        Past Medical History:  Diagnosis Date  . Asthmatic bronchitis 09-08-12   03-07-12 bronchits x1  . Essential hypertension, benign 05/25/2016  . GERD (gastroesophageal reflux disease)   . LBP (low back pain)   . Melanoma ([place])    Right leg and Right breast  . Menopausal symptoms    Dr. Ree Bird  .  Osteoarthritis 07/27/2011   left knee  . Vertigo       Review of Systems  Constitutional: Negative for chills and fever.  Respiratory: Negative for chest tightness and shortness of breath.   Cardiovascular: Negative for chest pain, palpitations and leg swelling.  Neurological: Positive for numbness. Negative for weakness.      Objective:    BP (!) 150/84 (BP Location: Left Arm, Patient Position: Sitting, Cuff Size: Normal)   Pulse 76   Temp 98.1 F (36.7 C) (Oral)   Resp 16   Ht 5' 5"  (1.651 m)   Wt 156 lb (70.8 kg)   SpO2 93%   BMI 25.96 kg/m  Nursing note and vital signs reviewed.  Physical Exam  Constitutional: She is oriented to person, place, and time. She appears well-developed and well-nourished. No distress.  Cardiovascular: Normal rate, regular rhythm, normal heart sounds and intact distal pulses.   Pulmonary/Chest: Effort normal and breath sounds normal.  Musculoskeletal:  Bilateral thigh - no obvious deformity, discoloration, or edema. No palpable tenderness, crepitus, or deformity. Range of motion within normal limits. Strength is normal. Pulses and sensation are intact and appropriate. Ligamentous and meniscal testing of the knee is negative. Negative single leg stance test.  Neurological: She is alert and oriented to person, place, and time.  Skin: Skin is warm and dry.  Psychiatric: She has a normal mood and affect. Her behavior is normal. Judgment and thought content normal.       Assessment & Plan:   Problem List Items Addressed This Visit      Nervous and Auditory   Neuropathy (HCC)    Symptoms and exam appear consistent with neuropathy with no significant muscle skeletal findings. Obtain H99, metabolic profile, and hemoglobin A1c to rule out metabolic causes. Cannot rule out lumbar radiculopathy although exam today is benign. Consider starting gabapentin neurology if metabolic work up is negative.      Relevant Orders   B12   Hemoglobin A1c    Comp Met (CMET)       I am having Tina Bird maintain her meclizine, Cholecalciferol, vitamin B-12, clotrimazole-betamethasone, ibuprofen, Magnesium Hydroxide (MAGNESIA PO), acetaminophen, saccharomyces boulardii, famotidine, escitalopram, atorvastatin, triamterene-hydrochlorothiazide, aspirin, and omeprazole.   No orders of the defined types were placed in this encounter.    Follow-up: Return if symptoms worsen or fail to improve.  Mauricio Po, FNP

## 2016-12-07 NOTE — Assessment & Plan Note (Signed)
Symptoms and exam appear consistent with neuropathy with no significant muscle skeletal findings. Obtain [id number], metabolic profile, and hemoglobin A1c to rule out metabolic causes. Cannot rule out lumbar radiculopathy although exam today is benign. Consider starting gabapentin neurology if metabolic work up is negative.

## 2016-12-07 NOTE — Patient Instructions (Signed)
Thank you for choosing [facility].  SUMMARY AND INSTRUCTIONS:  We will check your blood work to see any metabolic causes.   If your symptoms continue may require referral to neurology.  Labs:  Please stop by the lab on the lower level of the building for your blood work. Your results will be released to [place] (or called to you) after review, usually within 72 hours after test completion. If any changes need to be made, you will be notified at that same time.  1.) The lab is open from 7:30am to 5:30 pm Monday-Friday 2.) No appointment is necessary 3.) Fasting (if needed) is 6-8 hours after food and drink; black coffee and water are okay   Follow up:  If your symptoms worsen or fail to improve, please contact our office for further instruction, or in case of emergency go directly to the emergency room at the closest medical facility.

## 2016-12-13 ENCOUNTER — Telehealth: Payer: Self-pay | Admitting: Internal Medicine

## 2016-12-17 ENCOUNTER — Ambulatory Visit: Payer: Medicare Other | Admitting: Internal Medicine

## 2017-01-25 ENCOUNTER — Telehealth: Payer: Self-pay | Admitting: Internal Medicine

## 2017-01-25 NOTE — Telephone Encounter (Signed)
In the pt allergies section in her chart could you please add that she is allergic to [facility]. I am not authorized to do this. Thanks

## 2017-01-25 NOTE — Telephone Encounter (Signed)
Please advise 

## 2017-01-25 NOTE — Telephone Encounter (Signed)
Pt has gotten poison ivy, she would like something sent in for this.  [facility] on [address].

## 2017-01-26 ENCOUNTER — Ambulatory Visit (INDEPENDENT_AMBULATORY_CARE_PROVIDER_SITE_OTHER): Payer: Medicare Other | Admitting: Internal Medicine

## 2017-01-26 ENCOUNTER — Encounter: Payer: Self-pay | Admitting: Internal Medicine

## 2017-01-26 VITALS — Ht 65.0 in | Wt 155.0 lb

## 2017-01-26 DIAGNOSIS — Z9109 Other allergy status, other than to drugs and biological substances: Secondary | ICD-10-CM | POA: Diagnosis not present

## 2017-01-26 DIAGNOSIS — L259 Unspecified contact dermatitis, unspecified cause: Secondary | ICD-10-CM | POA: Insufficient documentation

## 2017-01-26 MED ORDER — TRIAMCINOLONE ACETONIDE 0.1 % EX CREA: 1.0000 "application " | TOPICAL_CREAM | Freq: Two times a day (BID) | CUTANEOUS | 0 refills | Status: DC

## 2017-01-26 MED ORDER — PREDNISONE 10 MG PO TABS: ORAL_TABLET | ORAL | 0 refills | Status: DC

## 2017-01-26 MED ORDER — METHYLPREDNISOLONE ACETATE 80 MG/ML IJ SUSP
80.0000 mg | Freq: Once | INTRAMUSCULAR | Status: AC
  Administered 2017-01-26: 80 mg via INTRAMUSCULAR

## 2017-01-26 NOTE — Progress Notes (Signed)
Subjective:    Patient ID: Tina Bird, female    DOB: 1950-12-07, 66 y.o.   MRN: [id number]  HPI  Here after working in the yard over the weekend, now with 2 days onset markedly itchy rash to arms, right neck and right leg near the knee. No pain or fever, no red streaks or drainage.  Cant stop scratching.  Has tried calamine.  Nothing seems to make better or worse Past Medical History:  Diagnosis Date  . Asthmatic bronchitis 09-08-12   03-07-12 bronchits x1  . Essential hypertension, benign 05/25/2016  . GERD (gastroesophageal reflux disease)   . LBP (low back pain)   . Melanoma ([place])    Right leg and Right breast  . Menopausal symptoms    Dr. Ree Edman  . Osteoarthritis 07/27/2011   left knee  . Vertigo    Past Surgical History:  Procedure Laterality Date  . CHOLECYSTECTOMY     laparoscopic  . DILATION AND CURETTAGE OF UTERUS    . LAPAROSCOPIC APPENDECTOMY N/A 01/25/2016   Procedure: APPENDECTOMY LAPAROSCOPIC;  Surgeon: Stark Klein, MD;  Location: [place];  Service: General;  Laterality: N/A;  . PEXY  09/11/2012   Procedure: PEXY;  Surgeon: Adin Hector, MD;  Location[facility];  Service: General;  Laterality: N/A;  . TONSILLECTOMY    . TRANSANAL HEMORRHOIDAL DEARTERIALIZATION  09/11/2012   Procedure: TRANSANAL HEMORRHOIDAL DEARTERIALIZATION;  Surgeon: Adin Hector, MD;  Location[facility];  Service: General;  Laterality: N/A;  [place] hemorrhoidal ligation/pexy  . TUBAL LIGATION      reports that she quit smoking about 15 years ago. Her smoking use included Cigarettes. She has a 15.00 pack-year smoking history. She has never used smokeless tobacco. She reports that she drinks about 0.6 oz of alcohol per week . She reports that she does not use drugs. family history includes Alzheimer's disease in her other; Diabetes in her mother; Heart disease in her brother; Hypertension in her mother and other; Stroke in her sister. Allergies  Allergen Reactions  . Poison Ivy Extract     Current Outpatient Prescriptions on File Prior to Visit  Medication Sig Dispense Refill  . acetaminophen (TYLENOL) 325 MG tablet Take 2 tablets (650 mg total) by mouth every 6 (six) hours as needed for mild pain (or temp > 100).    [name] aspirin (ASPIRIN CHILDRENS) 81 MG chewable tablet Chew 1 tablet (81 mg total) by mouth daily. 36 tablet 11  . atorvastatin (LIPITOR) 10 MG tablet Take 1 tablet (10 mg total) by mouth daily. 30 tablet 11  . Cholecalciferol 1000 UNITS tablet Take 1,000 Units by mouth daily.      . clotrimazole-betamethasone (LOTRISONE) cream Apply 1 application topically 2 (two) times daily. 15 g 1  . escitalopram (LEXAPRO) 10 MG tablet Take 1 tablet (10 mg total) by mouth daily. Must make f/u appt fot future refills 30 tablet 5  . famotidine (PEPCID) 20 MG tablet Take 1 tablet (20 mg total) by mouth at bedtime. 90 tablet 3  . ibuprofen (ADVIL,MOTRIN) 200 MG tablet Take 400 mg by mouth every 6 (six) hours as needed for moderate pain.    . Magnesium Hydroxide (MAGNESIA PO) Take 1 tablet by mouth daily.    . meclizine (ANTIVERT) 25 MG tablet Take 12.5 mg by mouth 3 (three) times daily as needed. verti    . omeprazole (PRILOSEC) 40 MG capsule take 1 capsule by mouth once daily 90 capsule 3  . saccharomyces boulardii (FLORASTOR) 250  MG capsule You can buy this over the counter at any drug store, I would take for at least a week after your last antibiotic.    [name] triamterene-hydrochlorothiazide (MAXZIDE-25) 37.5-25 MG tablet Take 1 tablet by mouth daily. 30 tablet 11  . vitamin B-12 (CYANOCOBALAMIN) 1000 MCG tablet Take 1,000 mcg by mouth daily.     No current facility-administered medications on file prior to visit.    Review of Systems All otherwise neg per pt    Objective:   Physical Exam Ht 5\' 5"  (1.651 m)   Wt 155 lb (70.3 kg)   BMI 25.79 kg/m  VS noted,  Constitutional: Pt appears in NAD HENT: Head: NCAT.  Right Ear: External ear normal.  Left Ear: External ear normal.   Eyes: . Pupils are equal, round, and reactive to light. Conjunctivae and EOM are normal Nose: without d/c or deformity Neck: Neck supple. Gross normal ROM Cardiovascular: Normal rate and regular rhythm.   Pulmonary/Chest: Effort normal and breath sounds without rales or wheezing.  Neurological: Pt is alert. At baseline orientation, motor grossly intact Skin: Skin is warm. other new lesions, no LE edema typical contact dermaitis like erythem raised rash with excoriations to the right forarm, right lateral neck and right lateral knee Psychiatric: Pt behavior is normal without agitation  No other exam findings    Assessment & Plan:

## 2017-01-26 NOTE — Patient Instructions (Signed)
You had the steroid shot today    Please take all new medication as prescribed - the prednisone, and the steroid cream if needed  Please continue all other medications as before, and refills have been done if requested.  Please have the pharmacy call with any other refills you may need.  Please keep your appointments with your specialists as you may have planned

## 2017-01-26 NOTE — Telephone Encounter (Signed)
Pt notified and seen Dr. Jenny Reichmann today

## 2017-01-26 NOTE — Telephone Encounter (Signed)
Use OTC meds Pls see a [place] provider ASAP Thx

## 2017-01-26 NOTE — Assessment & Plan Note (Signed)
Mild to mod, for depomedrol IM 80, predpac asd, triam cr prn,  to f/u any worsening symptoms or concerns

## 2017-01-26 NOTE — Progress Notes (Signed)
Pre visit review using our clinic review tool, if applicable. No additional management support is needed unless otherwise documented below in the visit note. 

## 2017-02-18 ENCOUNTER — Other Ambulatory Visit: Payer: Self-pay | Admitting: Internal Medicine

## 2017-02-18 DIAGNOSIS — Z1231 Encounter for screening mammogram for malignant neoplasm of breast: Secondary | ICD-10-CM

## 2017-02-21 ENCOUNTER — Other Ambulatory Visit: Payer: Self-pay | Admitting: Internal Medicine

## 2017-03-04 ENCOUNTER — Other Ambulatory Visit: Payer: Self-pay

## 2017-03-04 DIAGNOSIS — L820 Inflamed seborrheic keratosis: Secondary | ICD-10-CM | POA: Diagnosis not present

## 2017-03-04 DIAGNOSIS — Z85820 Personal history of malignant melanoma of skin: Secondary | ICD-10-CM | POA: Diagnosis not present

## 2017-03-04 DIAGNOSIS — D229 Melanocytic nevi, unspecified: Secondary | ICD-10-CM | POA: Diagnosis not present

## 2017-03-04 DIAGNOSIS — D492 Neoplasm of unspecified behavior of bone, soft tissue, and skin: Secondary | ICD-10-CM | POA: Diagnosis not present

## 2017-03-04 DIAGNOSIS — L432 Lichenoid drug reaction: Secondary | ICD-10-CM | POA: Diagnosis not present

## 2017-03-04 DIAGNOSIS — L821 Other seborrheic keratosis: Secondary | ICD-10-CM | POA: Diagnosis not present

## 2017-03-04 DIAGNOSIS — L570 Actinic keratosis: Secondary | ICD-10-CM | POA: Diagnosis not present

## 2017-03-21 DIAGNOSIS — M9903 Segmental and somatic dysfunction of lumbar region: Secondary | ICD-10-CM | POA: Diagnosis not present

## 2017-03-21 DIAGNOSIS — M9905 Segmental and somatic dysfunction of pelvic region: Secondary | ICD-10-CM | POA: Diagnosis not present

## 2017-03-21 DIAGNOSIS — M5134 Other intervertebral disc degeneration, thoracic region: Secondary | ICD-10-CM | POA: Diagnosis not present

## 2017-03-21 DIAGNOSIS — M5136 Other intervertebral disc degeneration, lumbar region: Secondary | ICD-10-CM | POA: Diagnosis not present

## 2017-03-21 DIAGNOSIS — M9902 Segmental and somatic dysfunction of thoracic region: Secondary | ICD-10-CM | POA: Diagnosis not present

## 2017-04-08 ENCOUNTER — Ambulatory Visit (INDEPENDENT_AMBULATORY_CARE_PROVIDER_SITE_OTHER): Payer: Medicare Other

## 2017-04-08 ENCOUNTER — Encounter: Payer: Self-pay | Admitting: Sports Medicine

## 2017-04-08 ENCOUNTER — Ambulatory Visit (INDEPENDENT_AMBULATORY_CARE_PROVIDER_SITE_OTHER): Payer: Medicare Other | Admitting: Sports Medicine

## 2017-04-08 VITALS — BP 122/84 | HR 73 | Ht 65.0 in | Wt 154.4 lb

## 2017-04-08 DIAGNOSIS — M5136 Other intervertebral disc degeneration, lumbar region: Secondary | ICD-10-CM | POA: Diagnosis not present

## 2017-04-08 DIAGNOSIS — M545 Low back pain: Secondary | ICD-10-CM

## 2017-04-08 DIAGNOSIS — M25552 Pain in left hip: Secondary | ICD-10-CM | POA: Diagnosis not present

## 2017-04-08 DIAGNOSIS — M25551 Pain in right hip: Secondary | ICD-10-CM | POA: Diagnosis not present

## 2017-04-08 MED ORDER — DICLOFENAC SODIUM 75 MG PO TBEC: 75.0000 mg | DELAYED_RELEASE_TABLET | Freq: Two times a day (BID) | ORAL | 0 refills | Status: DC

## 2017-04-08 NOTE — Progress Notes (Signed)
OFFICE VISIT NOTE Tina Bird. Yoanna Jurczyk, [place] at [facility] [phone]  NATAYA BASTEDO - 66 y.o. female MRN [id number]  Date of birth: 03-25-1951  Visit Date: 04/08/2017  PCP: Cassandria Anger, MD   Referred by: Cassandria Anger, MD  Burlene Arnt, CMA acting as scribe for Dr. Paulla Fore.  SUBJECTIVE:   Chief Complaint  Patient presents with  . Lower back, Hips, and Leg Pain   HPI: As below and per problem based documentation when appropriate.  Pt presents today with complaint of lower back, hip, and leg pain. She has concerns about arthritis.  Pain started about 3-4 months ago No known trauma or injury to the hips or lower back   The pain is described as constant dull aching and is rated as 8/10.  Worsened with sitting and laying down Improves with walking Therapies tried include Ibuprofen with some relief. She has also tried using an ice back on her back and gotten some relief.   Pt denies pain in the groin, mid-back or neck. Pain will occasionally radiate into the calves. She doesn't have trouble when going up or down stairs.   Pt denies fever, chills, night sweats.     Review of Systems  Constitutional: Negative for chills and fever.  Respiratory: Negative for shortness of breath and wheezing.   Cardiovascular: Negative for chest pain, palpitations and leg swelling.  Musculoskeletal: Positive for back pain. Negative for falls and neck pain.  Neurological: Negative for dizziness and headaches.  Endo/Heme/Allergies: Does not bruise/bleed easily.    Otherwise per HPI.  HISTORY & PERTINENT PRIOR DATA:  No specialty comments available. She reports that she quit smoking about 15 years ago. Her smoking use included Cigarettes. She has a 15.00 pack-year smoking history. She has never used smokeless tobacco.   Recent Labs  12/07/16 1145  HGBA1C 6.0   Medications & Allergies reviewed per EMR Patient Active Problem  List   Diagnosis Date Noted  . Contact dermatitis 01/26/2017  . Neuropathy 12/07/2016  . Essential hypertension, benign 05/25/2016  . Dyslipidemia 05/25/2016  . Acute appendicitis 01/25/2016  . Rash and nonspecific skin eruption 04/22/2015  . History of colonic polyps 10/08/2014  . Near syncope 11/05/2013  . Heart murmur 11/05/2013  . Well adult exam 10/30/2012  . Internal hemorrhoids with prolapse & pain 07/12/2012  . IBS (irritable bowel syndrome) 07/12/2012  . Diverticulosis of colon with sigmoid stricture 07/12/2012  . Cough 03/07/2012  . Asthmatic bronchitis 03/07/2012  . Hip pain, chronic 12/14/2011  . Glossal erythema 12/14/2011  . Osteoarthritis 07/27/2011  . HIP PAIN 12/02/2010  . MENOPAUSAL SYNDROME 06/02/2010  . DIZZINESS 06/02/2010  . LOW BACK PAIN 05/13/2007   Past Medical History:  Diagnosis Date  . Asthmatic bronchitis 09-08-12   03-07-12 bronchits x1  . Essential hypertension, benign 05/25/2016  . GERD (gastroesophageal reflux disease)   . LBP (low back pain)   . Melanoma ([place])    Right leg and Right breast  . Menopausal symptoms    Dr. Ree Edman  . Osteoarthritis 07/27/2011   left knee  . Vertigo    Family History  Problem Relation Age of Onset  . Heart disease Brother   . Diabetes Mother   . Hypertension Mother   . Stroke Sister   . Hypertension Other   . Alzheimer's disease Other    Past Surgical History:  Procedure Laterality Date  . CHOLECYSTECTOMY     laparoscopic  . DILATION  AND CURETTAGE OF UTERUS    . LAPAROSCOPIC APPENDECTOMY N/A 01/25/2016   Procedure: APPENDECTOMY LAPAROSCOPIC;  Surgeon: Stark Klein, MD;  Location: [place];  Service: General;  Laterality: N/A;  . PEXY  09/11/2012   Procedure: PEXY;  Surgeon: Adin Hector, MD;  Location[facility];  Service: General;  Laterality: N/A;  . TONSILLECTOMY    . TRANSANAL HEMORRHOIDAL DEARTERIALIZATION  09/11/2012   Procedure: TRANSANAL HEMORRHOIDAL DEARTERIALIZATION;  Surgeon: Adin Hector,  MD;  Location[facility];  Service: General;  Laterality: N/A;  [place] hemorrhoidal ligation/pexy  . TUBAL LIGATION     Social History   Occupational History  . Not on file.   Social History Main Topics  . Smoking status: Former Smoker    Packs/day: 0.50    Years: 30.00    Types: Cigarettes    Quit date: 10/04/2001  . Smokeless tobacco: Never Used  . Alcohol use 0.6 oz/week    1 Glasses of wine per week     Comment: rare wine  . Drug use: No  . Sexual activity: Yes    OBJECTIVE:  VS:  HT:5\' 5"  (165.1 cm)   WT:154 lb 6.4 oz (70 kg)  BMI:25.7    BP:122/84  HR:73bpm  TEMP: ( )  RESP:96 % EXAM: Findings:  WDWN, NAD, Non-toxic appearing Alert & appropriately interactive Not depressed or anxious appearing No increased work of breathing. Pupils are equal. EOM intact without nystagmus No clubbing or cyanosis of the extremities appreciated No significant rashes/lesions/ulcerations overlying the examined area. DP & PT pulses 2+/4.  No significant pretibial edema.  No clubbing or cyanosis Sensation intact to light touch in lower extremities.  Back & Lower Extremities: Bilateral negative straight leg raise. No significant midline tenderness.   Bilateral SI joints, no pain in the greater  sciatic notches.  No pain with popliteal compression test. Good internal and external rotation of the hips. Manual muscle testing is 5+/5 in BLE myotomes without focality Lower extremity DTRs 2+/4 diffusely and symmetric No significant pain with Stinchfield testing.  Hip abduction strength is intact although T FL predominant with poor glute medius recruitment.     Dg Lumbar Spine 2-3 Views  Result Date: 04/08/2017 CLINICAL DATA:  Low back pain EXAM: LUMBAR SPINE - 2-3 VIEW COMPARISON:  CT 01/25/2016 FINDINGS: Degenerative disc disease changes at L5-S1 with disc space narrowing and early spurring. Mild degenerative facet disease at L5-S1. Normal alignment. No fracture. IMPRESSION: Degenerative disc  and facet disease at the lumbosacral junction. No acute bony abnormality. Electronically Signed   By: Rolm Baptise M.D.   On: 04/08/2017 11:20   Dg Hips Bilat W Or W/o Pelvis Min 5 Views  Result Date: 04/08/2017 CLINICAL DATA:  Bilateral low back pain and hip pain for 4 months. EXAM: DG HIP (WITH OR WITHOUT PELVIS) 5+V BILAT COMPARISON:  None. FINDINGS: There is no evidence of hip fracture or dislocation. There is no evidence of arthropathy or other focal bone abnormality. IMPRESSION: Negative. Electronically Signed   By: Abelardo Diesel M.D.   On: 04/08/2017 11:20   ASSESSMENT & PLAN:     ICD-10-CM   1. Low back pain, unspecified back pain laterality, unspecified chronicity, with sciatica presence unspecified M54.5 DG Lumbar Spine 2-3 Views    DG HIPS BILAT W OR W/O PELVIS MIN 5 VIEWS    Ambulatory referral to Physical Therapy  ================================================================= LOW BACK PAIN Musculature is weak with no evidence of significant neurogenic etiology.  Core conditioning recommended and will  plan to refer to formal physical therapy and consideration of Pilates PT.  Also provided information regarding foundations therapy.  Follow-up in 8 weeks to ensure clinical improvement and reassess strength.  If any worsening symptoms can consider further diagnostic evaluation but this does seem to be functional in nature. ================================================================= Patient Instructions  I am referring you to [facility] on [address] for physical therapy.  I'm hoping that you can work with Guido Sander to do some pilates as well   Also check out the YouTube Video from Dr. Minerva Ends.  I would like to see you try performing this 5-6 days per week.   "Powerful Posture and Pain Relief: 12 minutes of Foundation Training" - [email or web address]   Do not try to attempt this entire video when first beginning.    Try breaking of each exercise that he goes into  shorter segments.  Otherwise if they perform an exercise for 45 seconds, start with 15 seconds and rest and then resume with a begin the new activity.  Work your way up to doing this 12 minute video and I expect to see significant improvements in your pain.  =================================================================  Follow-up: Return in about 6 weeks (around 05/20/2017).   CMA/ATC served as Education administrator during this visit. History, Physical, and Plan performed by medical provider. Documentation and orders reviewed and attested to.      Teresa Coombs, [place] [facility] Physician

## 2017-04-08 NOTE — Patient Instructions (Signed)
I am referring you to [facility] on [address] for physical therapy.  I'm hoping that you can work with Guido Sander to do some pilates as well   Also check out the YouTube Video from Dr. Minerva Ends.  I would like to see you try performing this 5-6 days per week.   "Powerful Posture and Pain Relief: 12 minutes of Foundation Training" - [email or web address].[email or web address]   Do not try to attempt this entire video when first beginning.    Try breaking of each exercise that he goes into shorter segments.  Otherwise if they perform an exercise for 45 seconds, start with 15 seconds and rest and then resume with a begin the new activity.  Work your way up to doing this 12 minute video and I expect to see significant improvements in your pain.

## 2017-04-14 ENCOUNTER — Encounter: Payer: Self-pay | Admitting: Physical Therapy

## 2017-04-14 ENCOUNTER — Ambulatory Visit: Payer: Medicare Other | Attending: Sports Medicine | Admitting: Physical Therapy

## 2017-04-14 DIAGNOSIS — M79605 Pain in left leg: Secondary | ICD-10-CM | POA: Insufficient documentation

## 2017-04-14 DIAGNOSIS — M545 Low back pain: Secondary | ICD-10-CM

## 2017-04-14 DIAGNOSIS — M79652 Pain in left thigh: Secondary | ICD-10-CM | POA: Insufficient documentation

## 2017-04-14 DIAGNOSIS — M79651 Pain in right thigh: Secondary | ICD-10-CM | POA: Diagnosis not present

## 2017-04-14 DIAGNOSIS — M79604 Pain in right leg: Secondary | ICD-10-CM | POA: Insufficient documentation

## 2017-04-14 NOTE — Therapy (Addendum)
[place], [place], [address] Phone: [phone]   Fax:  [phone]  Physical Therapy Evaluation  Patient Details  Name: Tina Bird MRN: [id number] Date of Birth: 1951/01/12 Referring Provider: Teresa Coombs  Encounter Date: 04/14/2017      PT End of Session - 04/14/17 1053    Visit Number 1   Date for PT Re-Evaluation 06/09/17   PT Start Time 0935   PT Stop Time 1030   PT Time Calculation (min) 55 min   Activity Tolerance Patient tolerated treatment well   Behavior During Therapy [facility] for tasks assessed/performed      Past Medical History:  Diagnosis Date  . Asthmatic bronchitis 09-08-12   03-07-12 bronchits x1  . Essential hypertension, benign 05/25/2016  . GERD (gastroesophageal reflux disease)   . LBP (low back pain)   . Melanoma ([place])    Right leg and Right breast  . Menopausal symptoms    Dr. Ree Edman  . Osteoarthritis 07/27/2011   left knee  . Vertigo     Past Surgical History:  Procedure Laterality Date  . CHOLECYSTECTOMY     laparoscopic  . DILATION AND CURETTAGE OF UTERUS    . LAPAROSCOPIC APPENDECTOMY N/A 01/25/2016   Procedure: APPENDECTOMY LAPAROSCOPIC;  Surgeon: Stark Klein, MD;  Location: [facility];  Service: General;  Laterality: N/A;  . PEXY  09/11/2012   Procedure: PEXY;  Surgeon: Adin Hector, MD;  Location: [facility];  Service: General;  Laterality: N/A;  . TONSILLECTOMY    . TRANSANAL HEMORRHOIDAL DEARTERIALIZATION  09/11/2012   Procedure: TRANSANAL HEMORRHOIDAL DEARTERIALIZATION;  Surgeon: Adin Hector, MD;  Location: [facility];  Service: General;  Laterality: N/A;  [place] hemorrhoidal ligation/pexy  . TUBAL LIGATION      There were no vitals filed for this visit.       Subjective Assessment - 04/14/17 0942    Subjective Pt reports that about 4-5 months ago she started noticing back pain first and then she begain having aching pain in her the front and  back of her legs but mostly in her buttock area and back of her legs. Her pain comes on with sitting or laying down. It is relieved with getting up and moving around.    Limitations Sitting;Walking   How long can you sit comfortably? No a specific time.    Patient Stated Goals No pain   Currently in Pain? Yes   Pain Score 8    Pain Location Back   Pain Orientation Lower   Pain Descriptors / Indicators Dull;Stabbing   Pain Type Acute pain   Pain Onset More than a month ago   Pain Frequency Constant   Aggravating Factors  Sitting, resting   Pain Relieving Factors Ice pack, moving around, walking.            [facility] PT Assessment - 04/14/17 0001      Assessment   Medical Diagnosis Low back pain and bilateral leg pain.   Referring Provider Teresa Coombs   Onset Date/Surgical Date --  4 or 5 months ago   Next MD Visit 6 weeks   Prior Therapy no     Precautions   Precautions None     Balance Screen   Has the patient fallen in the past 6 months No   Has the patient had a decrease in activity level because of a fear of falling?  No   Is the patient reluctant  to leave their home because of a fear of falling?  No     Home Environment   Living Environment Private residence   Living Arrangements Spouse/significant other   Additional Comments Does own housework and yardwork, has grandchildren but doesn't lift them.      Prior Function   Level of Independence Independent   Vocation Retired   Leisure Does yoga, is very active outside.      Sensation   Additional Comments LE dermatomes are intact and symmetrical     Posture/Postural Control   Posture Comments Decreased lumbar lordosis.     ROM / Strength   AROM / PROM / Strength AROM;Strength     AROM   Overall AROM Comments Lumbar AROM is WNL, does report back pain with extension and bilateral side bending.      Strength   Overall Strength Comments Hip strength is 4/5      Flexibility   Soft Tissue Assessment /Muscle  Length yes   Hamstrings Some tightness in bilateral HS.   Quadriceps Some tightness in bilateral quads.   ITB ITB tightness   Piriformis Some discomfort with piriformis stretch, right more than left     Palpation   Palpation comment Pt had normal lumbar vertebral movement and no pronounced muscle guarding in the lower back. Did present with twitch response in right piriformis.     Special Tests    Special Tests Sacrolliac Tests   Sacroiliac Tests  Sacral Thrust     Pelvic Dictraction   Findings Negative     Pelvic Compression   Findings Negative     Sacral thrust    Findings Negative   Side --  bilateral     Sacral Compression   Findings Negative            Objective measurements completed on examination: See above findings.                  PT Education - 04/14/17 1055    Education provided Yes   Education Details Wms flexion exercises, lumbar exension exercises, ITB stretch, and quad stretch.    Person(s) Educated Patient   Methods Explanation;Demonstration;Verbal cues;Handout   Comprehension Verbalized understanding          PT Short Term Goals - 04/14/17 1247      PT SHORT TERM GOAL #1   Title Independent with initial HEP.    Time 1   Period Weeks   Status New           PT Long Term Goals - 04/14/17 1247      PT LONG TERM GOAL #1   Title Pt will report being able to sit or lay down for 30 minutes without pain greater than 3/10.    Time 8   Period Weeks   Status New     PT LONG TERM GOAL #2   Title Pt will be able to sleep through the night without being woken up by pain.    Time 8   Period Weeks   Status New     PT LONG TERM GOAL #3   Title Pt will be independent with advanced HEP, or gym routine.    Time 8   Status New     PT LONG TERM GOAL #4   Title Pt demonstrates proper postural corrections and body mechanics.    Time 8   Period Weeks   Status New  Plan - 2017-05-12 1057    Clinical  Impression Statement Pt has pain in her buttock, low back, hips, and into her bilateral LE, usually just above her knees but sometimes below, but denies neurological symptoms. She reports that she is very active. Her pain happens during sitting or laying down to rest.    Clinical Presentation Evolving   Clinical Decision Making Low   Rehab Potential Good   PT Frequency 2x / week   PT Duration 8 weeks   PT Treatment/Interventions Therapeutic exercise;Therapeutic activities;Cryotherapy;Electrical Stimulation;Iontophoresis 59m/ml Dexamethasone;Moist Heat;Traction;Patient/family education;Manual techniques;Dry needling;Passive range of motion   PT Next Visit Plan Work on strengthening core and LE, stretching ITB, piriformis, quads, and modalities for pain if needed.    PT Home Exercise Plan Gave extension exercises, William's flexion exercises, ITB, quad stretch, and asked Pt to continue the piriformis stretch that she was already doing independently.    Consulted and Agree with Plan of Care Patient      Patient will benefit from skilled therapeutic intervention in order to improve the following deficits and impairments:  Pain, Decreased strength, Impaired flexibility, Increased muscle spasms, Decreased endurance, Decreased mobility, Postural dysfunction  Visit Diagnosis: Acute bilateral low back pain, with sciatica presence unspecified  Pain in both lower extremities      G-Codes - [id number]    Functional Assessment Tool Used (Outpatient Only) foto 43% limitation   Functional Limitation Mobility: Walking and moving around   Mobility: Walking and Moving Around Current Status [phone] At least 40 percent but less than 60 percent impaired, limited or restricted   Mobility: Walking and Moving Around Goal Status [phone] At least 20 percent but less than 40 percent impaired, limited or restricted       Problem List Patient Active Problem List   Diagnosis Date Noted  . Contact dermatitis  01/26/2017  . Neuropathy 12/07/2016  . Essential hypertension, benign 05/25/2016  . Dyslipidemia 05/25/2016  . Acute appendicitis 01/25/2016  . Rash and nonspecific skin eruption 04/22/2015  . History of colonic polyps 10/08/2014  . Near syncope 11/05/2013  . Heart murmur 11/05/2013  . Well adult exam 10/30/2012  . Internal hemorrhoids with prolapse & pain 07/12/2012  . IBS (irritable bowel syndrome) 07/12/2012  . Diverticulosis of colon with sigmoid stricture 07/12/2012  . Cough 03/07/2012  . Asthmatic bronchitis 03/07/2012  . Hip pain, chronic 12/14/2011  . Glossal erythema 12/14/2011  . Osteoarthritis 07/27/2011  . HIP PAIN 12/02/2010  . MENOPAUSAL SYNDROME 06/02/2010  . DIZZINESS 06/02/2010  . LOW BACK PAIN 05/13/2007   PHYSICAL THERAPY DISCHARGE SUMMARY   Plan: Patient agrees to discharge.  Patient goals were not met. Patient is being discharged due to not returning since the last visit.  ?????      KLennart Pall708-09-18 1:26 PM  [address] [place] [address]Phone: [phone]  Fax:  [phone] Name: Tina KARNEYMRN: [id number] of Birth: 125-Oct-1952

## 2017-05-03 NOTE — Assessment & Plan Note (Signed)
Musculature is weak with no evidence of significant neurogenic etiology.  Core conditioning recommended and will plan to refer to formal physical therapy and consideration of Pilates PT.  Also provided information regarding foundations therapy.  Follow-up in 8 weeks to ensure clinical improvement and reassess strength.  If any worsening symptoms can consider further diagnostic evaluation but this does seem to be functional in nature.

## 2017-05-16 DIAGNOSIS — M9902 Segmental and somatic dysfunction of thoracic region: Secondary | ICD-10-CM | POA: Diagnosis not present

## 2017-05-16 DIAGNOSIS — M9903 Segmental and somatic dysfunction of lumbar region: Secondary | ICD-10-CM | POA: Diagnosis not present

## 2017-05-16 DIAGNOSIS — M5134 Other intervertebral disc degeneration, thoracic region: Secondary | ICD-10-CM | POA: Diagnosis not present

## 2017-05-16 DIAGNOSIS — M5136 Other intervertebral disc degeneration, lumbar region: Secondary | ICD-10-CM | POA: Diagnosis not present

## 2017-05-16 DIAGNOSIS — M9905 Segmental and somatic dysfunction of pelvic region: Secondary | ICD-10-CM | POA: Diagnosis not present

## 2017-05-19 ENCOUNTER — Telehealth: Payer: Self-pay | Admitting: Sports Medicine

## 2017-05-19 NOTE — Telephone Encounter (Signed)
Patient wanted to cancel an appointment and a note was sent through [facility] about it. Awaiting patient's phone call back to cancel/reschedule the appointment. LVM for patient to call.

## 2017-05-23 ENCOUNTER — Ambulatory Visit: Payer: Medicare Other | Admitting: Sports Medicine

## 2017-05-31 ENCOUNTER — Other Ambulatory Visit: Payer: Self-pay | Admitting: Internal Medicine

## 2017-06-07 ENCOUNTER — Ambulatory Visit
Admission: RE | Admit: 2017-06-07 | Discharge: 2017-06-07 | Disposition: A | Discharge status: Home / Self Care | Payer: Medicare Other | Source: Ambulatory Visit | Attending: Internal Medicine | Admitting: Internal Medicine

## 2017-06-07 DIAGNOSIS — Z1231 Encounter for screening mammogram for malignant neoplasm of breast: Secondary | ICD-10-CM | POA: Diagnosis not present

## 2017-06-13 DIAGNOSIS — M71349 Other bursal cyst, unspecified hand: Secondary | ICD-10-CM | POA: Diagnosis not present

## 2017-06-13 DIAGNOSIS — L570 Actinic keratosis: Secondary | ICD-10-CM | POA: Diagnosis not present

## 2017-06-13 DIAGNOSIS — L820 Inflamed seborrheic keratosis: Secondary | ICD-10-CM | POA: Diagnosis not present

## 2017-06-22 ENCOUNTER — Ambulatory Visit: Payer: Medicare Other | Admitting: Nurse Practitioner

## 2017-06-23 ENCOUNTER — Encounter: Payer: Self-pay | Admitting: Nurse Practitioner

## 2017-06-23 ENCOUNTER — Ambulatory Visit (INDEPENDENT_AMBULATORY_CARE_PROVIDER_SITE_OTHER): Payer: Medicare Other | Admitting: Nurse Practitioner

## 2017-06-23 ENCOUNTER — Other Ambulatory Visit (INDEPENDENT_AMBULATORY_CARE_PROVIDER_SITE_OTHER): Payer: Medicare Other

## 2017-06-23 VITALS — BP 138/86 | HR 78 | Temp 98.6°F | Ht 65.0 in | Wt 156.0 lb

## 2017-06-23 DIAGNOSIS — I10 Essential (primary) hypertension: Secondary | ICD-10-CM

## 2017-06-23 DIAGNOSIS — B349 Viral infection, unspecified: Secondary | ICD-10-CM

## 2017-06-23 LAB — CBC WITH DIFFERENTIAL/PLATELET
BASOS ABS: 0 10*3/uL (ref 0.0–0.1)
Basophils Relative: 0.9 % (ref 0.0–3.0)
EOS ABS: 0.2 10*3/uL (ref 0.0–0.7)
Eosinophils Relative: 3.7 % (ref 0.0–5.0)
HEMATOCRIT: 38.5 % (ref 36.0–46.0)
HEMOGLOBIN: 13.1 g/dL (ref 12.0–15.0)
LYMPHS PCT: 29.7 % (ref 12.0–46.0)
Lymphs Abs: 1.5 10*3/uL (ref 0.7–4.0)
MCHC: 33.9 g/dL (ref 30.0–36.0)
MCV: 88.1 fl (ref 78.0–100.0)
Monocytes Absolute: 0.4 10*3/uL (ref 0.1–1.0)
Monocytes Relative: 7.2 % (ref 3.0–12.0)
Neutro Abs: 2.9 10*3/uL (ref 1.4–7.7)
Neutrophils Relative %: 58.5 % (ref 43.0–77.0)
PLATELETS: 237 10*3/uL (ref 150.0–400.0)
RBC: 4.37 Mil/uL (ref 3.87–5.11)
RDW: 12.9 % (ref 11.5–15.5)
WBC: 4.9 10*3/uL (ref 4.0–10.5)

## 2017-06-23 LAB — BASIC METABOLIC PANEL
BUN: 19 mg/dL (ref 6–23)
CALCIUM: 9.5 mg/dL (ref 8.4–10.5)
CO2: 33 mEq/L — ABNORMAL HIGH (ref 19–32)
CREATININE: 0.85 mg/dL (ref 0.40–1.20)
Chloride: 100 mEq/L (ref 96–112)
GFR: 70.74 mL/min (ref >60.00)
Glucose, Bld: 99 mg/dL (ref 70–99)
Potassium: 3.7 mEq/L (ref 3.5–5.1)
Sodium: 140 mEq/L (ref 135–145)

## 2017-06-23 MED ORDER — PROMETHAZINE HCL 12.5 MG PO TABS: 12.5000 mg | ORAL_TABLET | Freq: Three times a day (TID) | ORAL | 0 refills | Status: DC | PRN

## 2017-06-23 NOTE — Progress Notes (Signed)
Subjective:  Patient ID: Tina Bird, female    DOB: 08-Jan-1951  Age: 66 y.o. MRN: [id number]  CC: Nausea (nausea,vomit,dizzy,headache--going on for 5 days. )   Dizziness  This is a new problem. The current episode started in the past 7 days. The problem occurs constantly. The problem has been waxing and waning. Associated symptoms include abdominal pain, headaches, nausea, vertigo and vomiting. Pertinent negatives include no anorexia, arthralgias, chest pain, chills, congestion, coughing, diaphoresis, fatigue, fever, joint swelling, myalgias, neck pain, numbness, rash, sore throat, swollen glands, urinary symptoms, visual change or weakness. Associated symptoms comments: 1episode of diarrhea yesterday (loose, no blood). Nothing aggravates the symptoms. She has tried nothing for the symptoms.    Outpatient Medications Prior to Visit  Medication Sig Dispense Refill  . acetaminophen (TYLENOL) 325 MG tablet Take 2 tablets (650 mg total) by mouth every 6 (six) hours as needed for mild pain (or temp > 100).    . Cholecalciferol 1000 UNITS tablet Take 1,000 Units by mouth daily.      . clotrimazole-betamethasone (LOTRISONE) cream Apply 1 application topically 2 (two) times daily. 15 g 1  . diclofenac (VOLTAREN) 75 MG EC tablet Take 1 tablet (75 mg total) by mouth 2 (two) times daily. Take 1 tab bid X 10 days then as needed 30 tablet 0  . escitalopram (LEXAPRO) 10 MG tablet Take 1 tablet (10 mg total) by mouth daily. Annual appt due in august must see Md for future refills 30 tablet 2  . famotidine (PEPCID) 20 MG tablet Take 1 tablet (20 mg total) by mouth at bedtime. 90 tablet 3  . ibuprofen (ADVIL,MOTRIN) 200 MG tablet Take 400 mg by mouth every 6 (six) hours as needed for moderate pain.    . meclizine (ANTIVERT) 25 MG tablet Take 12.5 mg by mouth 3 (three) times daily as needed. verti    . omeprazole (PRILOSEC) 40 MG capsule take 1 capsule by mouth once daily 90 capsule 3  .  saccharomyces boulardii (FLORASTOR) 250 MG capsule You can buy this over the counter at any drug store, I would take for at least a week after your last antibiotic.    [name] triamcinolone cream (KENALOG) 0.1 % Apply 1 application topically 2 (two) times daily. 30 g 0  . triamterene-hydrochlorothiazide (MAXZIDE-25) 37.5-25 MG tablet take 1 tablet by mouth once daily 30 tablet 11  . vitamin B-12 (CYANOCOBALAMIN) 1000 MCG tablet Take 1,000 mcg by mouth daily.    [name] atorvastatin (LIPITOR) 10 MG tablet Take 1 tablet (10 mg total) by mouth daily. 30 tablet 11  . Magnesium Hydroxide (MAGNESIA PO) Take 1 tablet by mouth daily.     No facility-administered medications prior to visit.     ROS See HPI  Objective:  BP 138/86   Pulse 78   Temp 98.6 F (37 C)   Ht 5\' 5"  (1.651 m)   Wt 156 lb (70.8 kg)   SpO2 97%   BMI 25.96 kg/m   BP Readings from Last 3 Encounters:  06/23/17 138/86  04/08/17 122/84  12/07/16 (!) 150/84    Wt Readings from Last 3 Encounters:  06/23/17 156 lb (70.8 kg)  04/08/17 154 lb 6.4 oz (70 kg)  01/26/17 155 lb (70.3 kg)    Physical Exam  Constitutional: She is oriented to person, place, and time. No distress.  HENT:  Right Ear: Tympanic membrane, external ear and ear canal normal.  Left Ear: Tympanic membrane, external ear and ear canal normal.  Nose: Nose normal. No mucosal edema or rhinorrhea. Right sinus exhibits no maxillary sinus tenderness and no frontal sinus tenderness. Left sinus exhibits no maxillary sinus tenderness and no frontal sinus tenderness.  Cardiovascular: Normal rate, regular rhythm and normal heart sounds.   Pulmonary/Chest: Effort normal and breath sounds normal.  Abdominal: Soft. Bowel sounds are normal.  Musculoskeletal: Normal range of motion.  Neurological: She is alert and oriented to person, place, and time. No cranial nerve deficit. Coordination normal.  Skin: Skin is warm and dry.  Psychiatric: She has a normal mood and affect. Her  behavior is normal.  Vitals reviewed.   Lab Results  Component Value Date   WBC 4.9 06/23/2017   HGB 13.1 06/23/2017   HCT 38.5 06/23/2017   PLT 237.0 06/23/2017   GLUCOSE 99 06/23/2017   CHOL 200 05/11/2016   TRIG 164.0 (H) 05/11/2016   HDL 36.90 (L) 05/11/2016   LDLDIRECT 138.5 11/13/2012   LDLCALC 130 (H) 05/11/2016   ALT 20 12/07/2016   AST 18 12/07/2016   NA 140 06/23/2017   K 3.7 06/23/2017   CL 100 06/23/2017   CREATININE 0.85 06/23/2017   BUN 19 06/23/2017   CO2 33 (H) 06/23/2017   TSH 2.36 05/11/2016   HGBA1C 6.0 12/07/2016    Mm Screening Breast Tomo Bilateral  Result Date: 06/07/2017 CLINICAL DATA:  Screening. EXAM: 2D DIGITAL SCREENING BILATERAL MAMMOGRAM WITH CAD AND ADJUNCT TOMO COMPARISON:  Previous exam(s). ACR Breast Density Category c: The breast tissue is heterogeneously dense, which may obscure small masses. FINDINGS: There are no findings suspicious for malignancy. Images were processed with CAD. IMPRESSION: No mammographic evidence of malignancy. A result letter of this screening mammogram will be mailed directly to the patient. RECOMMENDATION: Screening mammogram in one year. (Code:SM-B-01Y) BI-RADS CATEGORY  1: Negative. Electronically Signed   By: Lovey Newcomer M.D.   On: 06/07/2017 11:46    Assessment & Plan:   Tina Bird was seen today for nausea.  Diagnoses and all orders for this visit:  Essential hypertension, benign -     Basic metabolic panel; Future  Acute viral syndrome -     Basic metabolic panel; Future -     promethazine (PHENERGAN) 12.5 MG tablet; Take 1 tablet (12.5 mg total) by mouth every 8 (eight) hours as needed for nausea or vomiting. -     CBC w/Diff; Future   I am having Tina Bird start on promethazine. I am also having her maintain her meclizine, Cholecalciferol, vitamin B-12, clotrimazole-betamethasone, ibuprofen, Magnesium Hydroxide (MAGNESIA PO), acetaminophen, saccharomyces boulardii, famotidine, atorvastatin,  omeprazole, triamcinolone cream, escitalopram, diclofenac, and triamterene-hydrochlorothiazide.  Meds ordered this encounter  Medications  . promethazine (PHENERGAN) 12.5 MG tablet    Sig: Take 1 tablet (12.5 mg total) by mouth every 8 (eight) hours as needed for nausea or vomiting.    Dispense:  20 tablet    Refill:  0    Order Specific Question:   Supervising Provider    Answer:   Cassandria Anger [[id number]]    Follow-up: Return if symptoms worsen or fail to improve.  Wilfred Lacy, NP

## 2017-06-23 NOTE — Patient Instructions (Signed)
Go to basement for blood draw.   Viral Illness, Adult Viruses are tiny germs that can get into a person's body and cause illness. There are many different types of viruses, and they cause many types of illness. Viral illnesses can range from mild to severe. They can affect various parts of the body. Common illnesses that are caused by a virus include colds and the flu. Viral illnesses also include serious conditions such as HIV/AIDS (human immunodeficiency virus/acquired immunodeficiency syndrome). A few viruses have been linked to certain cancers. What are the causes? Many types of viruses can cause illness. Viruses invade cells in your body, multiply, and cause the infected cells to malfunction or die. When the cell dies, it releases more of the virus. When this happens, you develop symptoms of the illness, and the virus continues to spread to other cells. If the virus takes over the function of the cell, it can cause the cell to divide and grow out of control, as is the case when a virus causes cancer. Different viruses get into the body in different ways. You can get a virus by:  Swallowing food or water that is contaminated with the virus.  Breathing in droplets that have been coughed or sneezed into the air by an infected person.  Touching a surface that has been contaminated with the virus and then touching your eyes, nose, or mouth.  Being bitten by an insect or animal that carries the virus.  Having sexual contact with a person who is infected with the virus.  Being exposed to blood or fluids that contain the virus, either through an open cut or during a transfusion.  If a virus enters your body, your body's defense system (immune system) will try to fight the virus. You may be at higher risk for a viral illness if your immune system is weak. What are the signs or symptoms? Symptoms vary depending on the type of virus and the location of the cells that it invades. Common symptoms of  the main types of viral illnesses include: Cold and flu viruses  Fever.  Headache.  Sore throat.  Muscle aches.  Nasal congestion.  Cough. Digestive system (gastrointestinal) viruses  Fever.  Abdominal pain.  Nausea.  Diarrhea. Liver viruses (hepatitis)  Loss of appetite.  Tiredness.  Yellowing of the skin (jaundice). Brain and spinal cord viruses  Fever.  Headache.  Stiff neck.  Nausea and vomiting.  Confusion or sleepiness. Skin viruses  Warts.  Itching.  Rash. Sexually transmitted viruses  Discharge.  Swelling.  Redness.  Rash. How is this treated? Viruses can be difficult to treat because they live within cells. Antibiotic medicines do not treat viruses because these drugs do not get inside cells. Treatment for a viral illness may include:  Resting and drinking plenty of fluids.  Medicines to relieve symptoms. These can include over-the-counter medicine for pain and fever, medicines for cough or congestion, and medicines to relieve diarrhea.  Antiviral medicines. These drugs are available only for certain types of viruses. They may help reduce flu symptoms if taken early. There are also many antiviral medicines for hepatitis and HIV/AIDS.  Some viral illnesses can be prevented with vaccinations. A common example is the flu shot. Follow these instructions at home: Medicines   Take over-the-counter and prescription medicines only as told by your health care provider.  If you were prescribed an antiviral medicine, take it as told by your health care provider. Do not stop taking the medicine even if  you start to feel better.  Be aware of when antibiotics are needed and when they are not needed. Antibiotics do not treat viruses. If your health care provider thinks that you may have a bacterial infection as well as a viral infection, you may get an antibiotic. ? Do not ask for an antibiotic prescription if you have been diagnosed with a viral  illness. That will not make your illness go away faster. ? Frequently taking antibiotics when they are not needed can lead to antibiotic resistance. When this develops, the medicine no longer works against the bacteria that it normally fights. General instructions  Drink enough fluids to keep your urine clear or pale yellow.  Rest as much as possible.  Return to your normal activities as told by your health care provider. Ask your health care provider what activities are safe for you.  Keep all follow-up visits as told by your health care provider. This is important. How is this prevented? Take these actions to reduce your risk of viral infection:  Eat a healthy diet and get enough rest.  Wash your hands often with soap and water. This is especially important when you are in public places. If soap and water are not available, use hand sanitizer.  Avoid close contact with friends and family who have a viral illness.  If you travel to areas where viral gastrointestinal infection is common, avoid drinking water or eating raw food.  Keep your immunizations up to date. Get a flu shot every year as told by your health care provider.  Do not share toothbrushes, nail clippers, razors, or needles with other people.  Always practice safe sex.  Contact a health care provider if:  You have symptoms of a viral illness that do not go away.  Your symptoms come back after going away.  Your symptoms get worse. Get help right away if:  You have trouble breathing.  You have a severe headache or a stiff neck.  You have severe vomiting or abdominal pain. This information is not intended to replace advice given to you by your health care provider. Make sure you discuss any questions you have with your health care provider. Document Released: 01/30/2016 Document Revised: 03/03/2016 Document Reviewed: 01/30/2016 Elsevier Interactive Patient Education  [employer].

## 2017-06-24 ENCOUNTER — Telehealth: Payer: Self-pay | Admitting: Nurse Practitioner

## 2017-06-24 NOTE — Telephone Encounter (Signed)
Patient has called in for lab results.  I have given her [place] response.

## 2017-07-05 ENCOUNTER — Telehealth: Payer: Self-pay | Admitting: Internal Medicine

## 2017-07-05 MED ORDER — ESCITALOPRAM OXALATE 10 MG PO TABS: 10.0000 mg | ORAL_TABLET | Freq: Every day | ORAL | 0 refills | Status: DC

## 2017-07-05 NOTE — Telephone Encounter (Signed)
Patient requesting refill on lexapro to be sent to [place] at [facility].

## 2017-07-05 NOTE — Telephone Encounter (Signed)
Sent 30 day supply until annual appt w/MD.../lmb

## 2017-07-11 ENCOUNTER — Ambulatory Visit (INDEPENDENT_AMBULATORY_CARE_PROVIDER_SITE_OTHER): Payer: Medicare Other

## 2017-07-11 DIAGNOSIS — Z23 Encounter for immunization: Secondary | ICD-10-CM

## 2017-08-05 ENCOUNTER — Other Ambulatory Visit: Payer: Self-pay | Admitting: Internal Medicine

## 2017-08-08 ENCOUNTER — Encounter: Payer: Self-pay | Admitting: Internal Medicine

## 2017-08-08 ENCOUNTER — Ambulatory Visit (INDEPENDENT_AMBULATORY_CARE_PROVIDER_SITE_OTHER): Payer: Medicare Other | Admitting: Internal Medicine

## 2017-08-08 VITALS — BP 124/78 | HR 73 | Temp 98.5°F | Ht 65.0 in | Wt 157.0 lb

## 2017-08-08 DIAGNOSIS — I10 Essential (primary) hypertension: Secondary | ICD-10-CM

## 2017-08-08 DIAGNOSIS — E785 Hyperlipidemia, unspecified: Secondary | ICD-10-CM | POA: Diagnosis not present

## 2017-08-08 DIAGNOSIS — Z0000 Encounter for general adult medical examination without abnormal findings: Secondary | ICD-10-CM | POA: Diagnosis not present

## 2017-08-08 MED ORDER — OMEPRAZOLE 40 MG PO CPDR: 40.0000 mg | DELAYED_RELEASE_CAPSULE | Freq: Every day | ORAL | 0 refills | Status: DC

## 2017-08-08 MED ORDER — ZOSTER VAC RECOMB ADJUVANTED 50 MCG/0.5ML IM SUSR: 0.5000 mL | Freq: Once | INTRAMUSCULAR | 1 refills | Status: AC

## 2017-08-08 MED ORDER — FAMOTIDINE 20 MG PO TABS: 20.0000 mg | ORAL_TABLET | Freq: Every day | ORAL | 3 refills | Status: DC

## 2017-08-08 MED ORDER — ESCITALOPRAM OXALATE 10 MG PO TABS: 10.0000 mg | ORAL_TABLET | Freq: Every day | ORAL | 2 refills | Status: DC

## 2017-08-08 MED ORDER — TRIAMTERENE-HCTZ 37.5-25 MG PO TABS: 1.0000 | ORAL_TABLET | Freq: Every day | ORAL | 3 refills | Status: DC

## 2017-08-08 NOTE — Assessment & Plan Note (Signed)
Here for medicare wellness/physical  Diet: heart healthy  Physical activity: not sedentary  Depression/mood screen: negative  Hearing: intact to whispered voice  Visual acuity: grossly normal, performs annual eye exam  ADLs: capable  Fall risk: low to none  Home safety: good  Cognitive evaluation: intact to orientation, naming, recall and repetition  EOL planning: adv directives, full code/ I agree  I have personally reviewed and have noted  1. The patient's medical, surgical and social history  2. Their use of alcohol, tobacco or illicit drugs  3. Their current medications and supplements  4. The patient's functional ability including ADL's, fall risks, home safety risks and hearing or visual impairment.  5. Diet and physical activities  6. Evidence for depression or mood disorders 7. The roster of all physicians providing medical care to patient - is listed in the Snapshot section of the chart and reviewed today.    Today patient counseled on age appropriate routine health concerns for screening and prevention, each reviewed and up to date or declined. Immunizations reviewed and up to date or declined. Labs ordered and reviewed. Risk factors for depression reviewed and negative. Hearing function and visual acuity are intact. ADLs screened and addressed as needed. Functional ability and level of safety reviewed and appropriate. Education, counseling and referrals performed based on assessed risks today. Patient provided with a copy of personalized plan for preventive services.  Colon 2011 - Dr Carlean Purl, due next 2021 mammo q 1 year PAP - advised to schedule Singrix Rx

## 2017-08-08 NOTE — Assessment & Plan Note (Signed)
Maxzide

## 2017-08-08 NOTE — Assessment & Plan Note (Signed)
On diet Not taking Lipitor

## 2017-08-08 NOTE — Patient Instructions (Signed)
Health Maintenance for Postmenopausal Women Menopause is a normal process in which your reproductive ability comes to an end. This process happens gradually over a span of months to years, usually between the ages of 22 and 9. Menopause is complete when you have missed 12 consecutive menstrual periods. It is important to talk with your health care provider about some of the most common conditions that affect postmenopausal women, such as heart disease, cancer, and bone loss (osteoporosis). Adopting a healthy lifestyle and getting preventive care can help to promote your health and wellness. Those actions can also lower your chances of developing some of these common conditions. What should I know about menopause? During menopause, you may experience a number of symptoms, such as:  Moderate-to-severe hot flashes.  Night sweats.  Decrease in sex drive.  Mood swings.  Headaches.  Tiredness.  Irritability.  Memory problems.  Insomnia.  Choosing to treat or not to treat menopausal changes is an individual decision that you make with your health care provider. What should I know about hormone replacement therapy and supplements? Hormone therapy products are effective for treating symptoms that are associated with menopause, such as hot flashes and night sweats. Hormone replacement carries certain risks, especially as you become older. If you are thinking about using estrogen or estrogen with progestin treatments, discuss the benefits and risks with your health care provider. What should I know about heart disease and stroke? Heart disease, heart attack, and stroke become more likely as you age. This may be due, in part, to the hormonal changes that your body experiences during menopause. These can affect how your body processes dietary fats, triglycerides, and cholesterol. Heart attack and stroke are both medical emergencies. There are many things that you can do to help prevent heart disease  and stroke:  Have your blood pressure checked at least every 1-2 years. High blood pressure causes heart disease and increases the risk of stroke.  If you are 53-22 years old, ask your health care provider if you should take aspirin to prevent a heart attack or a stroke.  Do not use any tobacco products, including cigarettes, chewing tobacco, or electronic cigarettes. If you need help quitting, ask your health care provider.  It is important to eat a healthy diet and maintain a healthy weight. ? Be sure to include plenty of vegetables, fruits, low-fat dairy products, and lean protein. ? Avoid eating foods that are high in solid fats, added sugars, or salt (sodium).  Get regular exercise. This is one of the most important things that you can do for your health. ? Try to exercise for at least 150 minutes each week. The type of exercise that you do should increase your heart rate and make you sweat. This is known as moderate-intensity exercise. ? Try to do strengthening exercises at least twice each week. Do these in addition to the moderate-intensity exercise.  Know your numbers.Ask your health care provider to check your cholesterol and your blood glucose. Continue to have your blood tested as directed by your health care provider.  What should I know about cancer screening? There are several types of cancer. Take the following steps to reduce your risk and to catch any cancer development as early as possible. Breast Cancer  Practice breast self-awareness. ? This means understanding how your breasts normally appear and feel. ? It also means doing regular breast self-exams. Let your health care provider know about any changes, no matter how small.  If you are 40  or older, have a clinician do a breast exam (clinical breast exam or CBE) every year. Depending on your age, family history, and medical history, it may be recommended that you also have a yearly breast X-ray (mammogram).  If you  have a family history of breast cancer, talk with your health care provider about genetic screening.  If you are at high risk for breast cancer, talk with your health care provider about having an MRI and a mammogram every year.  Breast cancer (BRCA) gene test is recommended for women who have family members with BRCA-related cancers. Results of the assessment will determine the need for genetic counseling and BRCA1 and for BRCA2 testing. BRCA-related cancers include these types: ? Breast. This occurs in males or females. ? Ovarian. ? Tubal. This may also be called fallopian tube cancer. ? Cancer of the abdominal or pelvic lining (peritoneal cancer). ? Prostate. ? Pancreatic.  Cervical, Uterine, and Ovarian Cancer Your health care provider may recommend that you be screened regularly for cancer of the pelvic organs. These include your ovaries, uterus, and vagina. This screening involves a pelvic exam, which includes checking for microscopic changes to the surface of your cervix (Pap test).  For women ages 21-65, health care providers may recommend a pelvic exam and a Pap test every three years. For women ages 79-65, they may recommend the Pap test and pelvic exam, combined with testing for human papilloma virus (HPV), every five years. Some types of HPV increase your risk of cervical cancer. Testing for HPV may also be done on women of any age who have unclear Pap test results.  Other health care providers may not recommend any screening for nonpregnant women who are considered low risk for pelvic cancer and have no symptoms. Ask your health care provider if a screening pelvic exam is right for you.  If you have had past treatment for cervical cancer or a condition that could lead to cancer, you need Pap tests and screening for cancer for at least 20 years after your treatment. If Pap tests have been discontinued for you, your risk factors (such as having a new sexual partner) need to be  reassessed to determine if you should start having screenings again. Some women have medical problems that increase the chance of getting cervical cancer. In these cases, your health care provider may recommend that you have screening and Pap tests more often.  If you have a family history of uterine cancer or ovarian cancer, talk with your health care provider about genetic screening.  If you have vaginal bleeding after reaching menopause, tell your health care provider.  There are currently no reliable tests available to screen for ovarian cancer.  Lung Cancer Lung cancer screening is recommended for adults 69-62 years old who are at high risk for lung cancer because of a history of smoking. A yearly low-dose CT scan of the lungs is recommended if you:  Currently smoke.  Have a history of at least 30 pack-years of smoking and you currently smoke or have quit within the past 15 years. A pack-year is smoking an average of one pack of cigarettes per day for one year.  Yearly screening should:  Continue until it has been 15 years since you quit.  Stop if you develop a health problem that would prevent you from having lung cancer treatment.  Colorectal Cancer  This type of cancer can be detected and can often be prevented.  Routine colorectal cancer screening usually begins at  age 42 and continues through age 45.  If you have risk factors for colon cancer, your health care provider may recommend that you be screened at an earlier age.  If you have a family history of colorectal cancer, talk with your health care provider about genetic screening.  Your health care provider may also recommend using home test kits to check for hidden blood in your stool.  A small camera at the end of a tube can be used to examine your colon directly (sigmoidoscopy or colonoscopy). This is done to check for the earliest forms of colorectal cancer.  Direct examination of the colon should be repeated every  5-10 years until age 71. However, if early forms of precancerous polyps or small growths are found or if you have a family history or genetic risk for colorectal cancer, you may need to be screened more often.  Skin Cancer  Check your skin from head to toe regularly.  Monitor any moles. Be sure to tell your health care provider: ? About any new moles or changes in moles, especially if there is a change in a mole's shape or color. ? If you have a mole that is larger than the size of a pencil eraser.  If any of your family members has a history of skin cancer, especially at a young age, talk with your health care provider about genetic screening.  Always use sunscreen. Apply sunscreen liberally and repeatedly throughout the day.  Whenever you are outside, protect yourself by wearing long sleeves, pants, a wide-brimmed hat, and sunglasses.  What should I know about osteoporosis? Osteoporosis is a condition in which bone destruction happens more quickly than new bone creation. After menopause, you may be at an increased risk for osteoporosis. To help prevent osteoporosis or the bone fractures that can happen because of osteoporosis, the following is recommended:  If you are 46-71 years old, get at least 1,000 mg of calcium and at least 600 mg of vitamin D per day.  If you are older than age 55 but younger than age 65, get at least 1,200 mg of calcium and at least 600 mg of vitamin D per day.  If you are older than age 54, get at least 1,200 mg of calcium and at least 800 mg of vitamin D per day.  Smoking and excessive alcohol intake increase the risk of osteoporosis. Eat foods that are rich in calcium and vitamin D, and do weight-bearing exercises several times each week as directed by your health care provider. What should I know about how menopause affects my mental health? Depression may occur at any age, but it is more common as you become older. Common symptoms of depression  include:  Low or sad mood.  Changes in sleep patterns.  Changes in appetite or eating patterns.  Feeling an overall lack of motivation or enjoyment of activities that you previously enjoyed.  Frequent crying spells.  Talk with your health care provider if you think that you are experiencing depression. What should I know about immunizations? It is important that you get and maintain your immunizations. These include:  Tetanus, diphtheria, and pertussis (Tdap) booster vaccine.  Influenza every year before the flu season begins.  Pneumonia vaccine.  Shingles vaccine.  Your health care provider may also recommend other immunizations. This information is not intended to replace advice given to you by your health care provider. Make sure you discuss any questions you have with your health care provider. Document Released: 11/12/2005  Document Revised: 04/09/2016 Document Reviewed: 06/24/2015 Elsevier Interactive Patient Education  2018 [employer]  

## 2017-08-08 NOTE — Progress Notes (Signed)
Subjective:  Patient ID: Tina Bird, female    DOB: Mar 13, 1951  Age: 66 y.o. MRN: [id number]  CC: No chief complaint on file.   HPI [place] [facility] [facility]-Wright presents for a well exam F/u GERD, anxiety  Outpatient Medications Prior to Visit  Medication Sig Dispense Refill  . Cholecalciferol 1000 UNITS tablet Take 1,000 Units by mouth daily.      [name] escitalopram (LEXAPRO) 10 MG tablet Take 1 tablet (10 mg total) by mouth daily. Annual appt was due in august must see Md for future refills 30 tablet 0  . famotidine (PEPCID) 20 MG tablet Take 1 tablet (20 mg total) by mouth at bedtime. 90 tablet 3  . meclizine (ANTIVERT) 25 MG tablet Take 12.5 mg by mouth 3 (three) times daily as needed. verti    . omeprazole (PRILOSEC) 40 MG capsule take 1 capsule by mouth once daily 90 capsule 0  . triamterene-hydrochlorothiazide (MAXZIDE-25) 37.5-25 MG tablet take 1 tablet by mouth once daily 30 tablet 11  . vitamin B-12 (CYANOCOBALAMIN) 1000 MCG tablet Take 1,000 mcg by mouth daily.    [name] acetaminophen (TYLENOL) 325 MG tablet Take 2 tablets (650 mg total) by mouth every 6 (six) hours as needed for mild pain (or temp > 100).    [name] atorvastatin (LIPITOR) 10 MG tablet Take 1 tablet (10 mg total) by mouth daily. 30 tablet 11  . clotrimazole-betamethasone (LOTRISONE) cream Apply 1 application topically 2 (two) times daily. 15 g 1  . diclofenac (VOLTAREN) 75 MG EC tablet Take 1 tablet (75 mg total) by mouth 2 (two) times daily. Take 1 tab bid X 10 days then as needed 30 tablet 0  . ibuprofen (ADVIL,MOTRIN) 200 MG tablet Take 400 mg by mouth every 6 (six) hours as needed for moderate pain.    . Magnesium Hydroxide (MAGNESIA PO) Take 1 tablet by mouth daily.    . promethazine (PHENERGAN) 12.5 MG tablet Take 1 tablet (12.5 mg total) by mouth every 8 (eight) hours as needed for nausea or vomiting. 20 tablet 0  . saccharomyces boulardii (FLORASTOR) 250 MG capsule You can buy this over the counter at any drug  store, I would take for at least a week after your last antibiotic.    [name] triamcinolone cream (KENALOG) 0.1 % Apply 1 application topically 2 (two) times daily. 30 g 0   No facility-administered medications prior to visit.     ROS Review of Systems  Constitutional: Negative for activity change, appetite change, chills, fatigue and unexpected weight change.  HENT: Negative for congestion, mouth sores and sinus pressure.   Eyes: Negative for visual disturbance.  Respiratory: Negative for cough and chest tightness.   Gastrointestinal: Negative for abdominal pain and nausea.  Genitourinary: Negative for difficulty urinating, frequency and vaginal pain.  Musculoskeletal: Negative for back pain and gait problem.  Skin: Negative for pallor and rash.  Neurological: Negative for dizziness, tremors, weakness, numbness and headaches.  Psychiatric/Behavioral: Negative for confusion, sleep disturbance and suicidal ideas.    Objective:  BP 124/78 (BP Location: Left Arm, Patient Position: Sitting, Cuff Size: Large)   Pulse 73   Temp 98.5 F (36.9 C) (Oral)   Ht 5\' 5"  (1.651 m)   Wt 157 lb (71.2 kg)   SpO2 98%   BMI 26.13 kg/m   BP Readings from Last 3 Encounters:  08/08/17 124/78  06/23/17 138/86  04/08/17 122/84    Wt Readings from Last 3 Encounters:  08/08/17 157 lb (71.2 kg)  06/23/17 156 lb (70.8 kg)  04/08/17 154 lb 6.4 oz (70 kg)    Physical Exam  Constitutional: She appears well-developed. No distress.  HENT:  Head: Normocephalic.  Right Ear: External ear normal.  Left Ear: External ear normal.  Nose: Nose normal.  Mouth/Throat: Oropharynx is clear and moist.  Eyes: Conjunctivae are normal. Pupils are equal, round, and reactive to light. Right eye exhibits no discharge. Left eye exhibits no discharge.  Neck: Normal range of motion. Neck supple. No JVD present. No tracheal deviation present. No thyromegaly present.  Cardiovascular: Normal rate, regular rhythm and normal  heart sounds.  Pulmonary/Chest: No stridor. No respiratory distress. She has no wheezes.  Abdominal: Soft. Bowel sounds are normal. She exhibits no distension and no mass. There is no tenderness. There is no rebound and no guarding.  Musculoskeletal: She exhibits no edema or tenderness.  Lymphadenopathy:    She has no cervical adenopathy.  Neurological: She displays normal reflexes. No cranial nerve deficit. She exhibits normal muscle tone. Coordination normal.  Skin: No rash noted. No erythema.  Psychiatric: She has a normal mood and affect. Her behavior is normal. Judgment and thought content normal.    Lab Results  Component Value Date   WBC 4.9 06/23/2017   HGB 13.1 06/23/2017   HCT 38.5 06/23/2017   PLT 237.0 06/23/2017   GLUCOSE 99 06/23/2017   CHOL 200 05/11/2016   TRIG 164.0 (H) 05/11/2016   HDL 36.90 (L) 05/11/2016   LDLDIRECT 138.5 11/13/2012   LDLCALC 130 (H) 05/11/2016   ALT 20 12/07/2016   AST 18 12/07/2016   NA 140 06/23/2017   K 3.7 06/23/2017   CL 100 06/23/2017   CREATININE 0.85 06/23/2017   BUN 19 06/23/2017   CO2 33 (H) 06/23/2017   TSH 2.36 05/11/2016   HGBA1C 6.0 12/07/2016    Mm Screening Breast Tomo Bilateral  Result Date: 06/07/2017 CLINICAL DATA:  Screening. EXAM: 2D DIGITAL SCREENING BILATERAL MAMMOGRAM WITH CAD AND ADJUNCT TOMO COMPARISON:  Previous exam(s). ACR Breast Density Category c: The breast tissue is heterogeneously dense, which may obscure small masses. FINDINGS: There are no findings suspicious for malignancy. Images were processed with CAD. IMPRESSION: No mammographic evidence of malignancy. A result letter of this screening mammogram will be mailed directly to the patient. RECOMMENDATION: Screening mammogram in one year. (Code:SM-B-01Y) BI-RADS CATEGORY  1: Negative. Electronically Signed   By: Lovey Newcomer M.D.   On: 06/07/2017 11:46    Assessment & Plan:   There are no diagnoses linked to this encounter. I have discontinued Anaya Bovee.  Shore-Wright's clotrimazole-betamethasone, ibuprofen, Magnesium Hydroxide (MAGNESIA PO), acetaminophen, saccharomyces boulardii, atorvastatin, triamcinolone cream, diclofenac, and promethazine. I am also having her maintain her meclizine, Cholecalciferol, vitamin B-12, famotidine, triamterene-hydrochlorothiazide, escitalopram, and omeprazole.  No orders of the defined types were placed in this encounter.    Follow-up: No Follow-up on file.  Walker Kehr, MD

## 2017-08-09 DIAGNOSIS — M9905 Segmental and somatic dysfunction of pelvic region: Secondary | ICD-10-CM | POA: Diagnosis not present

## 2017-08-09 DIAGNOSIS — M9902 Segmental and somatic dysfunction of thoracic region: Secondary | ICD-10-CM | POA: Diagnosis not present

## 2017-08-09 DIAGNOSIS — M5136 Other intervertebral disc degeneration, lumbar region: Secondary | ICD-10-CM | POA: Diagnosis not present

## 2017-08-09 DIAGNOSIS — M5134 Other intervertebral disc degeneration, thoracic region: Secondary | ICD-10-CM | POA: Diagnosis not present

## 2017-08-09 DIAGNOSIS — M9903 Segmental and somatic dysfunction of lumbar region: Secondary | ICD-10-CM | POA: Diagnosis not present

## 2017-08-15 ENCOUNTER — Other Ambulatory Visit (INDEPENDENT_AMBULATORY_CARE_PROVIDER_SITE_OTHER): Payer: Medicare Other

## 2017-08-15 DIAGNOSIS — E785 Hyperlipidemia, unspecified: Secondary | ICD-10-CM | POA: Diagnosis not present

## 2017-08-15 DIAGNOSIS — I10 Essential (primary) hypertension: Secondary | ICD-10-CM | POA: Diagnosis not present

## 2017-08-15 DIAGNOSIS — Z0000 Encounter for general adult medical examination without abnormal findings: Secondary | ICD-10-CM

## 2017-08-15 LAB — URINALYSIS, ROUTINE W REFLEX MICROSCOPIC
BILIRUBIN URINE: NEGATIVE
HGB URINE DIPSTICK: NEGATIVE
KETONES UR: NEGATIVE
NITRITE: NEGATIVE
PH: 6 (ref 5.0–8.0)
RBC / HPF: NONE SEEN (ref 0–?)
Specific Gravity, Urine: 1.025 (ref 1.000–1.030)
TOTAL PROTEIN, URINE-UPE24: NEGATIVE
URINE GLUCOSE: NEGATIVE
Urobilinogen, UA: 0.2 (ref 0.0–1.0)

## 2017-08-15 LAB — LIPID PANEL
CHOL/HDL RATIO: 6
Cholesterol: 205 mg/dL — ABNORMAL HIGH (ref 0–200)
HDL: 35.4 mg/dL — ABNORMAL LOW (ref >39.00)
LDL CALC: 132 mg/dL — AB (ref 0–99)
NONHDL: 169.29
TRIGLYCERIDES: 188 mg/dL — AB (ref 0.0–149.0)
VLDL: 37.6 mg/dL (ref 0.0–40.0)

## 2017-08-15 LAB — CBC WITH DIFFERENTIAL/PLATELET
BASOS ABS: 0 10*3/uL (ref 0.0–0.1)
Basophils Relative: 0.9 % (ref 0.0–3.0)
EOS ABS: 0.2 10*3/uL (ref 0.0–0.7)
Eosinophils Relative: 4.9 % (ref 0.0–5.0)
HEMATOCRIT: 39.9 % (ref 36.0–46.0)
Hemoglobin: 13.2 g/dL (ref 12.0–15.0)
LYMPHS PCT: 32 % (ref 12.0–46.0)
Lymphs Abs: 1.6 10*3/uL (ref 0.7–4.0)
MCHC: 33.1 g/dL (ref 30.0–36.0)
MCV: 89 fl (ref 78.0–100.0)
MONOS PCT: 6.3 % (ref 3.0–12.0)
Monocytes Absolute: 0.3 10*3/uL (ref 0.1–1.0)
NEUTROS ABS: 2.8 10*3/uL (ref 1.4–7.7)
NEUTROS PCT: 55.9 % (ref 43.0–77.0)
PLATELETS: 234 10*3/uL (ref 150.0–400.0)
RBC: 4.48 Mil/uL (ref 3.87–5.11)
RDW: 13 % (ref 11.5–15.5)
WBC: 5 10*3/uL (ref 4.0–10.5)

## 2017-08-15 LAB — BASIC METABOLIC PANEL
BUN: 18 mg/dL (ref 6–23)
CHLORIDE: 99 meq/L (ref 96–112)
CO2: 33 mEq/L — ABNORMAL HIGH (ref 19–32)
CREATININE: 0.93 mg/dL (ref 0.40–1.20)
Calcium: 9.4 mg/dL (ref 8.4–10.5)
GFR: 63.74 mL/min (ref >60.00)
GLUCOSE: 106 mg/dL — AB (ref 70–99)
POTASSIUM: 3.8 meq/L (ref 3.5–5.1)
Sodium: 139 mEq/L (ref 135–145)

## 2017-08-15 LAB — HEPATIC FUNCTION PANEL
ALK PHOS: 73 U/L (ref 39–117)
ALT: 17 U/L (ref 0–35)
AST: 18 U/L (ref 0–37)
Albumin: 4.2 g/dL (ref 3.5–5.2)
BILIRUBIN DIRECT: 0.1 mg/dL (ref 0.0–0.3)
BILIRUBIN TOTAL: 0.4 mg/dL (ref 0.2–1.2)
Total Protein: 7.2 g/dL (ref 6.0–8.3)

## 2017-08-15 LAB — TSH: TSH: 2.55 u[IU]/mL (ref 0.35–4.50)

## 2017-08-17 MED ORDER — CEFUROXIME AXETIL 250 MG PO TABS: 250.0000 mg | ORAL_TABLET | Freq: Two times a day (BID) | ORAL | 0 refills | Status: AC

## 2017-08-30 ENCOUNTER — Ambulatory Visit (INDEPENDENT_AMBULATORY_CARE_PROVIDER_SITE_OTHER): Payer: Medicare Other | Admitting: Internal Medicine

## 2017-08-30 ENCOUNTER — Encounter: Payer: Self-pay | Admitting: Internal Medicine

## 2017-08-30 VITALS — BP 116/72 | HR 72 | Temp 98.3°F | Resp 16 | Wt 157.0 lb

## 2017-08-30 DIAGNOSIS — J029 Acute pharyngitis, unspecified: Secondary | ICD-10-CM

## 2017-08-30 LAB — POCT RAPID STREP A (OFFICE): RAPID STREP A SCREEN: NEGATIVE

## 2017-08-30 NOTE — Progress Notes (Signed)
Subjective:    Patient ID: Tina Bird, female    DOB: [birth year], 66 y.o.   MRN: [id number]  HPI She is here for an acute visit for cold symptoms.   Her symptoms started 3 days ago.  She has been around her grandchildren.    She is experiencing sore throat, subjective fevers, headaches and dizziness. She has tenderness on the right side of her neck.  Her sore throat has not improved.    She has tried taking tylenol and has been gargling.    Medications and allergies reviewed with patient and updated if appropriate.  Patient Active Problem List   Diagnosis Date Noted  . Contact dermatitis 01/26/2017  . Neuropathy 12/07/2016  . Essential hypertension, benign 05/25/2016  . Dyslipidemia 05/25/2016  . Acute appendicitis 01/25/2016  . Rash and nonspecific skin eruption 04/22/2015  . History of colonic polyps 10/08/2014  . Near syncope 11/05/2013  . Heart murmur 11/05/2013  . Well adult exam 10/30/2012  . Internal hemorrhoids with prolapse & pain 07/12/2012  . IBS (irritable bowel syndrome) 07/12/2012  . Diverticulosis of colon with sigmoid stricture 07/12/2012  . Cough 03/07/2012  . Asthmatic bronchitis 03/07/2012  . Hip pain, chronic 12/14/2011  . Glossal erythema 12/14/2011  . Osteoarthritis 07/27/2011  . HIP PAIN 12/02/2010  . MENOPAUSAL SYNDROME 06/02/2010  . DIZZINESS 06/02/2010  . LOW BACK PAIN 05/13/2007    Current Outpatient Medications on File Prior to Visit  Medication Sig Dispense Refill  . Cholecalciferol 1000 UNITS tablet Take 1,000 Units by mouth daily.      [name] escitalopram (LEXAPRO) 10 MG tablet Take 1 tablet (10 mg total) daily by mouth. 90 tablet 2  . famotidine (PEPCID) 20 MG tablet Take 1 tablet (20 mg total) at bedtime by mouth. 90 tablet 3  . meclizine (ANTIVERT) 25 MG tablet Take 12.5 mg by mouth 3 (three) times daily as needed. verti    . omeprazole (PRILOSEC) 40 MG capsule Take 1 capsule (40 mg total) daily by mouth. 90 capsule 0  .  triamterene-hydrochlorothiazide (MAXZIDE-25) 37.5-25 MG tablet Take 1 tablet daily by mouth. 90 tablet 3  . vitamin B-12 (CYANOCOBALAMIN) 1000 MCG tablet Take 1,000 mcg by mouth daily.     No current facility-administered medications on file prior to visit.     Past Medical History:  Diagnosis Date  . Asthmatic bronchitis 09-08-12   03-07-12 bronchits x1  . Essential hypertension, benign 05/25/2016  . GERD (gastroesophageal reflux disease)   . LBP (low back pain)   . Melanoma ([place])    Right leg and Right breast  . Menopausal symptoms    Dr. Ree Edman  . Osteoarthritis 07/27/2011   left knee  . Vertigo     Past Surgical History:  Procedure Laterality Date  . CHOLECYSTECTOMY     laparoscopic  . DILATION AND CURETTAGE OF UTERUS    . LAPAROSCOPIC APPENDECTOMY N/A 01/25/2016   Procedure: APPENDECTOMY LAPAROSCOPIC;  Surgeon: Stark Klein, MD;  Location: [facility];  Service: General;  Laterality: N/A;  . PEXY  09/11/2012   Procedure: PEXY;  Surgeon: Adin Hector, MD;  Location: [facility];  Service: General;  Laterality: N/A;  . TONSILLECTOMY    . TRANSANAL HEMORRHOIDAL DEARTERIALIZATION  09/11/2012   Procedure: TRANSANAL HEMORRHOIDAL DEARTERIALIZATION;  Surgeon: Adin Hector, MD;  Location: [facility];  Service: General;  Laterality: N/A;  [place] hemorrhoidal ligation/pexy  . TUBAL LIGATION      Social History   Socioeconomic History  .  Marital status: Married    Spouse name: None  . Number of children: None  . Years of education: None  . Highest education level: None  Social Needs  . Financial resource strain: None  . Food insecurity - worry: None  . Food insecurity - inability: None  . Transportation needs - medical: None  . Transportation needs - non-medical: None  Occupational History  . None  Tobacco Use  . Smoking status: Former Smoker    Packs/day: 0.50    Years: 30.00    Pack years: 15.00    Types: Cigarettes    Last attempt to quit: 10/04/2001    Years since quitting: 15.9    . Smokeless tobacco: Never Used  Substance and Sexual Activity  . Alcohol use: Yes    Alcohol/week: 0.6 oz    Types: 1 Glasses of wine per week    Comment: rare wine  . Drug use: No  . Sexual activity: Yes  Other Topics Concern  . None  Social History Narrative   Regular Exercise- no    Family History  Problem Relation Age of Onset  . Heart disease Brother   . Diabetes Mother   . Hypertension Mother   . Stroke Sister   . Hypertension Other   . Alzheimer's disease Other     Review of Systems  Constitutional: Positive for chills. Negative for fever (subjective ).  HENT: Positive for sore throat. Negative for congestion, ear pain, sinus pressure, sinus pain and trouble swallowing.   Respiratory: Negative for cough, shortness of breath and wheezing.   Musculoskeletal: Positive for myalgias (resolved).  Neurological: Positive for dizziness and headaches. Negative for light-headedness.       Objective:   Vitals:   08/30/17 1001  BP: 116/72  Pulse: 72  Resp: 16  Temp: 98.3 F (36.8 C)  SpO2: 96%   Filed Weights   08/30/17 1001  Weight: 157 lb (71.2 kg)   Body mass index is 26.13 kg/m.  Wt Readings from Last 3 Encounters:  08/30/17 157 lb (71.2 kg)  08/08/17 157 lb (71.2 kg)  06/23/17 156 lb (70.8 kg)     Physical Exam GENERAL APPEARANCE: Appears stated age, well appearing, NAD EYES: conjunctiva clear, no icterus HEENT: bilateral tympanic membranes and ear canals normal, oropharynx with mild erythema, no thyromegaly, trachea midline, no cervical or supraclavicular lymphadenopathy LUNGS: Clear to auscultation without wheeze or crackles, unlabored breathing, good air entry bilaterally HEART: Normal S1,S2 without murmurs EXTREMITIES: Without clubbing, cyanosis, or edema        Assessment & Plan:   See Problem List for Assessment and Plan of chronic medical problems.

## 2017-08-30 NOTE — Patient Instructions (Addendum)
Your strep test was negative.  Your sore throat is likely viral in nature.   Take tylenol or advil for the pain.  Drink plenty of fluids.  Call if no improvement    Pharyngitis Pharyngitis is redness, pain, and swelling (inflammation) of your pharynx. What are the causes? Pharyngitis is usually caused by infection. Most of the time, these infections are from viruses (viral) and are part of a cold. However, sometimes pharyngitis is caused by bacteria (bacterial). Pharyngitis can also be caused by allergies. Viral pharyngitis may be spread from person to person by coughing, sneezing, and personal items or utensils (cups, forks, spoons, toothbrushes). Bacterial pharyngitis may be spread from person to person by more intimate contact, such as kissing. What are the signs or symptoms? Symptoms of pharyngitis include:  Sore throat.  Tiredness (fatigue).  Low-grade fever.  Headache.  Joint pain and muscle aches.  Skin rashes.  Swollen lymph nodes.  Plaque-like film on throat or tonsils (often seen with bacterial pharyngitis).  How is this diagnosed? Your health care provider will ask you questions about your illness and your symptoms. Your medical history, along with a physical exam, is often all that is needed to diagnose pharyngitis. Sometimes, a rapid strep test is done. Other lab tests may also be done, depending on the suspected cause. How is this treated? Viral pharyngitis will usually get better in 3-4 days without the use of medicine. Bacterial pharyngitis is treated with medicines that kill germs (antibiotics). Follow these instructions at home:  Drink enough water and fluids to keep your urine clear or pale yellow.  Only take over-the-counter or prescription medicines as directed by your health care provider: ? If you are prescribed antibiotics, make sure you finish them even if you start to feel better. ? Do not take aspirin.  Get lots of rest.  Gargle with 8 oz of salt  water ( tsp of salt per 1 qt of water) as often as every 1-2 hours to soothe your throat.  Throat lozenges (if you are not at risk for choking) or sprays may be used to soothe your throat. Contact a health care provider if:  You have large, tender lumps in your neck.  You have a rash.  You cough up green, yellow-brown, or bloody spit. Get help right away if:  Your neck becomes stiff.  You drool or are unable to swallow liquids.  You vomit or are unable to keep medicines or liquids down.  You have severe pain that does not go away with the use of recommended medicines.  You have trouble breathing (not caused by a stuffy nose). This information is not intended to replace advice given to you by your health care provider. Make sure you discuss any questions you have with your health care provider. Document Released: 09/20/2005 Document Revised: 02/26/2016 Document Reviewed: 05/28/2013 Elsevier Interactive Patient Education  2017 [employer].

## 2017-08-30 NOTE — Assessment & Plan Note (Signed)
Rapid strep negative Likely viral in nature Advised symptomatic treatment-Tylenol/ibuprofen, rest, fluids Over-the-counter cold medications as needed Call if no improvement

## 2018-01-16 DIAGNOSIS — M5134 Other intervertebral disc degeneration, thoracic region: Secondary | ICD-10-CM | POA: Diagnosis not present

## 2018-01-16 DIAGNOSIS — M9905 Segmental and somatic dysfunction of pelvic region: Secondary | ICD-10-CM | POA: Diagnosis not present

## 2018-01-16 DIAGNOSIS — M5136 Other intervertebral disc degeneration, lumbar region: Secondary | ICD-10-CM | POA: Diagnosis not present

## 2018-01-16 DIAGNOSIS — M9902 Segmental and somatic dysfunction of thoracic region: Secondary | ICD-10-CM | POA: Diagnosis not present

## 2018-01-16 DIAGNOSIS — M9903 Segmental and somatic dysfunction of lumbar region: Secondary | ICD-10-CM | POA: Diagnosis not present

## 2018-01-18 DIAGNOSIS — M9902 Segmental and somatic dysfunction of thoracic region: Secondary | ICD-10-CM | POA: Diagnosis not present

## 2018-01-18 DIAGNOSIS — M9905 Segmental and somatic dysfunction of pelvic region: Secondary | ICD-10-CM | POA: Diagnosis not present

## 2018-01-18 DIAGNOSIS — M5134 Other intervertebral disc degeneration, thoracic region: Secondary | ICD-10-CM | POA: Diagnosis not present

## 2018-01-18 DIAGNOSIS — M5136 Other intervertebral disc degeneration, lumbar region: Secondary | ICD-10-CM | POA: Diagnosis not present

## 2018-01-18 DIAGNOSIS — M9903 Segmental and somatic dysfunction of lumbar region: Secondary | ICD-10-CM | POA: Diagnosis not present

## 2018-01-24 DIAGNOSIS — M9905 Segmental and somatic dysfunction of pelvic region: Secondary | ICD-10-CM | POA: Diagnosis not present

## 2018-01-24 DIAGNOSIS — M9902 Segmental and somatic dysfunction of thoracic region: Secondary | ICD-10-CM | POA: Diagnosis not present

## 2018-01-24 DIAGNOSIS — M5136 Other intervertebral disc degeneration, lumbar region: Secondary | ICD-10-CM | POA: Diagnosis not present

## 2018-01-24 DIAGNOSIS — M9903 Segmental and somatic dysfunction of lumbar region: Secondary | ICD-10-CM | POA: Diagnosis not present

## 2018-01-24 DIAGNOSIS — M5134 Other intervertebral disc degeneration, thoracic region: Secondary | ICD-10-CM | POA: Diagnosis not present

## 2018-01-25 ENCOUNTER — Encounter: Payer: Self-pay | Admitting: Internal Medicine

## 2018-01-25 ENCOUNTER — Ambulatory Visit (INDEPENDENT_AMBULATORY_CARE_PROVIDER_SITE_OTHER): Payer: Medicare Other | Admitting: Internal Medicine

## 2018-01-25 DIAGNOSIS — M5430 Sciatica, unspecified side: Secondary | ICD-10-CM | POA: Insufficient documentation

## 2018-01-25 MED ORDER — PREDNISONE 10 MG PO TABS: ORAL_TABLET | ORAL | 1 refills | Status: DC

## 2018-01-25 NOTE — Progress Notes (Signed)
Subjective:  Patient ID: Tina Bird, female    DOB: November 06, 1950  Age: 67 y.o. MRN: [id number]  CC: No chief complaint on file.   HPI Tina Bird presents for L leg pain. She saw a Restaurant manager, fast food. Worse w/sitting an laying down. Better w/standing and walking. Pain iwas 10/10 on Monday, 5/10 now. Took Ibuprofen.  Outpatient Medications Prior to Visit  Medication Sig Dispense Refill  . Cholecalciferol 1000 UNITS tablet Take 1,000 Units by mouth daily.      [name] escitalopram (LEXAPRO) 10 MG tablet Take 1 tablet (10 mg total) daily by mouth. 90 tablet 2  . famotidine (PEPCID) 20 MG tablet Take 1 tablet (20 mg total) at bedtime by mouth. 90 tablet 3  . meclizine (ANTIVERT) 25 MG tablet Take 12.5 mg by mouth 3 (three) times daily as needed. verti    . omeprazole (PRILOSEC) 40 MG capsule Take 1 capsule (40 mg total) daily by mouth. 90 capsule 0  . triamterene-hydrochlorothiazide (MAXZIDE-25) 37.5-25 MG tablet Take 1 tablet daily by mouth. 90 tablet 3  . vitamin B-12 (CYANOCOBALAMIN) 1000 MCG tablet Take 1,000 mcg by mouth daily.     No facility-administered medications prior to visit.     ROS Review of Systems  Constitutional: Negative for activity change, appetite change, chills, fatigue and unexpected weight change.  HENT: Negative for congestion, mouth sores and sinus pressure.   Eyes: Negative for visual disturbance.  Respiratory: Negative for cough and chest tightness.   Gastrointestinal: Negative for abdominal pain and nausea.  Genitourinary: Negative for difficulty urinating, frequency and vaginal pain.  Musculoskeletal: Positive for arthralgias and gait problem. Negative for back pain.  Skin: Negative for pallor and rash.  Neurological: Negative for dizziness, tremors, weakness, numbness and headaches.  Psychiatric/Behavioral: Negative for confusion and sleep disturbance.    Objective:  BP 128/76 (BP Location: Left Arm, Patient Position: Sitting, Cuff Size:  Normal)   Pulse 70   Temp 98.4 F (36.9 C) (Oral)   Ht 5\' 5"  (1.651 m)   Wt 154 lb (69.9 kg)   SpO2 99%   BMI 25.63 kg/m   BP Readings from Last 3 Encounters:  01/25/18 128/76  08/30/17 116/72  08/08/17 124/78    Wt Readings from Last 3 Encounters:  01/25/18 154 lb (69.9 kg)  08/30/17 157 lb (71.2 kg)  08/08/17 157 lb (71.2 kg)    Physical Exam  Constitutional: She appears well-developed. No distress.  HENT:  Head: Normocephalic.  Right Ear: External ear normal.  Left Ear: External ear normal.  Nose: Nose normal.  Mouth/Throat: Oropharynx is clear and moist.  Eyes: Pupils are equal, round, and reactive to light. Conjunctivae are normal. Right eye exhibits no discharge. Left eye exhibits no discharge.  Neck: Normal range of motion. Neck supple. No JVD present. No tracheal deviation present. No thyromegaly present.  Cardiovascular: Normal rate, regular rhythm and normal heart sounds.  Pulmonary/Chest: No stridor. No respiratory distress. She has no wheezes.  Abdominal: Soft. Bowel sounds are normal. She exhibits no distension and no mass. There is no tenderness. There is no rebound and no guarding.  Musculoskeletal: She exhibits tenderness. She exhibits no edema.  Lymphadenopathy:    She has no cervical adenopathy.  Neurological: She displays normal reflexes. No cranial nerve deficit. She exhibits normal muscle tone. Coordination normal.  Skin: No rash noted. No erythema.  Psychiatric: She has a normal mood and affect. Her behavior is normal. Judgment and thought content normal.     Str leg  elev +/- on the L Lab Results  Component Value Date   WBC 5.0 08/15/2017   HGB 13.2 08/15/2017   HCT 39.9 08/15/2017   PLT 234.0 08/15/2017   GLUCOSE 106 (H) 08/15/2017   CHOL 205 (H) 08/15/2017   TRIG 188.0 (H) 08/15/2017   HDL 35.40 (L) 08/15/2017   LDLDIRECT 138.5 11/13/2012   LDLCALC 132 (H) 08/15/2017   ALT 17 08/15/2017   AST 18 08/15/2017   NA 139 08/15/2017   K 3.8  08/15/2017   CL 99 08/15/2017   CREATININE 0.93 08/15/2017   BUN 18 08/15/2017   CO2 33 (H) 08/15/2017   TSH 2.55 08/15/2017   HGBA1C 6.0 12/07/2016    Mm Screening Breast Tomo Bilateral  Result Date: 06/07/2017 CLINICAL DATA:  Screening. EXAM: 2D DIGITAL SCREENING BILATERAL MAMMOGRAM WITH CAD AND ADJUNCT TOMO COMPARISON:  Previous exam(s). ACR Breast Density Category c: The breast tissue is heterogeneously dense, which may obscure small masses. FINDINGS: There are no findings suspicious for malignancy. Images were processed with CAD. IMPRESSION: No mammographic evidence of malignancy. A result letter of this screening mammogram will be mailed directly to the patient. RECOMMENDATION: Screening mammogram in one year. (Code:SM-B-01Y) BI-RADS CATEGORY  1: Negative. Electronically Signed   By: Lovey Newcomer M.D.   On: 06/07/2017 11:46    Assessment & Plan:   There are no diagnoses linked to this encounter. I am having Tina Bird maintain her meclizine, Cholecalciferol, vitamin B-12, escitalopram, famotidine, omeprazole, and triamterene-hydrochlorothiazide.  No orders of the defined types were placed in this encounter.    Follow-up: No follow-ups on file.  Walker Kehr, MD

## 2018-01-25 NOTE — Assessment & Plan Note (Addendum)
X ray w/OA 2018 Prednisone 10 mg: take 4 tabs a day x 3 days; then 3 tabs a day x 4 days; then 2 tabs a day x 4 days, then 1 tab a day x 6 days, then stop. Take pc.  Potential benefits of a short term steroid  use as well as potential risks  and complications were explained to the patient and were aknowledged. F/u w/chiropract MRI if needed ROM exercises

## 2018-01-26 ENCOUNTER — Other Ambulatory Visit: Payer: Self-pay

## 2018-01-26 ENCOUNTER — Telehealth: Payer: Self-pay | Admitting: Internal Medicine

## 2018-01-26 MED ORDER — OMEPRAZOLE 40 MG PO CPDR: 40.0000 mg | DELAYED_RELEASE_CAPSULE | Freq: Every day | ORAL | 1 refills | Status: DC

## 2018-01-26 NOTE — Telephone Encounter (Signed)
Copied from [place] [id number]. Topic: Quick Communication - Rx Refill/Question >> Jan 26, 2018  9:03 AM Cleaster Corin, NT wrote: Medication: omeprazole (PRILOSEC) 40 MG capsule [[id number]]  Has the patient contacted their pharmacy? yes (Agent: If no, request that the patient contact the pharmacy for the refill.) Preferred Pharmacy (with phone number or street name): [id number] [place] [place] [address] [place] [address] Phone: [phone] Fax: [phone]   Agent: Please be advised that RX refills may take up to 3 business days. We ask that you follow-up with your pharmacy.

## 2018-02-01 ENCOUNTER — Ambulatory Visit: Payer: Self-pay | Admitting: *Deleted

## 2018-02-01 NOTE — Telephone Encounter (Signed)
I returned her call regarding coming off of prednisone and feeling so fatigued and having joint aches.   Was wanting to know if there was something Dr. Alain Marion could call in for her to make her feel better.     She had one episode where she vomited once after drinking some coffee this morning but feels fine now as far as her stomach is concerned.   She did have some cramping earlier in her abd but feeling better now.   She mentioned having some diarrhea yesterday and once a small amt this morning.  Her main concern was the extreme fatigue and joint aches she is feeling after stopping the prednisone on Sunday.   It was prescribed for arthritis in her leg which is no longer bothering her.   She said the prednisone completely took care of that.  She does not want to come in for an appt because "I have so many appts and things to do I just don't think I can come in right now".   "I mainly wondered if there was something he could call in for me to make me feel better and have more energy".    I let her know the lack of sleep from not sleeping 4 nights on the prednisone could be affecting how tired she is too.    "I just want to sleep all the time now from not being able to sleep at all on the prednisone".     I have routed a high priority note to Dr. Alain Marion making him aware of her situation and symptoms.  Someone will be in contact with her from the office, I let her know.   Reason for Disposition . MILD vomiting with diarrhea  Answer Assessment - Initial Assessment Questions 1. VOMITING SEVERITY: "How many times have you vomited in the past 24 hours?"     - MILD:  1 - 2 times/day    - MODERATE: 3 - 5 times/day, decreased oral intake without significant weight loss or symptoms of dehydration    - SEVERE: 6 or more times/day, vomits everything or nearly everything, with significant weight loss, symptoms of dehydration      Vomited one time.  Had diarrhea some yesterday and a little this morning.    I'm having cramping. 2. ONSET: "When did the vomiting begin?"      This morning 3. FLUIDS: "What fluids or food have you vomited up today?" "Have you been able to keep any fluids down?"     I'm drinking a Coke now.   I will shortly try to eat. 4. ABDOMINAL PAIN: "Are your having any abdominal pain?" If yes : "How bad is it and what does it feel like?" (e.g., crampy, dull, intermittent, constant)      Cramping.   I've not taken anything 5. DIARRHEA: "Is there any diarrhea?" If so, ask: "How many times today?"      A little this morning and yesterday. 6. CONTACTS: "Is there anyone else in the family with the same symptoms?"      No 7. CAUSE: "What do you think is causing your vomiting?"     I drank some coffee.   It wasn't much at all.   I'm feeling so tired and fatigued with joint symptoms.   Going on since stopped taking Prednisone.   My leg was hurting from arthritis was reason on the prednisone.   Stopped taking it Sunday.   I took it for 4 days.  Stopped on Sunday with prednisone.  I can't sleep with it.   For 4 nights could not sleep.   8. HYDRATION STATUS: "Any signs of dehydration?" (e.g., dry mouth [not only dry lips], too weak to stand) "When did you last urinate?"     I'm drinking fluids. 9. OTHER SYMPTOMS: "Do you have any other symptoms?" (e.g., fever, headache, vertigo, vomiting blood or coffee grounds, recent head injury)     My leg is not hurting now even off the prednisone.   I'm so fatigued and all I want to do is sleep.   I'm so tired.   I'm sleepy.   I'm having joint  Aches I think from the prednisone.     No fever.    10. PREGNANCY: "Is there any chance you are pregnant?" "When was your last menstrual period?"       Asked not due to age.  Protocols used: [facility]

## 2018-02-01 NOTE — Telephone Encounter (Signed)
Please advise 

## 2018-02-02 NOTE — Telephone Encounter (Signed)
Pt.notified

## 2018-02-02 NOTE — Telephone Encounter (Signed)
It could be a virus Use Tylenol and other OTC meds Clear liquids Pls come for OV if sick Thx

## 2018-02-08 ENCOUNTER — Ambulatory Visit: Payer: Medicare Other | Admitting: Internal Medicine

## 2018-04-05 ENCOUNTER — Ambulatory Visit (INDEPENDENT_AMBULATORY_CARE_PROVIDER_SITE_OTHER): Payer: Medicare Other | Admitting: Internal Medicine

## 2018-04-05 ENCOUNTER — Encounter: Payer: Self-pay | Admitting: Internal Medicine

## 2018-04-05 DIAGNOSIS — H1031 Unspecified acute conjunctivitis, right eye: Secondary | ICD-10-CM

## 2018-04-05 DIAGNOSIS — J069 Acute upper respiratory infection, unspecified: Secondary | ICD-10-CM | POA: Diagnosis not present

## 2018-04-05 DIAGNOSIS — H109 Unspecified conjunctivitis: Secondary | ICD-10-CM | POA: Insufficient documentation

## 2018-04-05 MED ORDER — POLYMYXIN B-TRIMETHOPRIM 10000-0.1 UNIT/ML-% OP SOLN: 2.0000 [drp] | OPHTHALMIC | 0 refills | Status: DC

## 2018-04-05 NOTE — Assessment & Plan Note (Signed)
OTC meds 

## 2018-04-05 NOTE — Patient Instructions (Signed)
You can use over-the-counter  "cold" medicines  such as " Delsym" or" Robitussin" cough syrup varietis for cough.  You can use plain "Tylenol" or "Advil" for fever, chills and achyness. Use Halls or Ricola cough drops. ° ° "Common cold" symptoms are usually triggered by a virus.  The antibiotics are usually not necessary. On average, a" viral cold" illness would take 4-7 days to resolve. ° ° Please, make an appointment if you are not better or if you're worse. ° °

## 2018-04-05 NOTE — Progress Notes (Signed)
Subjective:  Patient ID: Tina Bird, female    DOB: [birth year]  Age: 67 y.o. MRN: [id number]  CC: No chief complaint on file.   HPI Tina Bird presents for ST x 1 week and R eye redness x 1 d, eye d/c  Outpatient Medications Prior to Visit  Medication Sig Dispense Refill  . Cholecalciferol 1000 UNITS tablet Take 1,000 Units by mouth daily.      [name] escitalopram (LEXAPRO) 10 MG tablet Take 1 tablet (10 mg total) daily by mouth. 90 tablet 2  . famotidine (PEPCID) 20 MG tablet Take 1 tablet (20 mg total) at bedtime by mouth. 90 tablet 3  . meclizine (ANTIVERT) 25 MG tablet Take 12.5 mg by mouth 3 (three) times daily as needed. verti    . omeprazole (PRILOSEC) 40 MG capsule Take 1 capsule (40 mg total) by mouth daily. 90 capsule 1  . triamterene-hydrochlorothiazide (MAXZIDE-25) 37.5-25 MG tablet Take 1 tablet daily by mouth. 90 tablet 3  . vitamin B-12 (CYANOCOBALAMIN) 1000 MCG tablet Take 1,000 mcg by mouth daily.    . predniSONE (DELTASONE) 10 MG tablet Prednisone 10 mg: take 4 tabs a day x 3 days; then 3 tabs a day x 4 days; then 2 tabs a day x 4 days, then 1 tab a day x 6 days, then stop. Take pc. (Patient not taking: Reported on 04/05/2018) 38 tablet 1   No facility-administered medications prior to visit.     ROS: Review of Systems  Constitutional: Negative for activity change, appetite change, chills, fatigue and unexpected weight change.  HENT: Positive for sore throat. Negative for congestion, mouth sores and sinus pressure.   Eyes: Positive for pain and redness. Negative for visual disturbance.  Respiratory: Positive for cough. Negative for chest tightness.   Gastrointestinal: Negative for abdominal pain and nausea.  Genitourinary: Negative for difficulty urinating, frequency and vaginal pain.  Musculoskeletal: Negative for back pain and gait problem.  Skin: Negative for pallor and rash.  Neurological: Negative for dizziness, tremors, weakness, numbness and  headaches.  Psychiatric/Behavioral: Negative for confusion and sleep disturbance.    Objective:  BP 110/70 (BP Location: Left Arm, Patient Position: Sitting, Cuff Size: Normal)   Pulse 85   Temp 98.3 F (36.8 C) (Oral)   Ht 5\' 5"  (1.651 m)   Wt 151 lb (68.5 kg)   SpO2 97%   BMI 25.13 kg/m   BP Readings from Last 3 Encounters:  04/05/18 110/70  01/25/18 128/76  08/30/17 116/72    Wt Readings from Last 3 Encounters:  04/05/18 151 lb (68.5 kg)  01/25/18 154 lb (69.9 kg)  08/30/17 157 lb (71.2 kg)    Physical Exam  Constitutional: She appears well-developed. No distress.  HENT:  Head: Normocephalic.  Right Ear: External ear normal.  Left Ear: External ear normal.  Nose: Nose normal.  Mouth/Throat: Oropharynx is clear and moist.  Eyes: Pupils are equal, round, and reactive to light. Conjunctivae are normal. Right eye exhibits no discharge. Left eye exhibits no discharge.  Neck: Normal range of motion. Neck supple. No JVD present. No tracheal deviation present. No thyromegaly present.  Cardiovascular: Normal rate, regular rhythm and normal heart sounds.  Pulmonary/Chest: No stridor. No respiratory distress. She has no wheezes.  Abdominal: Soft. Bowel sounds are normal. She exhibits no distension and no mass. There is no tenderness. There is no rebound and no guarding.  Genitourinary: Guaiac stool: eryth throat.  Musculoskeletal: She exhibits no edema or tenderness.  Lymphadenopathy:  She has no cervical adenopathy.  Neurological: She displays normal reflexes. No cranial nerve deficit. She exhibits normal muscle tone. Coordination normal.  Skin: No rash noted. No erythema.  Psychiatric: She has a normal mood and affect. Her behavior is normal. Judgment and thought content normal.    Lab Results  Component Value Date   WBC 5.0 08/15/2017   HGB 13.2 08/15/2017   HCT 39.9 08/15/2017   PLT 234.0 08/15/2017   GLUCOSE 106 (H) 08/15/2017   CHOL 205 (H) 08/15/2017   TRIG  188.0 (H) 08/15/2017   HDL 35.40 (L) 08/15/2017   LDLDIRECT 138.5 11/13/2012   LDLCALC 132 (H) 08/15/2017   ALT 17 08/15/2017   AST 18 08/15/2017   NA 139 08/15/2017   K 3.8 08/15/2017   CL 99 08/15/2017   CREATININE 0.93 08/15/2017   BUN 18 08/15/2017   CO2 33 (H) 08/15/2017   TSH 2.55 08/15/2017   HGBA1C 6.0 12/07/2016    Mm Screening Breast Tomo Bilateral  Result Date: 06/07/2017 CLINICAL DATA:  Screening. EXAM: 2D DIGITAL SCREENING BILATERAL MAMMOGRAM WITH CAD AND ADJUNCT TOMO COMPARISON:  Previous exam(s). ACR Breast Density Category c: The breast tissue is heterogeneously dense, which may obscure small masses. FINDINGS: There are no findings suspicious for malignancy. Images were processed with CAD. IMPRESSION: No mammographic evidence of malignancy. A result letter of this screening mammogram will be mailed directly to the patient. RECOMMENDATION: Screening mammogram in one year. (Code:SM-B-01Y) BI-RADS CATEGORY  1: Negative. Electronically Signed   By: Lovey Newcomer M.D.   On: 06/07/2017 11:46    Assessment & Plan:   There are no diagnoses linked to this encounter.   No orders of the defined types were placed in this encounter.    Follow-up: No follow-ups on file.  Walker Kehr, MD

## 2018-04-05 NOTE — Assessment & Plan Note (Signed)
Rx polytrim

## 2018-05-17 DIAGNOSIS — M9905 Segmental and somatic dysfunction of pelvic region: Secondary | ICD-10-CM | POA: Diagnosis not present

## 2018-05-17 DIAGNOSIS — M5136 Other intervertebral disc degeneration, lumbar region: Secondary | ICD-10-CM | POA: Diagnosis not present

## 2018-05-17 DIAGNOSIS — M5134 Other intervertebral disc degeneration, thoracic region: Secondary | ICD-10-CM | POA: Diagnosis not present

## 2018-05-17 DIAGNOSIS — M9902 Segmental and somatic dysfunction of thoracic region: Secondary | ICD-10-CM | POA: Diagnosis not present

## 2018-05-17 DIAGNOSIS — M9903 Segmental and somatic dysfunction of lumbar region: Secondary | ICD-10-CM | POA: Diagnosis not present

## 2018-06-20 ENCOUNTER — Other Ambulatory Visit: Payer: Self-pay | Admitting: Internal Medicine

## 2018-06-20 DIAGNOSIS — Z1231 Encounter for screening mammogram for malignant neoplasm of breast: Secondary | ICD-10-CM

## 2018-07-06 ENCOUNTER — Encounter: Payer: Self-pay | Admitting: Plastic Surgery

## 2018-07-06 ENCOUNTER — Ambulatory Visit: Payer: Medicare Other | Admitting: Plastic Surgery

## 2018-07-06 VITALS — Resp 13 | Ht 65.0 in | Wt 145.0 lb

## 2018-07-06 DIAGNOSIS — H02831 Dermatochalasis of right upper eyelid: Secondary | ICD-10-CM

## 2018-07-06 DIAGNOSIS — H02834 Dermatochalasis of left upper eyelid: Secondary | ICD-10-CM

## 2018-07-06 NOTE — Progress Notes (Signed)
Patient ID: Tina Bird, female    DOB: [birth year], 67 y.o.   MRN: [id number]   Chief Complaint  Patient presents with  . Eyelid Problem    The patient is a 67 year old white female here for evaluation of her upper lids.  She states that the llids feel very heavy.  She finds her visual field improves greatly when she lifts her lid manually.  It is worse at the mid to end portion of the day.  It is a little bit better in the morning but still not normal.  She underwent a blepharoplasty in the past and noticed a marked improvement for many years.  She now is finding it difficult to read or watch TV in the evening.  She wears bifocals.  She has had her eyes examined recently but she has not had a visual field exam.  She is otherwise in stable health and does not have any ongoing issues.  Her brows are in appropriate position for her age, not excessive in inferior placement and not something that bothers her.  The most important is her ability to see.  On exam she has extreme laxity in the upper lid skin.  Her visual field improves dramatically with lifting her brow as this lifts the skin.  She does have hooding and obstruction of the superior most portion of her pupil.   Review of Systems  Constitutional: Negative.   HENT: Negative.   Eyes: Positive for visual disturbance. Negative for pain, discharge and itching.  Respiratory: Negative.   Cardiovascular: Negative.   Endocrine: Negative.   Genitourinary: Negative.   Musculoskeletal: Negative.   Skin: Negative.   Hematological: Negative.   Psychiatric/Behavioral: Negative.     Past Medical History:  Diagnosis Date  . Asthmatic bronchitis 09-08-12   03-07-12 bronchits x1  . Essential hypertension, benign 05/25/2016  . GERD (gastroesophageal reflux disease)   . LBP (low back pain)   . Melanoma ([place])    Right leg and Right breast  . Menopausal symptoms    Dr. Ree Edman  . Osteoarthritis 07/27/2011   left knee  . Vertigo       Past Surgical History:  Procedure Laterality Date  . CHOLECYSTECTOMY     laparoscopic  . DILATION AND CURETTAGE OF UTERUS    . LAPAROSCOPIC APPENDECTOMY N/A 01/25/2016   Procedure: APPENDECTOMY LAPAROSCOPIC;  Surgeon: Stark Klein, MD;  Location: [facility];  Service: General;  Laterality: N/A;  . PEXY  09/11/2012   Procedure: PEXY;  Surgeon: Adin Hector, MD;  Location: [facility];  Service: General;  Laterality: N/A;  . TONSILLECTOMY    . TRANSANAL HEMORRHOIDAL DEARTERIALIZATION  09/11/2012   Procedure: TRANSANAL HEMORRHOIDAL DEARTERIALIZATION;  Surgeon: Adin Hector, MD;  Location: [facility];  Service: General;  Laterality: N/A;  [place] hemorrhoidal ligation/pexy  . TUBAL LIGATION        Current Outpatient Medications:  .  Cholecalciferol 1000 UNITS tablet, Take 1,000 Units by mouth daily.  , Disp: , Rfl:  .  escitalopram (LEXAPRO) 10 MG tablet, Take 1 tablet (10 mg total) daily by mouth., Disp: 90 tablet, Rfl: 2 .  famotidine (PEPCID) 20 MG tablet, Take 1 tablet (20 mg total) at bedtime by mouth., Disp: 90 tablet, Rfl: 3 .  meclizine (ANTIVERT) 25 MG tablet, Take 12.5 mg by mouth 3 (three) times daily as needed. verti, Disp: , Rfl:  .  omeprazole (PRILOSEC) 40 MG capsule, Take 1 capsule (40 mg total) by mouth daily.,  Disp: 90 capsule, Rfl: 1 .  triamterene-hydrochlorothiazide (MAXZIDE-25) 37.5-25 MG tablet, Take 1 tablet daily by mouth., Disp: 90 tablet, Rfl: 3 .  trimethoprim-polymyxin b (POLYTRIM) ophthalmic solution, Place 2 drops into the right eye every 4 (four) hours. x5 days, Disp: 10 mL, Rfl: 0 .  vitamin B-12 (CYANOCOBALAMIN) 1000 MCG tablet, Take 1,000 mcg by mouth daily., Disp: , Rfl:    Objective:   Vitals:   07/06/18 2022  Resp: 13  SpO2: 96%    Physical Exam  Constitutional: She is oriented to person, place, and time. She appears well-developed and well-nourished.  HENT:  Head: Normocephalic and atraumatic.  Right Ear: External ear normal.  Left Ear: External ear  normal.  Eyes: Pupils are equal, round, and reactive to light. Conjunctivae and EOM are normal. Right eye exhibits no discharge. Left eye exhibits no discharge.  Cardiovascular: Normal rate.  Pulmonary/Chest: Effort normal. No respiratory distress.  Neurological: She is alert and oriented to person, place, and time.  Skin: Skin is warm.  Psychiatric: She has a normal mood and affect. Her behavior is normal.    Assessment & Plan:  Dermatochalasis of both upper eyelids  Patient will need a computerized visual field exam.  Then we will submit for coverage for an upper lid blepharoplasty.  I think this will benefit her greatly.   [place], DO

## 2018-07-10 DIAGNOSIS — H353131 Nonexudative age-related macular degeneration, bilateral, early dry stage: Secondary | ICD-10-CM | POA: Diagnosis not present

## 2018-07-10 DIAGNOSIS — H02834 Dermatochalasis of left upper eyelid: Secondary | ICD-10-CM | POA: Diagnosis not present

## 2018-07-10 DIAGNOSIS — H02831 Dermatochalasis of right upper eyelid: Secondary | ICD-10-CM | POA: Diagnosis not present

## 2018-07-10 DIAGNOSIS — H02423 Myogenic ptosis of bilateral eyelids: Secondary | ICD-10-CM | POA: Diagnosis not present

## 2018-07-10 DIAGNOSIS — H04123 Dry eye syndrome of bilateral lacrimal glands: Secondary | ICD-10-CM | POA: Diagnosis not present

## 2018-07-11 NOTE — Progress Notes (Signed)
The patient had her visual field exam performed on 07/10/18.  The results show significant improvement in her visual field with taping of her upper lids.  This is one of the more dramatic improvements I have seen.  The patient would benefit from an upper lid blepharoplasty.

## 2018-07-14 ENCOUNTER — Encounter: Payer: Self-pay | Admitting: Internal Medicine

## 2018-07-19 ENCOUNTER — Ambulatory Visit
Admission: RE | Admit: 2018-07-19 | Discharge: 2018-07-19 | Disposition: A | Discharge status: Home / Self Care | Payer: Medicare Other | Source: Ambulatory Visit | Attending: Internal Medicine | Admitting: Internal Medicine

## 2018-07-19 ENCOUNTER — Telehealth: Payer: Self-pay | Admitting: Internal Medicine

## 2018-07-19 DIAGNOSIS — Z1231 Encounter for screening mammogram for malignant neoplasm of breast: Secondary | ICD-10-CM

## 2018-07-19 NOTE — Telephone Encounter (Signed)
Copied from [place] [id number]. Topic: General - Other >> Jul 19, 2018  5:00 PM Keene Breath wrote: Reason for CRM: Colletta Maryland from [employer] called to request more clinical information from office regarding a PA that was faxed over earlier today.  Please advise.  CB# [phone], Ext. [phone].

## 2018-07-20 ENCOUNTER — Other Ambulatory Visit: Payer: Self-pay | Admitting: Internal Medicine

## 2018-07-20 MED ORDER — OMEPRAZOLE 40 MG PO CPDR: 40.0000 mg | DELAYED_RELEASE_CAPSULE | Freq: Every day | ORAL | 2 refills | Status: DC

## 2018-07-20 NOTE — Telephone Encounter (Signed)
Copied from [place] [id number]. Topic: Quick Communication - Rx Refill/Question >> Jul 20, 2018  9:11 AM Keene Breath wrote: Medication: omeprazole (PRILOSEC) 40 MG capsule  Patient called to request a refill for the above medication.  CB# [phone]  Preferred Pharmacy (with phone number or street name): [id number] [address], [place] [phone] (Phone) [phone] (Fax)

## 2018-07-21 ENCOUNTER — Telehealth: Payer: Self-pay | Admitting: Internal Medicine

## 2018-07-21 NOTE — Telephone Encounter (Signed)
Patient scheduled with Mickel Baas for next week.

## 2018-07-21 NOTE — Telephone Encounter (Signed)
Copied from [place] [id number]. Topic: Quick Communication - See Telephone Encounter >> Jul 21, 2018 11:04 AM Blase Mess A wrote: CRM for notification. See Telephone encounter for: 07/21/18. Patient believes that she has a yeast infection.  She was wondering if Dr. Marjory Lies can call her in something. Please advise Patient call back number [phone].[id number] [address], [place] [address] [place] [address] Phone: [phone] Fax: [phone]

## 2018-07-21 NOTE — Telephone Encounter (Signed)
Noted, thank you

## 2018-07-24 ENCOUNTER — Other Ambulatory Visit: Payer: Self-pay | Admitting: Family

## 2018-07-24 ENCOUNTER — Encounter: Payer: Self-pay | Admitting: Family

## 2018-07-24 ENCOUNTER — Ambulatory Visit (INDEPENDENT_AMBULATORY_CARE_PROVIDER_SITE_OTHER): Payer: Medicare Other | Admitting: Family

## 2018-07-24 ENCOUNTER — Other Ambulatory Visit: Payer: Medicare Other

## 2018-07-24 VITALS — BP 124/82 | HR 73 | Temp 98.1°F | Ht 65.0 in | Wt 152.0 lb

## 2018-07-24 DIAGNOSIS — N898 Other specified noninflammatory disorders of vagina: Secondary | ICD-10-CM

## 2018-07-24 DIAGNOSIS — R8290 Unspecified abnormal findings in urine: Secondary | ICD-10-CM

## 2018-07-24 LAB — POCT URINALYSIS DIPSTICK
Bilirubin, UA: NEGATIVE
Blood, UA: NEGATIVE
GLUCOSE UA: NEGATIVE
Ketones, UA: NEGATIVE
Leukocytes, UA: NEGATIVE
NITRITE UA: NEGATIVE
PROTEIN UA: NEGATIVE
SPEC GRAV UA: 1.01 (ref 1.010–1.025)
Urobilinogen, UA: 0.2 E.U./dL
pH, UA: 8 (ref 5.0–8.0)

## 2018-07-24 LAB — WET PREP, GENITAL
BACTERIA: NONE SEEN — AB
CLUE CELLS WET PREP: NONE SEEN — AB
Trich, Wet Prep: NONE SEEN — AB
YEAST WET PREP: NONE SEEN — AB

## 2018-07-24 MED ORDER — FLUCONAZOLE 150 MG PO TABS: 150.0000 mg | ORAL_TABLET | Freq: Once | ORAL | 0 refills | Status: AC

## 2018-07-24 NOTE — Progress Notes (Signed)
Tina Bird is a 67 y.o. female with the following history as recorded in EpicCare:  Patient Active Problem List   Diagnosis Date Noted  . Dermatochalasis of both upper eyelids 07/06/2018  . Upper respiratory infection 04/05/2018  . Conjunctivitis 04/05/2018  . Sciatic leg pain 01/25/2018  . Pharyngitis 08/30/2017  . Contact dermatitis 01/26/2017  . Neuropathy 12/07/2016  . Essential hypertension, benign 05/25/2016  . Dyslipidemia 05/25/2016  . Acute appendicitis 01/25/2016  . Rash and nonspecific skin eruption 04/22/2015  . History of colonic polyps 10/08/2014  . Near syncope 11/05/2013  . Heart murmur 11/05/2013  . Well adult exam 10/30/2012  . Internal hemorrhoids with prolapse & pain 07/12/2012  . IBS (irritable bowel syndrome) 07/12/2012  . Diverticulosis of colon with sigmoid stricture 07/12/2012  . Cough 03/07/2012  . Asthmatic bronchitis 03/07/2012  . Hip pain, chronic 12/14/2011  . Glossal erythema 12/14/2011  . Osteoarthritis 07/27/2011  . HIP PAIN 12/02/2010  . MENOPAUSAL SYNDROME 06/02/2010  . DIZZINESS 06/02/2010  . LOW BACK PAIN 05/13/2007    Current Outpatient Medications  Medication Sig Dispense Refill  . Cholecalciferol 1000 UNITS tablet Take 1,000 Units by mouth daily.      [name] escitalopram (LEXAPRO) 10 MG tablet Take 1 tablet (10 mg total) daily by mouth. 90 tablet 2  . famotidine (PEPCID) 20 MG tablet Take 1 tablet (20 mg total) at bedtime by mouth. 90 tablet 3  . meclizine (ANTIVERT) 25 MG tablet Take 12.5 mg by mouth 3 (three) times daily as needed. verti    . omeprazole (PRILOSEC) 40 MG capsule Take 1 capsule (40 mg total) by mouth daily. 90 capsule 2  . triamterene-hydrochlorothiazide (MAXZIDE-25) 37.5-25 MG tablet Take 1 tablet daily by mouth. 90 tablet 3  . trimethoprim-polymyxin b (POLYTRIM) ophthalmic solution Place 2 drops into the right eye every 4 (four) hours. x5 days 10 mL 0  . vitamin B-12 (CYANOCOBALAMIN) 1000 MCG tablet Take 1,000  mcg by mouth daily.     No current facility-administered medications for this visit.     Allergies: Poison ivy extract  Past Medical History:  Diagnosis Date  . Asthmatic bronchitis 09-08-12   03-07-12 bronchits x1  . Essential hypertension, benign 05/25/2016  . GERD (gastroesophageal reflux disease)   . LBP (low back pain)   . Melanoma ([place])    Right leg and Right breast  . Menopausal symptoms    Dr. Ree Edman  . Osteoarthritis 07/27/2011   left knee  . Vertigo     Past Surgical History:  Procedure Laterality Date  . BREAST EXCISIONAL BIOPSY Right    skin CA  . CHOLECYSTECTOMY     laparoscopic  . DILATION AND CURETTAGE OF UTERUS    . LAPAROSCOPIC APPENDECTOMY N/A 01/25/2016   Procedure: APPENDECTOMY LAPAROSCOPIC;  Surgeon: Stark Klein, MD;  Location: [facility];  Service: General;  Laterality: N/A;  . PEXY  09/11/2012   Procedure: PEXY;  Surgeon: Adin Hector, MD;  Location: [facility];  Service: General;  Laterality: N/A;  . TONSILLECTOMY    . TRANSANAL HEMORRHOIDAL DEARTERIALIZATION  09/11/2012   Procedure: TRANSANAL HEMORRHOIDAL DEARTERIALIZATION;  Surgeon: Adin Hector, MD;  Location: [facility];  Service: General;  Laterality: N/A;  [place] hemorrhoidal ligation/pexy  . TUBAL LIGATION      Family History  Problem Relation Age of Onset  . Heart disease Brother   . Diabetes Mother   . Hypertension Mother   . Stroke Sister   . Hypertension Other   . Alzheimer's  disease Other     Social History   Tobacco Use  . Smoking status: Former Smoker    Packs/day: 0.50    Years: 30.00    Pack years: 15.00    Types: Cigarettes    Last attempt to quit: 10/04/2001    Years since quitting: 16.8  . Smokeless tobacco: Never Used  Substance Use Topics  . Alcohol use: Yes    Alcohol/week: 1.0 standard drinks    Types: 1 Glasses of wine per week    Comment: rare wine    Subjective:  Patient presents with concerns for yeast infection x 3 weeks; no recent antibiotics; no vaginal spotting or  bleeding; notes that urine has had a strong odor; has not tried any OTC medications.     Objective:  Vitals:   07/24/18 1100  BP: 124/82  Pulse: 73  Temp: 98.1 F (36.7 C)  TempSrc: Oral  SpO2: 96%  Weight: 152 lb (68.9 kg)  Height: 5\' 5"  (1.651 m)    General: Well developed, well nourished, in no acute distress  Skin : Warm and dry.  Head: Normocephalic and atraumatic  Lungs: Respirations unlabored; Neurologic: Alert and oriented; speech intact; face symmetrical; moves all extremities well; CNII-XII intact without focal deficit  Vaginal exam- no discharge noted; tissues are mildly friable on exam- suspect lack of estrogen;  Assessment:  1. Vaginal discharge   2. Foul smelling urine     Plan:  Update wet prep and U/A today; may need to have patient talk to her GYN about starting topical estrogen. Follow-up to be determined.    No follow-ups on file.  Orders Placed This Encounter  Procedures  . Wet prep, genital    Standing Status:   Future    Number of Occurrences:   1    Standing Expiration Date:   07/24/2019  . Urinalysis    Standing Status:   Future    Standing Expiration Date:   07/24/2019  . POCT Urinalysis Dipstick    Requested Prescriptions    No prescriptions requested or ordered in this encounter

## 2018-07-24 NOTE — Progress Notes (Signed)
Reviewed with patient; explained that urine and wet prep were both unremarkable; will go ahead and treat for yeast infection to cover based on patient's symptoms; however, discussed that she may want to schedule a follow-up with her PCP to discuss symptoms if they persist- may need topical estrogen.

## 2018-08-04 ENCOUNTER — Telehealth: Payer: Self-pay | Admitting: Internal Medicine

## 2018-08-04 NOTE — Telephone Encounter (Signed)
Pt prescription for acyclovir expired 08/06/11; contacted pt to make her aware; offered patient the option to make an appointment for evaluation but she declined; she requests that her request be sent to her provider (Dr Alain Marion, [facility]); explained that prescription refill requests can take 24-72 hours; she verbalized understanding and verified that her pharmacy of choice is [facility] [place], [place]; the pt can also be contacted at [phone]; will route to office for final disposition

## 2018-08-04 NOTE — Telephone Encounter (Signed)
Copied from [place] [id number]. Topic: Quick Communication - Rx Refill/Question >> Aug 04, 2018  8:24 AM Margot Ables wrote: Medication: acyclovir (ZOVIRAX) 400 MG tablet - pt called stating she has a couple of cold sores that are not improving and has used this medication in the past - she has OV 11/6 but cannot wait until then for refill  Has the patient contacted their pharmacy? No - expired Preferred Pharmacy (with phone number or street name):   [id number] [address], [place] [phone] (Phone) [phone] (Fax)

## 2018-08-04 NOTE — Telephone Encounter (Signed)
Please advise 

## 2018-08-07 MED ORDER — ACYCLOVIR 400 MG PO TABS: 400.0000 mg | ORAL_TABLET | Freq: Three times a day (TID) | ORAL | 3 refills | Status: DC

## 2018-08-07 NOTE — Addendum Note (Signed)
Addended by: Cassandria Anger on: 08/07/2018 01:47 PM   Modules accepted: Orders

## 2018-08-08 NOTE — Progress Notes (Addendum)
Subjective:   Avnoor Koury Shore-Wright is a 67 y.o. female who presents for Medicare Annual (Subsequent) preventive examination.  Review of Systems:  No ROS.  Medicare Wellness Visit. Additional risk factors are reflected in the social history.  Cardiac Risk Factors include: advanced age (>25men, >37 women);dyslipidemia;hypertension Sleep patterns: feels rested on waking, gets up 1 times nightly to void and sleeps 6-7 hours nightly.    Home Safety/Smoke Alarms: Feels safe in home. Smoke alarms in place.  Living environment; residence and Firearm Safety: [place], no firearms Lives with husband,  needs tub bench for when she is experiencing dizzy spells, good support system. Seat Belt Safety/Bike Helmet: Wears seat belt.      Objective:     Vitals: BP 126/74   Pulse 74   Resp 17   Ht 5\' 5"  (1.651 m)   Wt 149 lb (67.6 kg)   SpO2 99%   BMI 24.79 kg/m   Body mass index is 24.79 kg/m.  Advanced Directives 08/09/2018 04/14/2017 01/25/2016 09/06/2012  Does Patient Have a Medical Advance Directive? No No No Patient does not have advance directive  Would patient like information on creating a medical advance directive? Yes (ED - Information included in AVS) No - Patient declined - -  Pre-existing out of facility DNR order (yellow form or pink MOST form) - - - No    Tobacco Social History   Tobacco Use  Smoking Status Former Smoker  . Packs/day: 0.50  . Years: 30.00  . Pack years: 15.00  . Types: Cigarettes  . Last attempt to quit: 10/04/2001  . Years since quitting: 16.8  Smokeless Tobacco Never Used     Counseling given: Not Answered  Past Medical History:  Diagnosis Date  . Asthmatic bronchitis 09-08-12   03-07-12 bronchits x1  . Essential hypertension, benign 05/25/2016  . GERD (gastroesophageal reflux disease)   . LBP (low back pain)   . Melanoma ([place])    Right leg and Right breast  . Menopausal symptoms    Dr. Ree Edman  . Osteoarthritis 07/27/2011   left knee  .  Vertigo    Past Surgical History:  Procedure Laterality Date  . BREAST EXCISIONAL BIOPSY Right    skin CA  . CHOLECYSTECTOMY     laparoscopic  . DILATION AND CURETTAGE OF UTERUS    . LAPAROSCOPIC APPENDECTOMY N/A 01/25/2016   Procedure: APPENDECTOMY LAPAROSCOPIC;  Surgeon: Stark Klein, MD;  Location: [facility];  Service: General;  Laterality: N/A;  . PEXY  09/11/2012   Procedure: PEXY;  Surgeon: Adin Hector, MD;  [facility];  Service: General;  Laterality: N/A;  . TONSILLECTOMY    . TRANSANAL HEMORRHOIDAL DEARTERIALIZATION  09/11/2012   Procedure: TRANSANAL HEMORRHOIDAL DEARTERIALIZATION;  Surgeon: Adin Hector, MD;  Location[facility];  Service: General;  Laterality: N/A;  [place] hemorrhoidal ligation/pexy  . TUBAL LIGATION     Family History  Problem Relation Age of Onset  . Heart disease Brother   . Diabetes Mother   . Hypertension Mother   . Stroke Sister   . Hypertension Other   . Alzheimer's disease Other    Social History   Socioeconomic History  . Marital status: Married    Spouse name: Not on file  . Number of children: Not on file  . Years of education: Not on file  . Highest education level: Not on file  Occupational History  . Not on file  Social Needs  . Financial resource strain: Not hard  at all  . Food insecurity:    Worry: Never true    Inability: Never true  . Transportation needs:    Medical: No    Non-medical: No  Tobacco Use  . Smoking status: Former Smoker    Packs/day: 0.50    Years: 30.00    Pack years: 15.00    Types: Cigarettes    Last attempt to quit: 10/04/2001    Years since quitting: 16.8  . Smokeless tobacco: Never Used  Substance and Sexual Activity  . Alcohol use: Yes    Alcohol/week: 1.0 standard drinks    Types: 1 Glasses of  per week    Comment: rare   . Drug use: No  . Sexual activity: Yes  Lifestyle  . Physical activity:    Days per week: 2 days    Minutes per session: 40 min  . Stress: Not at all    Relationships  . Social connections:    Talks on phone: More than three times a week    Gets together: More than three times a week    Attends religious service: More than 4 times per year    Active member of club or organization: Yes    Attends meetings of clubs or organizations: More than 4 times per year    Relationship status: Married  Other Topics Concern  . Not on file  Social History Narrative   Regular Exercise- no    Outpatient Encounter Medications as of 08/09/2018  Medication Sig  . acyclovir (ZOVIRAX) 400 MG tablet Take 1 tablet (400 mg total) by mouth 3 (three) times daily for 10 days.  . Cholecalciferol 1000 UNITS tablet Take 1,000 Units by mouth daily.    [name] escitalopram (LEXAPRO) 10 MG tablet Take 1 tablet (10 mg total) daily by mouth. (Patient taking differently: Take 5 mg by mouth daily. )  . famotidine (PEPCID) 20 MG tablet Take 1 tablet (20 mg total) at bedtime by mouth. (Patient taking differently: Take 20 mg by mouth as needed. )  . meclizine (ANTIVERT) 25 MG tablet Take 12.5 mg by mouth 3 (three) times daily as needed. verti  . omeprazole (PRILOSEC) 40 MG capsule Take 1 capsule (40 mg total) by mouth daily.  [name] triamterene-hydrochlorothiazide (MAXZIDE-25) 37.5-25 MG tablet Take 1 tablet daily by mouth. (Patient taking differently: Take 0.5 tablets by mouth daily. )  . trimethoprim-polymyxin b (POLYTRIM) ophthalmic solution Place 2 drops into the right eye every 4 (four) hours. x5 days  . vitamin B-12 (CYANOCOBALAMIN) 1000 MCG tablet Take 1,000 mcg by mouth daily.   No facility-administered encounter medications on file as of 08/09/2018.     Activities of Daily Living In your present state of health, do you have any difficulty performing the following activities: 08/09/2018  Hearing? N  Vision? N  Difficulty concentrating or making decisions? N  Walking or climbing stairs? N  Dressing or bathing? N  Doing errands, shopping? N  Preparing Food and eating ? N   Using the Toilet? N  In the past six months, have you accidently leaked urine? N  Do you have problems with loss of bowel control? N  Managing your Medications? N  Housekeeping or managing your Housekeeping? N  Some recent data might be hidden    Patient Care Team: Plotnikov, Evie Lacks, MD as PCP - General Carlean Purl Ofilia Neas, MD as Consulting Physician (Gastroenterology)    Assessment:   This is a routine wellness examination for Aryia. Physical assessment deferred to PCP.  Exercise Activities and Dietary recommendations Current Exercise Habits: Home exercise routine, Type of exercise: walking;strength training/weights, Time (Minutes): 30, Frequency (Times/Week): 3, Weekly Exercise (Minutes/Week): 90, Intensity: Mild, Exercise limited by: None identified  Diet (meal preparation, eat out, water intake, caffeinated beverages, dairy products, fruits and vegetables): in general, a "healthy" diet  , well balanced  Reviewed heart healthy diet. Encouraged patient to increase daily water and healthy fluid intake.  Goals    . Patient Stated     I want to go to [facility] more often by freeing up my schedule to have more time.        Fall Risk Fall Risk  08/09/2018 07/24/2018 06/23/2017 05/07/2016 10/08/2014  Falls in the past year? 0 No No No No  Risk for fall due to : Other (Comment) - - - -  Risk for fall due to: Comment has vertigo spells - - - -    Depression Screen PHQ 2/9 Scores 08/09/2018 07/24/2018 06/23/2017 05/07/2016  PHQ - 2 Score 1 0 0 0  PHQ- 9 Score 1 0 - -     Cognitive Function       Ad8 score reviewed for issues:  Issues making decisions: no  Less interest in hobbies / activities: no  Repeats questions, stories (family complaining): no  Trouble using ordinary gadgets (microwave, computer, phone):no  Forgets the month or year: no  Mismanaging finances: no  Remembering appts: no  Daily problems with thinking and/or memory: no Ad8 score is= 0  Immunization  History  Administered Date(s) Administered  . Influenza Split 07/27/2011, 08/29/2012  . Influenza Whole 06/02/2010  . Influenza, High Dose Seasonal PF 07/15/2016, 07/11/2017  . Influenza,inj,Quad PF,6+ Mos 09/12/2013, 07/26/2014, 07/02/2015, 06/25/2018  . Pneumococcal Conjugate-13 08/09/2018  . Pneumococcal Polysaccharide-23 05/07/2016  . Td 06/04/2009  . Zoster 08/11/2011   Screening Tests Health Maintenance  Topic Date Due  . DEXA SCAN  09/14/2014  . TETANUS/TDAP  06/05/2019  . MAMMOGRAM  07/19/2020  . COLONOSCOPY  07/28/2020  . INFLUENZA VACCINE  Completed  . Hepatitis C Screening  Completed  . PNA vac Low Risk Adult  Completed      Plan:    Tub Bench ordered to reduce the risk of falls as patient has frequent dizziness.  Continue to eat heart healthy diet (full of fruits, vegetables, whole grains, lean protein, water--limit salt, fat, and sugar intake) and increase physical activity as tolerated.  Continue doing brain stimulating activities (puzzles, reading, adult coloring books, staying active) to keep memory sharp.   I have personally reviewed and noted the following in the patient's chart:   . Medical and social history . Use of alcohol, tobacco or illicit drugs  . Current medications and supplements . Functional ability and status . Nutritional status . Physical activity . Advanced directives . List of other physicians . Vitals . Screenings to include cognitive, depression, and falls . Referrals and appointments  In addition, I have reviewed and discussed with patient certain preventive protocols, quality metrics, and best practice recommendations. A written personalized care plan for preventive services as well as general preventive health recommendations were provided to patient.     Michiel Cowboy, RN  08/09/2018  Medical screening examination/treatment/procedure(s) were performed by non-physician practitioner and as supervising physician I was immediately  available for consultation/collaboration. I agree with above. Lew Dawes, MD

## 2018-08-09 ENCOUNTER — Ambulatory Visit (INDEPENDENT_AMBULATORY_CARE_PROVIDER_SITE_OTHER): Payer: Medicare Other | Admitting: *Deleted

## 2018-08-09 VITALS — BP 126/74 | HR 74 | Resp 17 | Ht 65.0 in | Wt 149.0 lb

## 2018-08-09 DIAGNOSIS — Z0000 Encounter for general adult medical examination without abnormal findings: Secondary | ICD-10-CM | POA: Diagnosis not present

## 2018-08-09 DIAGNOSIS — R42 Dizziness and giddiness: Secondary | ICD-10-CM | POA: Diagnosis not present

## 2018-08-09 DIAGNOSIS — Z23 Encounter for immunization: Secondary | ICD-10-CM | POA: Diagnosis not present

## 2018-08-09 DIAGNOSIS — Z9181 History of falling: Secondary | ICD-10-CM

## 2018-08-09 DIAGNOSIS — E2839 Other primary ovarian failure: Secondary | ICD-10-CM

## 2018-08-09 NOTE — Addendum Note (Signed)
Addended by: Emelia Loron A on: 08/09/2018 04:51 PM   Modules accepted: Orders

## 2018-08-09 NOTE — Patient Instructions (Addendum)
Web-site for free memory games: [email or web address].[email or web address].dk   Continue to eat heart healthy diet (full of fruits, vegetables, whole grains, lean protein, water--limit salt, fat, and sugar intake) and increase physical activity as tolerated.  Continue doing brain stimulating activities (puzzles, reading, adult coloring books, staying active) to keep memory sharp.    Tina Bird , Thank you for taking time to come for your Medicare Wellness Visit. I appreciate your ongoing commitment to your health goals. Please review the following plan we discussed and let me know if I can assist you in the future.   These are the goals we discussed: Goals    . Patient Stated     I want to go to [facility] more often by freeing up my schedule to have more time.        This is a list of the screening recommended for you and due dates:  Health Maintenance  Topic Date Due  . DEXA scan (bone density measurement)  09/14/2014  . Pneumonia vaccines (2 of 2 - PCV13) 05/07/2017  . Tetanus Vaccine  06/05/2019  . Mammogram  07/19/2020  . Colon Cancer Screening  07/28/2020  . Flu Shot  Completed  .  Hepatitis C: One time screening is recommended by Center for Disease Control  (CDC) for  adults born from [birth year] through 1967.   Completed     Health Maintenance, Female Adopting a healthy lifestyle and getting preventive care can go a long way to promote health and wellness. Talk with your health care provider about what schedule of regular examinations is right for you. This is a good chance for you to check in with your provider about disease prevention and staying healthy. In between checkups, there are plenty of things you can do on your own. Experts have done a lot of research about which lifestyle changes and preventive measures are most likely to keep you healthy. Ask your health care provider for more information. Weight and diet Eat a healthy diet  Be sure to include plenty of vegetables,  fruits, low-fat dairy products, and lean protein.  Do not eat a lot of foods high in solid fats, added sugars, or salt.  Get regular exercise. This is one of the most important things you can do for your health. ? Most adults should exercise for at least 150 minutes each week. The exercise should increase your heart rate and make you sweat (moderate-intensity exercise). ? Most adults should also do strengthening exercises at least twice a week. This is in addition to the moderate-intensity exercise.  Maintain a healthy weight  Body mass index (BMI) is a measurement that can be used to identify possible weight problems. It estimates body fat based on height and weight. Your health care provider can help determine your BMI and help you achieve or maintain a healthy weight.  For females 32 years of age and older: ? A BMI below 18.5 is considered underweight. ? A BMI of 18.5 to 24.9 is normal. ? A BMI of 25 to 29.9 is considered overweight. ? A BMI of 30 and above is considered obese.  Watch levels of cholesterol and blood lipids  You should start having your blood tested for lipids and cholesterol at 69 years of age, then have this test every 5 years.  You may need to have your cholesterol levels checked more often if: ? Your lipid or cholesterol levels are high. ? You are older than 69 years of age. ? You are at high risk  for heart disease.  Cancer screening Lung Cancer  Lung cancer screening is recommended for adults 36-6 years old who are at high risk for lung cancer because of a history of smoking.  A yearly low-dose CT scan of the lungs is recommended for people who: ? Currently smoke. ? Have quit within the past 15 years. ? Have at least a 30-pack-year history of smoking. A pack year is smoking an average of one pack of cigarettes a day for 1 year.  Yearly screening should continue until it has been 15 years since you quit.  Yearly screening should stop if you develop a  health problem that would prevent you from having lung cancer treatment.  Breast Cancer  Practice breast self-awareness. This means understanding how your breasts normally appear and feel.  It also means doing regular breast self-exams. Let your health care provider know about any changes, no matter how small.  If you are in your 20s or 30s, you should have a clinical breast exam (CBE) by a health care provider every 1-3 years as part of a regular health exam.  If you are 5 or older, have a CBE every year. Also consider having a breast X-ray (mammogram) every year.  If you have a family history of breast cancer, talk to your health care provider about genetic screening.  If you are at high risk for breast cancer, talk to your health care provider about having an MRI and a mammogram every year.  Breast cancer gene (BRCA) assessment is recommended for women who have family members with BRCA-related cancers. BRCA-related cancers include: ? Breast. ? Ovarian. ? Tubal. ? Peritoneal cancers.  Results of the assessment will determine the need for genetic counseling and BRCA1 and BRCA2 testing.  Cervical Cancer Your health care provider may recommend that you be screened regularly for cancer of the pelvic organs (ovaries, uterus, and vagina). This screening involves a pelvic examination, including checking for microscopic changes to the surface of your cervix (Pap test). You may be encouraged to have this screening done every 3 years, beginning at age 90.  For women ages 32-65, health care providers may recommend pelvic exams and Pap testing every 3 years, or they may recommend the Pap and pelvic exam, combined with testing for human papilloma virus (HPV), every 5 years. Some types of HPV increase your risk of cervical cancer. Testing for HPV may also be done on women of any age with unclear Pap test results.  Other health care providers may not recommend any screening for nonpregnant women who  are considered low risk for pelvic cancer and who do not have symptoms. Ask your health care provider if a screening pelvic exam is right for you.  If you have had past treatment for cervical cancer or a condition that could lead to cancer, you need Pap tests and screening for cancer for at least 20 years after your treatment. If Pap tests have been discontinued, your risk factors (such as having a new sexual partner) need to be reassessed to determine if screening should resume. Some women have medical problems that increase the chance of getting cervical cancer. In these cases, your health care provider may recommend more frequent screening and Pap tests.  Colorectal Cancer  This type of cancer can be detected and often prevented.  Routine colorectal cancer screening usually begins at 69 years of age and continues through 69 years of age.  Your health care provider may recommend screening at an earlier age if  you have risk factors for colon cancer.  Your health care provider may also recommend using home test kits to check for hidden blood in the stool.  A small camera at the end of a tube can be used to examine your colon directly (sigmoidoscopy or colonoscopy). This is done to check for the earliest forms of colorectal cancer.  Routine screening usually begins at age 4.  Direct examination of the colon should be repeated every 5-10 years through 69 years of age. However, you may need to be screened more often if early forms of precancerous polyps or small growths are found.  Skin Cancer  Check your skin from head to toe regularly.  Tell your health care provider about any new moles or changes in moles, especially if there is a change in a mole's shape or color.  Also tell your health care provider if you have a mole that is larger than the size of a pencil eraser.  Always use sunscreen. Apply sunscreen liberally and repeatedly throughout the day.  Protect yourself by wearing long  sleeves, pants, a wide-brimmed hat, and sunglasses whenever you are outside.  Heart disease, diabetes, and high blood pressure  High blood pressure causes heart disease and increases the risk of stroke. High blood pressure is more likely to develop in: ? People who have blood pressure in the high end of the normal range (130-139/85-89 mm Hg). ? People who are overweight or obese. ? People who are African American.  If you are 55-6 years of age, have your blood pressure checked every 3-5 years. If you are 12 years of age or older, have your blood pressure checked every year. You should have your blood pressure measured twice-once when you are at a hospital or clinic, and once when you are not at a hospital or clinic. Record the average of the two measurements. To check your blood pressure when you are not at a hospital or clinic, you can use: ? An automated blood pressure machine at a pharmacy. ? A home blood pressure monitor.  If you are between 66 years and 22 years old, ask your health care provider if you should take aspirin to prevent strokes.  Have regular diabetes screenings. This involves taking a blood sample to check your fasting blood sugar level. ? If you are at a normal weight and have a low risk for diabetes, have this test once every three years after 69 years of age. ? If you are overweight and have a high risk for diabetes, consider being tested at a younger age or more often. Preventing infection Hepatitis B  If you have a higher risk for hepatitis B, you should be screened for this virus. You are considered at high risk for hepatitis B if: ? You were born in a country where hepatitis B is common. Ask your health care provider which countries are considered high risk. ? Your parents were born in a high-risk country, and you have not been immunized against hepatitis B (hepatitis B vaccine). ? You have HIV or AIDS. ? You use needles to inject street drugs. ? You live with  someone who has hepatitis B. ? You have had sex with someone who has hepatitis B. ? You get hemodialysis treatment. ? You take certain medicines for conditions, including cancer, organ transplantation, and autoimmune conditions.  Hepatitis C  Blood testing is recommended for: ? Everyone born from [birth year] through 1967. ? Anyone with known risk factors for hepatitis C.  Sexually  transmitted infections (STIs)  You should be screened for sexually transmitted infections (STIs) including gonorrhea and chlamydia if: ? You are sexually active and are younger than 69 years of age. ? You are older than 69 years of age and your health care provider tells you that you are at risk for this type of infection. ? Your sexual activity has changed since you were last screened and you are at an increased risk for chlamydia or gonorrhea. Ask your health care provider if you are at risk.  If you do not have HIV, but are at risk, it may be recommended that you take a prescription medicine daily to prevent HIV infection. This is called pre-exposure prophylaxis (PrEP). You are considered at risk if: ? You are sexually active and do not regularly use condoms or know the HIV status of your partner(s). ? You take drugs by injection. ? You are sexually active with a partner who has HIV.  Talk with your health care provider about whether you are at high risk of being infected with HIV. If you choose to begin PrEP, you should first be tested for HIV. You should then be tested every 3 months for as long as you are taking PrEP. Pregnancy  If you are premenopausal and you may become pregnant, ask your health care provider about preconception counseling.  If you may become pregnant, take 400 to 800 micrograms (mcg) of folic acid every day.  If you want to prevent pregnancy, talk to your health care provider about birth control (contraception). Osteoporosis and menopause  Osteoporosis is a disease in which the bones lose  minerals and strength with aging. This can result in serious bone fractures. Your risk for osteoporosis can be identified using a bone density scan.  If you are 81 years of age or older, or if you are at risk for osteoporosis and fractures, ask your health care provider if you should be screened.  Ask your health care provider whether you should take a calcium or vitamin D supplement to lower your risk for osteoporosis.  Menopause may have certain physical symptoms and risks.  Hormone replacement therapy may reduce some of these symptoms and risks. Talk to your health care provider about whether hormone replacement therapy is right for you. Follow these instructions at home:  Schedule regular health, dental, and eye exams.  Stay current with your immunizations.  Do not use any tobacco products including cigarettes, chewing tobacco, or electronic cigarettes.  If you are pregnant, do not drink alcohol.  If you are breastfeeding, limit how much and how often you drink alcohol.  Limit alcohol intake to no more than 1 drink per day for nonpregnant women. One drink equals 12 ounces of beer, 5 ounces of wine, or 1 ounces of hard liquor.  Do not use street drugs.  Do not share needles.  Ask your health care provider for help if you need support or information about quitting drugs.  Tell your health care provider if you often feel depressed.  Tell your health care provider if you have ever been abused or do not feel safe at home. This information is not intended to replace advice given to you by your health care provider. Make sure you discuss any questions you have with your health care provider. Document Released: 04/05/2011 Document Revised: 02/26/2016 Document Reviewed: 06/24/2015 Elsevier Interactive Patient Education  [employer].

## 2018-08-15 ENCOUNTER — Other Ambulatory Visit: Payer: Medicare Other

## 2018-08-25 ENCOUNTER — Telehealth: Payer: Self-pay | Admitting: *Deleted

## 2018-08-25 ENCOUNTER — Telehealth: Payer: Self-pay | Admitting: Internal Medicine

## 2018-08-25 NOTE — Telephone Encounter (Signed)
Copied from [place] [id number]. Topic: Quick Communication - See Telephone Encounter >> Aug 25, 2018  9:25 AM Blase Mess A wrote: CRM for notification. See Telephone encounter for: 08/25/18.  Patient is calling to speak to [facility] nurse regarding a complaint. The patient was advised that the [facility] could take the complaint directly. She asked to speak to [facility]. Please advise [phone]

## 2018-08-25 NOTE — Telephone Encounter (Signed)
Called and spoke to patient in response to a CRM message that patient requested to speak to nurse. Patient shared that her husband that is a patient of doctor Plotnikov has been trying to get his prescriptions for escitalopram and metoprolol refilled for several days and that he is not getting a clear answer regarding to why these prescriptions have not been sent in. Adding that this has happened several times in the past.  Nurse apologized to the patient that this has been an issue and the nurse did send in the refills to the pharmacy electronically during the telephone conversation. Mr. Joya Gaskins was just seen by Dr. Alain Marion 08/24/18. Nurse also stated that she would make her manager aware of the situation. The patient was appreciative.

## 2018-09-24 ENCOUNTER — Other Ambulatory Visit: Payer: Self-pay | Admitting: Internal Medicine

## 2018-10-24 ENCOUNTER — Encounter: Payer: Self-pay | Admitting: Internal Medicine

## 2018-10-24 ENCOUNTER — Ambulatory Visit (INDEPENDENT_AMBULATORY_CARE_PROVIDER_SITE_OTHER): Payer: Medicare Other | Admitting: Internal Medicine

## 2018-10-24 ENCOUNTER — Ambulatory Visit (INDEPENDENT_AMBULATORY_CARE_PROVIDER_SITE_OTHER)
Admission: RE | Admit: 2018-10-24 | Discharge: 2018-10-24 | Disposition: A | Discharge status: Home / Self Care | Payer: Medicare Other | Source: Ambulatory Visit | Attending: Internal Medicine | Admitting: Internal Medicine

## 2018-10-24 VITALS — BP 122/70 | HR 78 | Temp 98.0°F | Wt 153.0 lb

## 2018-10-24 DIAGNOSIS — E785 Hyperlipidemia, unspecified: Secondary | ICD-10-CM

## 2018-10-24 DIAGNOSIS — Z0000 Encounter for general adult medical examination without abnormal findings: Secondary | ICD-10-CM

## 2018-10-24 DIAGNOSIS — I10 Essential (primary) hypertension: Secondary | ICD-10-CM | POA: Diagnosis not present

## 2018-10-24 DIAGNOSIS — E2839 Other primary ovarian failure: Secondary | ICD-10-CM

## 2018-10-24 MED ORDER — ESCITALOPRAM OXALATE 5 MG PO TABS: 5.0000 mg | ORAL_TABLET | Freq: Every day | ORAL | 3 refills | Status: DC

## 2018-10-24 NOTE — Progress Notes (Signed)
Subjective:  Patient ID: Tina Bird, female    DOB: 1950/12/14  Age: 68 y.o. MRN: [id number]  CC: No chief complaint on file.   HPI Show Low presents for anxiety, insomnia, HTN, dyslipidemia  Outpatient Medications Prior to Visit  Medication Sig Dispense Refill  . Cholecalciferol 1000 UNITS tablet Take 1,000 Units by mouth daily.      [name] escitalopram (LEXAPRO) 10 MG tablet Take 0.5 tablets (5 mg total) by mouth daily. Overdue for annual appt w/labs must see provider for refills 30 tablet 0  . meclizine (ANTIVERT) 25 MG tablet Take 12.5 mg by mouth 3 (three) times daily as needed. verti    . omeprazole (PRILOSEC) 40 MG capsule Take 1 capsule (40 mg total) by mouth daily. 90 capsule 2  . triamterene-hydrochlorothiazide (MAXZIDE-25) 37.5-25 MG tablet Take 1 tablet daily by mouth. (Patient taking differently: Take 0.5 tablets by mouth daily. ) 90 tablet 3  . vitamin B-12 (CYANOCOBALAMIN) 1000 MCG tablet Take 1,000 mcg by mouth daily.    . famotidine (PEPCID) 20 MG tablet Take 1 tablet (20 mg total) at bedtime by mouth. (Patient not taking: Reported on 10/24/2018) 90 tablet 3  . trimethoprim-polymyxin b (POLYTRIM) ophthalmic solution Place 2 drops into the right eye every 4 (four) hours. x5 days (Patient not taking: Reported on 10/24/2018) 10 mL 0   No facility-administered medications prior to visit.     ROS: Review of Systems  Constitutional: Negative for activity change, appetite change, chills, fatigue and unexpected weight change.  HENT: Negative for congestion, mouth sores and sinus pressure.   Eyes: Negative for visual disturbance.  Respiratory: Negative for cough and chest tightness.   Gastrointestinal: Negative for abdominal pain and nausea.  Genitourinary: Negative for difficulty urinating, frequency and vaginal pain.  Musculoskeletal: Negative for back pain and gait problem.  Skin: Negative for pallor and rash.  Neurological: Negative for dizziness,  tremors, weakness, numbness and headaches.  Psychiatric/Behavioral: Negative for confusion and sleep disturbance. The patient is nervous/anxious.     Objective:  BP 122/70   Pulse 78   Temp 98 F (36.7 C)   Wt 153 lb (69.4 kg)   SpO2 97%   BMI 25.46 kg/m   BP Readings from Last 3 Encounters:  10/24/18 122/70  08/09/18 126/74  07/24/18 124/82    Wt Readings from Last 3 Encounters:  10/24/18 153 lb (69.4 kg)  08/09/18 149 lb (67.6 kg)  07/24/18 152 lb (68.9 kg)    Physical Exam Constitutional:      General: She is not in acute distress.    Appearance: She is well-developed.  HENT:     Head: Normocephalic.     Right Ear: External ear normal.     Left Ear: External ear normal.     Nose: Nose normal.  Eyes:     General:        Right eye: No discharge.        Left eye: No discharge.     Conjunctiva/sclera: Conjunctivae normal.     Pupils: Pupils are equal, round, and reactive to light.  Neck:     Musculoskeletal: Normal range of motion and neck supple.     Thyroid: No thyromegaly.     Vascular: No JVD.     Trachea: No tracheal deviation.  Cardiovascular:     Rate and Rhythm: Normal rate and regular rhythm.     Heart sounds: Normal heart sounds.  Pulmonary:     Effort: No respiratory distress.  Breath sounds: No stridor. No wheezing.  Abdominal:     General: Bowel sounds are normal. There is no distension.     Palpations: Abdomen is soft. There is no mass.     Tenderness: There is no abdominal tenderness. There is no guarding or rebound.  Musculoskeletal:        General: No tenderness.  Lymphadenopathy:     Cervical: No cervical adenopathy.  Skin:    Findings: No erythema or rash.  Neurological:     Cranial Nerves: No cranial nerve deficit.     Motor: No abnormal muscle tone.     Coordination: Coordination normal.     Deep Tendon Reflexes: Reflexes normal.  Psychiatric:        Behavior: Behavior normal.        Thought Content: Thought content normal.         Judgment: Judgment normal.     Lab Results  Component Value Date   WBC 5.0 08/15/2017   HGB 13.2 08/15/2017   HCT 39.9 08/15/2017   PLT 234.0 08/15/2017   GLUCOSE 106 (H) 08/15/2017   CHOL 205 (H) 08/15/2017   TRIG 188.0 (H) 08/15/2017   HDL 35.40 (L) 08/15/2017   LDLDIRECT 138.5 11/13/2012   LDLCALC 132 (H) 08/15/2017   ALT 17 08/15/2017   AST 18 08/15/2017   NA 139 08/15/2017   K 3.8 08/15/2017   CL 99 08/15/2017   CREATININE 0.93 08/15/2017   BUN 18 08/15/2017   CO2 33 (H) 08/15/2017   TSH 2.55 08/15/2017   HGBA1C 6.0 12/07/2016    Mm 3d Screen Breast Bilateral  Result Date: 07/19/2018 CLINICAL DATA:  Screening. EXAM: DIGITAL SCREENING BILATERAL MAMMOGRAM WITH TOMO AND CAD COMPARISON:  Previous exam(s). ACR Breast Density Category c: The breast tissue is heterogeneously dense, which may obscure small masses. FINDINGS: There are no findings suspicious for malignancy. Images were processed with CAD. IMPRESSION: No mammographic evidence of malignancy. A result letter of this screening mammogram will be mailed directly to the patient. RECOMMENDATION: Screening mammogram in one year. (Code:SM-B-01Y) BI-RADS CATEGORY  1: Negative. Electronically Signed   By: Lajean Manes M.D.   On: 07/19/2018 14:45    Assessment & Plan:   There are no diagnoses linked to this encounter.   No orders of the defined types were placed in this encounter.    Follow-up: No follow-ups on file.  Walker Kehr, MD

## 2018-10-24 NOTE — Assessment & Plan Note (Addendum)
ASA 81  cardiac CT scan for calcium scoring offered

## 2018-10-24 NOTE — Assessment & Plan Note (Signed)
cardiac CT scan for calcium scoring offered 

## 2018-10-24 NOTE — Patient Instructions (Signed)

## 2018-11-13 ENCOUNTER — Telehealth: Payer: Self-pay | Admitting: Internal Medicine

## 2018-11-13 MED ORDER — TRIAMTERENE-HCTZ 37.5-25 MG PO TABS: 0.5000 | ORAL_TABLET | Freq: Every day | ORAL | 3 refills | Status: DC

## 2018-11-13 NOTE — Telephone Encounter (Signed)
RX sent

## 2018-11-13 NOTE — Telephone Encounter (Signed)
Copied from [place] [id number]. Topic: Quick Communication - See Telephone Encounter >> Nov 13, 2018 11:46 AM Conception Chancy, NT wrote: CRM for notification. See Telephone encounter for: 11/13/18.  Patient is calling and requesting a refill on triamterene-hydrochlorothiazide (MAXZIDE-25) 37.5-25 MG tablet 90 day supply. Patient states she is completely out and that this was supposed to be called in on 10/24/18 visit. Please advise.  [id number] [place], [place] [address] [place] [address] Phone: [phone] Fax: [phone]

## 2018-11-14 ENCOUNTER — Ambulatory Visit (INDEPENDENT_AMBULATORY_CARE_PROVIDER_SITE_OTHER): Payer: Medicare Other | Admitting: Plastic Surgery

## 2018-11-14 ENCOUNTER — Encounter: Payer: Self-pay | Admitting: Plastic Surgery

## 2018-11-14 VITALS — BP 120/71 | HR 80 | Temp 99.2°F | Ht 65.0 in | Wt 154.0 lb

## 2018-11-14 DIAGNOSIS — H02831 Dermatochalasis of right upper eyelid: Secondary | ICD-10-CM

## 2018-11-14 DIAGNOSIS — H02834 Dermatochalasis of left upper eyelid: Secondary | ICD-10-CM

## 2018-11-14 NOTE — Progress Notes (Signed)
   Subjective:    Patient ID: Tina Bird, female    DOB: [birth year], 68 y.o.   MRN: [id number]  The patient is a 68 year old female here for history and physical for an upper lid blepharoplasty.  She is now interested in the possibility of a lower face neck lift as well.  We will get a quote to her.  She has lax skin on the lower portion of her face and neck with extreme wrinkling.  She also has a strong platysma bands in the midline.  Her skin is in good condition.  She has not had any recent illnesses or infections.  When she had a visual field exam done she had extreme improvement after the taping of her upper lids.   Review of Systems  Constitutional: Negative.   HENT: Negative.   Eyes: Negative.   Respiratory: Negative.   Cardiovascular: Negative.   Gastrointestinal: Negative.   Endocrine: Negative.   Genitourinary: Negative.   Musculoskeletal: Negative.   Skin: Negative for color change and wound.  Psychiatric/Behavioral: Negative.        Objective:   Physical Exam Vitals signs and nursing note reviewed.  Constitutional:      Appearance: Normal appearance.  HENT:     Head: Normocephalic and atraumatic.     Nose: Nose normal.     Mouth/Throat:     Mouth: Mucous membranes are moist.  Cardiovascular:     Rate and Rhythm: Normal rate.  Pulmonary:     Effort: Pulmonary effort is normal.  Neurological:     General: No focal deficit present.     Mental Status: She is alert.  Psychiatric:        Mood and Affect: Mood normal.        Thought Content: Thought content normal.        Judgment: Judgment normal.       Assessment & Plan:  Dermatochalasis of both upper eyelids Quote given for neck lift.  Will need to reschedule if the patient wants to add the neck so we can do the bleph and neck together.

## 2018-11-14 NOTE — H&P (View-Only) (Signed)
   Subjective:    Patient ID: Tina Bird, female    DOB: July 28, 1951, 68 y.o.   MRN: [id number]  The patient is a 68 year old female here for history and physical for an upper lid blepharoplasty.  She is now interested in the possibility of a lower face neck lift as well.  We will get a quote to her.  She has lax skin on the lower portion of her face and neck with extreme wrinkling.  She also has a strong platysma bands in the midline.  Her skin is in good condition.  She has not had any recent illnesses or infections.  When she had a visual field exam done she had extreme improvement after the taping of her upper lids.   Review of Systems  Constitutional: Negative.   HENT: Negative.   Eyes: Negative.   Respiratory: Negative.   Cardiovascular: Negative.   Gastrointestinal: Negative.   Endocrine: Negative.   Genitourinary: Negative.   Musculoskeletal: Negative.   Skin: Negative for color change and wound.  Psychiatric/Behavioral: Negative.        Objective:   Physical Exam Vitals signs and nursing note reviewed.  Constitutional:      Appearance: Normal appearance.  HENT:     Head: Normocephalic and atraumatic.     Nose: Nose normal.     Mouth/Throat:     Mouth: Mucous membranes are moist.  Cardiovascular:     Rate and Rhythm: Normal rate.  Pulmonary:     Effort: Pulmonary effort is normal.  Neurological:     General: No focal deficit present.     Mental Status: She is alert.  Psychiatric:        Mood and Affect: Mood normal.        Thought Content: Thought content normal.        Judgment: Judgment normal.       Assessment & Plan:  Dermatochalasis of both upper eyelids Quote given for neck lift.  Will need to reschedule if the patient wants to add the neck so we can do the bleph and neck together.

## 2018-11-23 ENCOUNTER — Encounter (HOSPITAL_BASED_OUTPATIENT_CLINIC_OR_DEPARTMENT_OTHER): Payer: Self-pay | Admitting: *Deleted

## 2018-11-23 ENCOUNTER — Other Ambulatory Visit: Payer: Self-pay

## 2018-11-27 NOTE — Progress Notes (Signed)
SZP #[id number] - 72 hours - no orders.

## 2018-11-28 ENCOUNTER — Encounter (HOSPITAL_BASED_OUTPATIENT_CLINIC_OR_DEPARTMENT_OTHER)
Admission: RE | Admit: 2018-11-28 | Discharge: 2018-11-28 | Disposition: A | Discharge status: Home / Self Care | Payer: Medicare Other | Source: Ambulatory Visit | Attending: Plastic Surgery | Admitting: Plastic Surgery

## 2018-11-28 DIAGNOSIS — I10 Essential (primary) hypertension: Secondary | ICD-10-CM | POA: Diagnosis not present

## 2018-11-28 DIAGNOSIS — Z01818 Encounter for other preprocedural examination: Secondary | ICD-10-CM | POA: Insufficient documentation

## 2018-11-28 DIAGNOSIS — H02834 Dermatochalasis of left upper eyelid: Secondary | ICD-10-CM | POA: Diagnosis not present

## 2018-11-28 DIAGNOSIS — Z87891 Personal history of nicotine dependence: Secondary | ICD-10-CM | POA: Diagnosis not present

## 2018-11-28 DIAGNOSIS — H02831 Dermatochalasis of right upper eyelid: Secondary | ICD-10-CM | POA: Diagnosis not present

## 2018-11-28 LAB — BASIC METABOLIC PANEL
Anion gap: 9 (ref 5–15)
BUN: 14 mg/dL (ref 8–23)
CO2: 31 mmol/L (ref 22–32)
Calcium: 9.5 mg/dL (ref 8.9–10.3)
Chloride: 100 mmol/L (ref 98–111)
Creatinine, Ser: 0.82 mg/dL (ref 0.44–1.00)
GFR calc Af Amer: >60 mL/min (ref >60)
GFR calc non Af Amer: >60 mL/min (ref >60)
Glucose, Bld: 98 mg/dL (ref 70–99)
Potassium: 3.6 mmol/L (ref 3.5–5.1)
Sodium: 140 mmol/L (ref 135–145)

## 2018-11-28 NOTE — Progress Notes (Signed)
EKG reviewed by Dr. Turk, will proceed with surgery as scheduled. 

## 2018-11-30 ENCOUNTER — Encounter (HOSPITAL_BASED_OUTPATIENT_CLINIC_OR_DEPARTMENT_OTHER)
Admission: RE | Disposition: A | Discharge status: Home / Self Care | Payer: Self-pay | Source: Home / Self Care | Attending: Plastic Surgery

## 2018-11-30 ENCOUNTER — Ambulatory Visit (HOSPITAL_BASED_OUTPATIENT_CLINIC_OR_DEPARTMENT_OTHER): Payer: Medicare Other | Admitting: Anesthesiology

## 2018-11-30 ENCOUNTER — Encounter (HOSPITAL_BASED_OUTPATIENT_CLINIC_OR_DEPARTMENT_OTHER): Payer: Self-pay | Admitting: *Deleted

## 2018-11-30 ENCOUNTER — Ambulatory Visit (HOSPITAL_BASED_OUTPATIENT_CLINIC_OR_DEPARTMENT_OTHER)
Admission: RE | Admit: 2018-11-30 | Discharge: 2018-11-30 | Disposition: A | Discharge status: Home / Self Care | Payer: Medicare Other | Attending: Plastic Surgery | Admitting: Plastic Surgery

## 2018-11-30 DIAGNOSIS — Z87891 Personal history of nicotine dependence: Secondary | ICD-10-CM | POA: Diagnosis not present

## 2018-11-30 DIAGNOSIS — I10 Essential (primary) hypertension: Secondary | ICD-10-CM | POA: Diagnosis not present

## 2018-11-30 DIAGNOSIS — H02834 Dermatochalasis of left upper eyelid: Secondary | ICD-10-CM | POA: Insufficient documentation

## 2018-11-30 DIAGNOSIS — H02831 Dermatochalasis of right upper eyelid: Secondary | ICD-10-CM | POA: Insufficient documentation

## 2018-11-30 HISTORY — PX: BROW LIFT: SHX178

## 2018-11-30 SURGERY — BLEPHAROPLASTY
Anesthesia: General | Site: Eye | Laterality: Bilateral

## 2018-11-30 MED ORDER — LACTATED RINGERS IV SOLN
INTRAVENOUS | Status: DC
  Administered 2018-11-30: 07:00:00 via INTRAVENOUS

## 2018-11-30 MED ORDER — SODIUM CHLORIDE 0.9 % IV SOLN: 250.0000 mL | INTRAVENOUS | Status: DC | PRN

## 2018-11-30 MED ORDER — SODIUM CHLORIDE 0.9% FLUSH: 3.0000 mL | INTRAVENOUS | Status: DC | PRN

## 2018-11-30 MED ORDER — MIDAZOLAM HCL 2 MG/2ML IJ SOLN
INTRAMUSCULAR | Status: AC
  Filled 2018-11-30: qty 2

## 2018-11-30 MED ORDER — DEXAMETHASONE SODIUM PHOSPHATE 10 MG/ML IJ SOLN
INTRAMUSCULAR | Status: DC | PRN
  Administered 2018-11-30: 4 mg via INTRAVENOUS

## 2018-11-30 MED ORDER — FENTANYL CITRATE (PF) 100 MCG/2ML IJ SOLN: 25.0000 ug | INTRAMUSCULAR | Status: DC | PRN

## 2018-11-30 MED ORDER — LIDOCAINE 2% (20 MG/ML) 5 ML SYRINGE
INTRAMUSCULAR | Status: AC
  Filled 2018-11-30: qty 5

## 2018-11-30 MED ORDER — TOBRAMYCIN-DEXAMETHASONE 0.3-0.1 % OP OINT
TOPICAL_OINTMENT | OPHTHALMIC | Status: AC
  Filled 2018-11-30: qty 3.5

## 2018-11-30 MED ORDER — PROPOFOL 10 MG/ML IV BOLUS
INTRAVENOUS | Status: DC | PRN
  Administered 2018-11-30: 200 mg via INTRAVENOUS

## 2018-11-30 MED ORDER — BUPIVACAINE-EPINEPHRINE (PF) 0.25% -1:200000 IJ SOLN
INTRAMUSCULAR | Status: AC
  Filled 2018-11-30: qty 30

## 2018-11-30 MED ORDER — ARTIFICIAL TEARS OPHTHALMIC OINT
TOPICAL_OINTMENT | OPHTHALMIC | Status: DC | PRN
  Administered 2018-11-30: 1 via OPHTHALMIC

## 2018-11-30 MED ORDER — BUPIVACAINE HCL (PF) 0.25 % IJ SOLN
INTRAMUSCULAR | Status: AC
  Filled 2018-11-30: qty 30

## 2018-11-30 MED ORDER — PHENYLEPHRINE 40 MCG/ML (10ML) SYRINGE FOR IV PUSH (FOR BLOOD PRESSURE SUPPORT)
PREFILLED_SYRINGE | INTRAVENOUS | Status: DC | PRN
  Administered 2018-11-30: 80 ug via INTRAVENOUS
  Administered 2018-11-30: 120 ug via INTRAVENOUS
  Administered 2018-11-30: 80 ug via INTRAVENOUS

## 2018-11-30 MED ORDER — EPHEDRINE 5 MG/ML INJ
INTRAVENOUS | Status: AC
  Filled 2018-11-30: qty 10

## 2018-11-30 MED ORDER — LIDOCAINE-EPINEPHRINE 1 %-1:100000 IJ SOLN
INTRAMUSCULAR | Status: AC
  Filled 2018-11-30: qty 1

## 2018-11-30 MED ORDER — BSS IO SOLN
INTRAOCULAR | Status: AC
  Filled 2018-11-30: qty 15

## 2018-11-30 MED ORDER — FENTANYL CITRATE (PF) 100 MCG/2ML IJ SOLN
INTRAMUSCULAR | Status: AC
  Filled 2018-11-30: qty 2

## 2018-11-30 MED ORDER — OXYCODONE HCL 5 MG PO TABS: 5.0000 mg | ORAL_TABLET | ORAL | Status: DC | PRN

## 2018-11-30 MED ORDER — MEPERIDINE HCL 25 MG/ML IJ SOLN: 6.2500 mg | INTRAMUSCULAR | Status: DC | PRN

## 2018-11-30 MED ORDER — FENTANYL CITRATE (PF) 100 MCG/2ML IJ SOLN: 50.0000 ug | INTRAMUSCULAR | Status: DC | PRN

## 2018-11-30 MED ORDER — SCOPOLAMINE 1 MG/3DAYS TD PT72: 1.0000 | MEDICATED_PATCH | Freq: Once | TRANSDERMAL | Status: DC | PRN

## 2018-11-30 MED ORDER — DEXAMETHASONE SODIUM PHOSPHATE 10 MG/ML IJ SOLN
INTRAMUSCULAR | Status: AC
  Filled 2018-11-30: qty 1

## 2018-11-30 MED ORDER — BACITRACIN-POLYMYXIN B 500-10000 UNIT/GM OP OINT
TOPICAL_OINTMENT | OPHTHALMIC | Status: AC
  Filled 2018-11-30: qty 3.5

## 2018-11-30 MED ORDER — SODIUM CHLORIDE 0.9% FLUSH: 3.0000 mL | Freq: Two times a day (BID) | INTRAVENOUS | Status: DC

## 2018-11-30 MED ORDER — ACETAMINOPHEN 325 MG PO TABS: 650.0000 mg | ORAL_TABLET | ORAL | Status: DC | PRN

## 2018-11-30 MED ORDER — CEFAZOLIN SODIUM-DEXTROSE 2-4 GM/100ML-% IV SOLN
INTRAVENOUS | Status: AC
  Filled 2018-11-30: qty 100

## 2018-11-30 MED ORDER — LIDOCAINE 2% (20 MG/ML) 5 ML SYRINGE
INTRAMUSCULAR | Status: DC | PRN
  Administered 2018-11-30: 100 mg via INTRAVENOUS

## 2018-11-30 MED ORDER — ONDANSETRON HCL 4 MG/2ML IJ SOLN
INTRAMUSCULAR | Status: DC | PRN
  Administered 2018-11-30: 4 mg via INTRAVENOUS

## 2018-11-30 MED ORDER — MIDAZOLAM HCL 5 MG/5ML IJ SOLN
INTRAMUSCULAR | Status: DC | PRN
  Administered 2018-11-30: 2 mg via INTRAVENOUS

## 2018-11-30 MED ORDER — CEFAZOLIN SODIUM-DEXTROSE 2-4 GM/100ML-% IV SOLN
2.0000 g | INTRAVENOUS | Status: AC
  Administered 2018-11-30: 2 g via INTRAVENOUS

## 2018-11-30 MED ORDER — FENTANYL CITRATE (PF) 250 MCG/5ML IJ SOLN
INTRAMUSCULAR | Status: DC | PRN
  Administered 2018-11-30 (×4): 25 ug via INTRAVENOUS

## 2018-11-30 MED ORDER — MIDAZOLAM HCL 2 MG/2ML IJ SOLN: 1.0000 mg | INTRAMUSCULAR | Status: DC | PRN

## 2018-11-30 MED ORDER — PROPOFOL 10 MG/ML IV BOLUS
INTRAVENOUS | Status: AC
  Filled 2018-11-30: qty 20

## 2018-11-30 MED ORDER — LIDOCAINE-EPINEPHRINE 1 %-1:100000 IJ SOLN
INTRAMUSCULAR | Status: DC | PRN
  Administered 2018-11-30: 3 mL

## 2018-11-30 MED ORDER — TOBRAMYCIN 0.3 % OP OINT
TOPICAL_OINTMENT | OPHTHALMIC | Status: DC | PRN
  Administered 2018-11-30: 1 via OPHTHALMIC

## 2018-11-30 MED ORDER — METOCLOPRAMIDE HCL 5 MG/ML IJ SOLN: 10.0000 mg | Freq: Once | INTRAMUSCULAR | Status: DC | PRN

## 2018-11-30 MED ORDER — ACETAMINOPHEN 650 MG RE SUPP: 650.0000 mg | RECTAL | Status: DC | PRN

## 2018-11-30 MED ORDER — ROCURONIUM BROMIDE 50 MG/5ML IV SOSY
PREFILLED_SYRINGE | INTRAVENOUS | Status: AC
  Filled 2018-11-30: qty 10

## 2018-11-30 SURGICAL SUPPLY — 48 items
ADH SKN CLS APL DERMABOND .7 (GAUZE/BANDAGES/DRESSINGS)
APL SRG 3 HI ABS STRL LF PLS (MISCELLANEOUS)
APL SWBSTK 6 STRL LF DISP (MISCELLANEOUS) ×1
APPLICATOR COTTON TIP 6 STRL (MISCELLANEOUS) IMPLANT
APPLICATOR COTTON TIP 6IN STRL (MISCELLANEOUS) ×3
APPLICATOR DR MATTHEWS STRL (MISCELLANEOUS) IMPLANT
BLADE SURG 15 STRL LF DISP TIS (BLADE) ×2 IMPLANT
BLADE SURG 15 STRL SS (BLADE) ×3
BNDG EYE OVAL (GAUZE/BANDAGES/DRESSINGS) ×6 IMPLANT
CLOSURE WOUND 1/2 X4 (GAUZE/BANDAGES/DRESSINGS) ×1
CORD BIPOLAR FORCEPS 12FT (ELECTRODE) ×3 IMPLANT
COVER BACK TABLE 60X90IN (DRAPES) ×3 IMPLANT
COVER MAYO STAND STRL (DRAPES) ×3 IMPLANT
COVER WAND RF STERILE (DRAPES) IMPLANT
DECANTER SPIKE VIAL GLASS SM (MISCELLANEOUS) IMPLANT
DERMABOND ADVANCED (GAUZE/BANDAGES/DRESSINGS)
DERMABOND ADVANCED .7 DNX12 (GAUZE/BANDAGES/DRESSINGS) IMPLANT
DRAPE U-SHAPE 76X120 STRL (DRAPES) ×3 IMPLANT
ELECT NDL BLADE 2-5/6 (NEEDLE) IMPLANT
ELECT NEEDLE BLADE 2-5/6 (NEEDLE) IMPLANT
ELECT REM PT RETURN 9FT ADLT (ELECTROSURGICAL) ×3
ELECTRODE REM PT RTRN 9FT ADLT (ELECTROSURGICAL) ×1 IMPLANT
GLOVE BIO SURGEON STRL SZ 6.5 (GLOVE) ×5 IMPLANT
GLOVE BIO SURGEON STRL SZ7 (GLOVE) ×2 IMPLANT
GLOVE BIO SURGEONS STRL SZ 6.5 (GLOVE) ×3
GLOVE BIOGEL PI IND STRL 7.0 (GLOVE) IMPLANT
GLOVE BIOGEL PI IND STRL 7.5 (GLOVE) ×1 IMPLANT
GLOVE BIOGEL PI INDICATOR 7.0 (GLOVE) ×2
GLOVE BIOGEL PI INDICATOR 7.5 (GLOVE) ×2
GOWN STRL REUS W/ TWL LRG LVL3 (GOWN DISPOSABLE) ×2 IMPLANT
GOWN STRL REUS W/ TWL XL LVL3 (GOWN DISPOSABLE) ×2 IMPLANT
GOWN STRL REUS W/TWL LRG LVL3 (GOWN DISPOSABLE) ×3
GOWN STRL REUS W/TWL XL LVL3 (GOWN DISPOSABLE) ×6
NDL PRECISIONGLIDE 27X1.5 (NEEDLE) IMPLANT
NEEDLE HYPO 30GX1 BEV (NEEDLE) ×3 IMPLANT
NEEDLE PRECISIONGLIDE 27X1.5 (NEEDLE) IMPLANT
PACK BASIN DAY SURGERY FS (CUSTOM PROCEDURE TRAY) ×3 IMPLANT
PENCIL BUTTON HOLSTER BLD 10FT (ELECTRODE) IMPLANT
SHIELD EYE LENSE ONLY DISP (GAUZE/BANDAGES/DRESSINGS) ×3 IMPLANT
SLEEVE SCD COMPRESS KNEE MED (MISCELLANEOUS) ×2 IMPLANT
STRIP CLOSURE SKIN 1/2X4 (GAUZE/BANDAGES/DRESSINGS) ×2 IMPLANT
STRIP SUTURE WOUND CLOSURE 1/2 (SUTURE) IMPLANT
SUT MNCRL 6-0 UNDY P1 1X18 (SUTURE) IMPLANT
SUT MONOCRYL 6-0 P1 1X18 (SUTURE) ×2
SUT PROLENE 6 0 P 1 18 (SUTURE) IMPLANT
SYR CONTROL 10ML LL (SYRINGE) ×2 IMPLANT
TOWEL GREEN STERILE FF (TOWEL DISPOSABLE) ×6 IMPLANT
TRAY DSU PREP LF (CUSTOM PROCEDURE TRAY) ×3 IMPLANT

## 2018-11-30 NOTE — Discharge Instructions (Addendum)
Head elevated as able. No heavy lifting. No strenuous activity. Lots of water to drink Up moving legs and walking. Ice to eye lids today.      Post Anesthesia Home Care Instructions  Activity: Get plenty of rest for the remainder of the day. A responsible individual must stay with you for 24 hours following the procedure.  For the next 24 hours, DO NOT: -Drive a car -Paediatric nurse -Drink alcoholic beverages -Take any medication unless instructed by your physician -Make any legal decisions or sign important papers.  Meals: Start with liquid foods such as gelatin or soup. Progress to regular foods as tolerated. Avoid greasy, spicy, heavy foods. If nausea and/or vomiting occur, drink only clear liquids until the nausea and/or vomiting subsides. Call your physician if vomiting continues.  Special Instructions/Symptoms: Your throat may feel dry or sore from the anesthesia or the breathing tube placed in your throat during surgery. If this causes discomfort, gargle with warm salt water. The discomfort should disappear within 24 hours.  If you had a scopolamine patch placed behind your ear for the management of post- operative nausea and/or vomiting:  1. The medication in the patch is effective for 72 hours, after which it should be removed.  Wrap patch in a tissue and discard in the trash. Wash hands thoroughly with soap and water. 2. You may remove the patch earlier than 72 hours if you experience unpleasant side effects which may include dry mouth, dizziness or visual disturbances. 3. Avoid touching the patch. Wash your hands with soap and water after contact with the patch.

## 2018-11-30 NOTE — Anesthesia Procedure Notes (Signed)
Procedure Name: LMA Insertion Date/Time: 11/30/2018 7:25 AM Performed by: Myna Bright, CRNA Pre-anesthesia Checklist: Patient identified, Emergency Drugs available, Suction available and Patient being monitored Patient Re-evaluated:Patient Re-evaluated prior to induction Oxygen Delivery Method: Circle system utilized Preoxygenation: Pre-oxygenation with 100% oxygen Induction Type: IV induction Ventilation: Mask ventilation without difficulty LMA: LMA inserted LMA Size: 4.0 Tube type: Oral Number of attempts: 1 Placement Confirmation: positive ETCO2 and breath sounds checked- equal and bilateral Tube secured with: Tape Dental Injury: Teeth and Oropharynx as per pre-operative assessment

## 2018-11-30 NOTE — Transfer of Care (Signed)
Immediate Anesthesia Transfer of Care Note  Patient: Tina Bird  Procedure(s) Performed: BLEPHAROPLASTY (Bilateral Eye)  Patient Location: PACU  Anesthesia Type:General  Level of Consciousness: awake, alert , oriented and patient cooperative  Airway & Oxygen Therapy: Patient Spontanous Breathing and Patient connected to nasal cannula oxygen  Post-op Assessment: Report given to RN, Post -op Vital signs reviewed and stable and Patient moving all extremities  Post vital signs: Reviewed and stable  Last Vitals:  Vitals Value Taken Time  BP    Temp    Pulse    Resp    SpO2      Last Pain:  Vitals:   11/30/18 0923  TempSrc: Oral  PainSc: 1       Patients Stated Pain Goal: 1 (59/74/71 8550)  Complications: No apparent anesthesia complications

## 2018-11-30 NOTE — Anesthesia Preprocedure Evaluation (Addendum)
Anesthesia Evaluation  Patient identified by MRN, date of birth, ID band Patient awake    Reviewed: Allergy & Precautions, NPO status , Patient's Chart, lab work & pertinent test results  Airway Mallampati: II  TM Distance: >3 FB Neck ROM: Full    Dental no notable dental hx.    Pulmonary former smoker,    Pulmonary exam normal breath sounds clear to auscultation       Cardiovascular hypertension, Pt. on medications negative cardio ROS Normal cardiovascular exam Rhythm:Regular Rate:Normal     Neuro/Psych negative neurological ROS  negative psych ROS   GI/Hepatic negative GI ROS, Neg liver ROS,   Endo/Other  negative endocrine ROS  Renal/GU negative Renal ROS  negative genitourinary   Musculoskeletal negative musculoskeletal ROS (+)   Abdominal   Peds negative pediatric ROS (+)  Hematology negative hematology ROS (+)   Anesthesia Other Findings   Reproductive/Obstetrics negative OB ROS                            Anesthesia Physical Anesthesia Plan  ASA: II  Anesthesia Plan: General   Post-op Pain Management:    Induction: Intravenous  PONV Risk Score and Plan: 3 and Ondansetron, Dexamethasone and Treatment may vary due to age or medical condition  Airway Management Planned: LMA and Oral ETT  Additional Equipment:   Intra-op Plan:   Post-operative Plan: Extubation in OR  Informed Consent: I have reviewed the patients History and Physical, chart, labs and discussed the procedure including the risks, benefits and alternatives for the proposed anesthesia with the patient or authorized representative who has indicated his/her understanding and acceptance.     Dental advisory given  Plan Discussed with: CRNA  Anesthesia Plan Comments:         Anesthesia Quick Evaluation

## 2018-11-30 NOTE — Op Note (Signed)
Operative Note  Date of operation: 11/30/2018  Patient: Tina Bird, MRN: [id number], 68 y.o. female.  Date of birth:  Nov 30, 1950  Location: [place]  Preoperative Diagnosis: Dermatochalasis  Postoperative Diagnosis: Same  Procedure: Bilateral Upper eyelid blepharoplasty  Surgeon: Theodoro Kos Jaquasia Doscher, DO  Anesthesia: 1% lidocaine with epinepherine  Condition: Stable  Complications: None   Indications: To prevent complications and improve visual field function.  Improve quality of life.  The patient presented with concerns regarding heavy upper eyelids with a decrease visual field.  This made it difficult with activities and reading.  It was better with manual elevation of the upper eyelids or extending the neck.  The clinical exam and tests confirmed the need for bilateral upper eyelid blepharoplasty.  Surgical risks and benefits were discussed in detail and are in the chart.  Procedure in detail: Patient was seen on the morning of the procedure after anesthesia evaluation. The patient was marked in holding in the sitting and reclining position. All questions were answered.  The patient was then taken to the operating room.  Anesthesia was administered and a time out was called with all information confirmed to be correct.  The patient was prepped and draped with a betadine prep. The markings were confirmed.  Local anesthetic consisting of 1% lidocaine with 1:100,000 units of epinephrine was injected into the upper lids. The local was given 6 minutes to take effect.  Right upper lid.  The redundant upper eyelid was measured with 20 mm of upper eyelid skin to remain.  A # 15 blade was used to incise the upper eyelid skin.  A small strip of orbicularis muscle was taken at the crease.  Hemostasis was achieved with bipolar.  The dissection was carried down past the orbicularis and expose the fat.  A small amount of fat was cauterized in the medial compartment of the  upper lid. The incisions were closed with 6-0 Monocryl as a running subcuticular and several interrupted sutures placed laterally.  Left upper lid.  The redundant upper eyelid was measured with 20 mm of upper eyelid skin to remain.  A # 15 blade was used to incise the upper eyelid skin.  A small strip of orbicularis muscle was taken at the crease.  Hemostasis was achieved with bipolar.  The dissection was carried down past the orbicularis and expose the fat.  A small amount of fat was cauterized in the medial compartment of the upper lid. The incisions were closed with 6-0 Monocryl as a running subcuticular and several interrupted sutures placed laterally.  The incisions were washed off. TobraDex was applied to each eye. Ice was applied and patient was allowed to wake up and was taken to the recovery room with no apparent complications. Family was notified at the end of the case.

## 2018-11-30 NOTE — Interval H&P Note (Signed)
History and Physical Interval Note:  11/30/2018 6:23 AM  Tina Bird  has presented today for surgery, with the diagnosis of excess skin of eyelid, bilateral  The various methods of treatment have been discussed with the patient and family. After consideration of risks, benefits and other options for treatment, the patient has consented to  Procedure(s) with comments: BLEPHAROPLASTY (Bilateral) - 90 min surgery time - per provider as a surgical intervention .  The patient's history has been reviewed, patient examined, no change in status, stable for surgery.  I have reviewed the patient's chart and labs.  Questions were answered to the patient's satisfaction.     Loel Lofty Videl Nobrega

## 2018-11-30 NOTE — Anesthesia Postprocedure Evaluation (Signed)
Anesthesia Post Note  Patient: Tina Bird  Procedure(s) Performed: BLEPHAROPLASTY (Bilateral Eye)     Patient location during evaluation: PACU Anesthesia Type: General Level of consciousness: awake and alert Pain management: pain level controlled Vital Signs Assessment: post-procedure vital signs reviewed and stable Respiratory status: spontaneous breathing, nonlabored ventilation, respiratory function stable and patient connected to nasal cannula oxygen Cardiovascular status: blood pressure returned to baseline and stable Postop Assessment: no apparent nausea or vomiting Anesthetic complications: no    Last Vitals:  Vitals:   11/30/18 0849 11/30/18 0923  BP: (!) 141/79 (!) 146/79  Pulse: 80 74  Resp: 13 16  Temp:  36.6 C  SpO2: 97% 97%    Last Pain:  Vitals:   11/30/18 0923  TempSrc: Oral  PainSc: 1                  Montez Hageman

## 2018-12-01 ENCOUNTER — Telehealth: Payer: Self-pay

## 2018-12-01 ENCOUNTER — Encounter (HOSPITAL_BASED_OUTPATIENT_CLINIC_OR_DEPARTMENT_OTHER): Payer: Self-pay | Admitting: Plastic Surgery

## 2018-12-01 ENCOUNTER — Telehealth: Payer: Self-pay | Admitting: Plastic Surgery

## 2018-12-01 NOTE — Telephone Encounter (Signed)
Patient called with questions about bleph procedure performed earlier this week

## 2018-12-01 NOTE — Telephone Encounter (Signed)
Call back to pt to address her questions regarding her postop instructions & wound care She is c/o slight blurry vision & increased swelling (R) > (L), as well as "scratchy" eyes She is asking if she can use OTC Visine & wants to know where & when to use the Tobradex ointment  She also wants verification about taking shower  I instructed her as per Dr. Eusebio Friendly advice as follows: She can shower- but avoid direct water pressure on eyes & use mild soap  She should not use visine, but she can use OTC- "Refresh" or other rewetting drops Use Tobradex once daily on incision  Pt understands & agrees with care- she will call for any concerns  [place], RNFA

## 2018-12-08 ENCOUNTER — Ambulatory Visit (INDEPENDENT_AMBULATORY_CARE_PROVIDER_SITE_OTHER): Payer: Medicare Other | Admitting: Plastic Surgery

## 2018-12-08 ENCOUNTER — Encounter: Payer: Self-pay | Admitting: Plastic Surgery

## 2018-12-08 VITALS — BP 126/76 | HR 87 | Temp 98.5°F | Ht 65.0 in | Wt 154.0 lb

## 2018-12-08 DIAGNOSIS — H02834 Dermatochalasis of left upper eyelid: Secondary | ICD-10-CM

## 2018-12-08 DIAGNOSIS — H02831 Dermatochalasis of right upper eyelid: Secondary | ICD-10-CM

## 2018-12-08 NOTE — Progress Notes (Signed)
The patient is a 68 year old female here for follow-up on upper lid blepharoplasty.  She is bruised.  She has a little bit of swelling.  Incisions are clean and healing nicely.  There is no sign of infection.  Sutures are in place.  We remove the sutures today.  She can start with light massage in a week.

## 2018-12-15 ENCOUNTER — Telehealth: Payer: Self-pay | Admitting: Plastic Surgery

## 2018-12-15 NOTE — Telephone Encounter (Signed)
Patient called and left voicemail on after hours line. Patient states that she feels that there is a stitch that is sore around the surgery site. Please call and advise

## 2018-12-19 NOTE — Telephone Encounter (Signed)
Call back to regarding her concern with her upper bleph incision on right eye. She c/o a "possible stitch" reaction at the inner corner of the right eye She denies any drainage, no bleeding, minimal bruising. Pt removed a piece of stitch & the irritation has now resolved  She will call for any further concerns [facility]

## 2018-12-21 ENCOUNTER — Ambulatory Visit (INDEPENDENT_AMBULATORY_CARE_PROVIDER_SITE_OTHER): Payer: Medicare Other | Admitting: Plastic Surgery

## 2018-12-21 ENCOUNTER — Encounter: Payer: Self-pay | Admitting: Plastic Surgery

## 2018-12-21 ENCOUNTER — Other Ambulatory Visit: Payer: Self-pay

## 2018-12-21 VITALS — BP 135/83 | HR 80 | Temp 98.4°F | Ht 65.0 in | Wt 154.0 lb

## 2018-12-21 DIAGNOSIS — H02831 Dermatochalasis of right upper eyelid: Secondary | ICD-10-CM

## 2018-12-21 DIAGNOSIS — Z719 Counseling, unspecified: Secondary | ICD-10-CM

## 2018-12-21 DIAGNOSIS — H02834 Dermatochalasis of left upper eyelid: Secondary | ICD-10-CM

## 2018-12-21 NOTE — Progress Notes (Signed)
   Subjective:    Patient ID: Tina Bird, female    DOB: 1951-09-22, 68 y.o.   MRN: [id number]  The patient is a 68 year old female here for follow-up after her upper lid blepharoplasty.  She is very pleased with the results and doing well.  No sign of infection. Incisions healing well.   Review of Systems  Constitutional: Negative.   HENT: Negative.   Respiratory: Negative.   Cardiovascular: Negative.   Gastrointestinal: Negative.   Skin: Negative.   Psychiatric/Behavioral: Negative.        Objective:   Physical Exam Vitals signs and nursing note reviewed.  Constitutional:      Appearance: Normal appearance.  HENT:     Head: Normocephalic.  Cardiovascular:     Rate and Rhythm: Normal rate.  Pulmonary:     Effort: Pulmonary effort is normal.  Neurological:     General: No focal deficit present.     Mental Status: She is alert.  Psychiatric:        Mood and Affect: Mood normal.        Behavior: Behavior normal.        Assessment & Plan:  Dermatochalasis of both upper eyelids  Recommend eye cream and neck cream from [facility]. Light massage to the incision sites with clean hands. Follow up as needed.

## 2018-12-22 ENCOUNTER — Ambulatory Visit: Payer: Medicare Other | Admitting: Plastic Surgery

## 2018-12-28 ENCOUNTER — Ambulatory Visit: Payer: Medicare Other | Admitting: Internal Medicine

## 2019-01-22 ENCOUNTER — Telehealth: Payer: Self-pay | Admitting: Internal Medicine

## 2019-01-22 NOTE — Telephone Encounter (Addendum)
Noted. I'm sorry! Thx 

## 2019-01-22 NOTE — Telephone Encounter (Signed)
Patient refused to set up a virtual visit or any kind of visit. She just wanted a RX. Patient states a nurse needs to tell her that, no someone who answers phones, then she hung up.

## 2019-01-22 NOTE — Telephone Encounter (Signed)
Patient called and left a voicemail in order to try and get something prescribed for poison ivy. Call back [phone] (mobile)

## 2019-01-25 ENCOUNTER — Ambulatory Visit (INDEPENDENT_AMBULATORY_CARE_PROVIDER_SITE_OTHER): Payer: Medicare Other | Admitting: Internal Medicine

## 2019-01-25 ENCOUNTER — Encounter: Payer: Self-pay | Admitting: Internal Medicine

## 2019-01-25 DIAGNOSIS — M810 Age-related osteoporosis without current pathological fracture: Secondary | ICD-10-CM

## 2019-01-25 DIAGNOSIS — L259 Unspecified contact dermatitis, unspecified cause: Secondary | ICD-10-CM | POA: Diagnosis not present

## 2019-01-25 DIAGNOSIS — M8580 Other specified disorders of bone density and structure, unspecified site: Secondary | ICD-10-CM | POA: Insufficient documentation

## 2019-01-25 MED ORDER — HYDROXYZINE HCL 25 MG PO TABS: 25.0000 mg | ORAL_TABLET | Freq: Four times a day (QID) | ORAL | 1 refills | Status: DC | PRN

## 2019-01-25 MED ORDER — METHYLPREDNISOLONE 4 MG PO TBPK: ORAL_TABLET | ORAL | 0 refills | Status: DC

## 2019-01-25 MED ORDER — TRIAMCINOLONE ACETONIDE 0.1 % EX CREA: 1.0000 "application " | TOPICAL_CREAM | Freq: Four times a day (QID) | CUTANEOUS | 1 refills | Status: DC

## 2019-01-25 NOTE — Progress Notes (Signed)
Virtual Visit via Telephone Note  I connected with Tina Bird on 01/25/19 at 10:20 AM EDT by telephone and verified that I am speaking with the correct person using two identifiers.   I discussed the limitations, risks, security and privacy concerns of performing an evaluation and management service by telephone and the availability of in person appointments. I also discussed with the patient that there may be a patient responsible charge related to this service. The patient expressed understanding and agreed to proceed.   History of Present Illness:   The patient is complaining of poison ivy rash to 3 days duration.  It is getting worse.  She tried calamine lotion, alcohol, bleach with no success.  She has a history of poison ivy dermatitis in the past.  Oral prednisone made her hyper.  She is also complaining of itching. Baby is asking about the results of her bone density scan Observations/Objective:  The patient is in no acute distress.  I reviewed the connection has interrupted early.  I was unable to see the rash Assessment and Plan:  See plan Follow Up Instructions:    I discussed the assessment and treatment plan with the patient. The patient was provided an opportunity to ask questions and all were answered. The patient agreed with the plan and demonstrated an understanding of the instructions.   The patient was advised to call back or seek an in-person evaluation if the symptoms worsen or if the condition fails to improve as anticipated.  I provided 20 minutes of non-face-to-face time during this encounter.   Walker Kehr, MD

## 2019-01-25 NOTE — Addendum Note (Signed)
Addended by: Cassandria Anger on: 01/25/2019 12:32 PM   Modules accepted: Orders

## 2019-01-25 NOTE — Telephone Encounter (Signed)
Patient has called back and set up the virtual visit for the poison ivy today 4/23.

## 2019-01-25 NOTE — Assessment & Plan Note (Signed)
Options discussed.  The patient will read about Prolia.  We will put Prolia through insurance preapproval process if baby decides to have it.

## 2019-01-25 NOTE — Assessment & Plan Note (Signed)
Poison ivy dermatitis.  Medrol Dosepak.  Triamcinolone cream.  Calamine lotion.  Hydroxyzine as needed.  Wash gardening clothes, gloves.  Wipe off gardening tools with baby wipes

## 2019-02-09 ENCOUNTER — Telehealth: Payer: Self-pay

## 2019-02-09 NOTE — Telephone Encounter (Signed)
Copied from [place] #[id number]. Topic: General - Inquiry >> Feb 09, 2019 11:17 AM Reyne Dumas L wrote: Reason for CRM:   Pt was calling to schedule prolia injection.  Routing to [place]/sched---do you have any kind of request for this patient--dr plotnikov not here to ask, but I cant find where we've verified insurance or have a request to begin prolia--can you please check your paperwork to see?  I will get with you on Tuesday 02/13/19 and call patient back, thanks

## 2019-02-12 NOTE — Telephone Encounter (Signed)
I do not have any orders for this patient but will go ahead and work on submitting for benefits. Will need orders from Dr Alain Marion.

## 2019-02-13 ENCOUNTER — Telehealth: Payer: Self-pay

## 2019-02-13 NOTE — Telephone Encounter (Signed)
I have talked with patient---and I do see where video office visit with dr plotnikov had prolia discussion---I will need to get verbal ok order from dr plotnikov for starting prolia (dr plotnikov on vacation this week) when he returns, we are verifying insurance while we wait for doctor order and I will call patient back to discuss summary of benefits in about one to two weeks, ok to schedule at patient's earliest convenience after that, I have explained how we are giving shots in parking lot, copay to be billed to patient---can call back and talk with tamara,RN at [place] office if any further questions

## 2019-02-13 NOTE — Telephone Encounter (Signed)
error 

## 2019-02-19 NOTE — Telephone Encounter (Signed)
Routing to [place]/sched, ok to start prolia---I think you are already running benefits check, thanks

## 2019-02-19 NOTE — Telephone Encounter (Signed)
Per dr plotnikov---ok to start prolia---we are running insurance benefits right now,i will call patient back for summary of benefits conversation--calclum labs were done on feb/2020, lab values are normal

## 2019-02-21 NOTE — Telephone Encounter (Signed)
Information has been submitted for approval.  Waiting on benefits.

## 2019-02-28 ENCOUNTER — Ambulatory Visit: Payer: Medicare Other | Admitting: Internal Medicine

## 2019-03-09 ENCOUNTER — Telehealth: Payer: Self-pay | Admitting: Plastic Surgery

## 2019-03-09 NOTE — Telephone Encounter (Signed)
Received call from patient requesting call back from clinical staff. She had a blepharoplasty on 11/30/2018 and states that the scarring is still bumpy and has not completely improved. She states that her eyeshadow does not apply as well as she expected. She would like advice on next steps.

## 2019-03-12 DIAGNOSIS — M9902 Segmental and somatic dysfunction of thoracic region: Secondary | ICD-10-CM | POA: Diagnosis not present

## 2019-03-12 DIAGNOSIS — M5136 Other intervertebral disc degeneration, lumbar region: Secondary | ICD-10-CM | POA: Diagnosis not present

## 2019-03-12 DIAGNOSIS — M5134 Other intervertebral disc degeneration, thoracic region: Secondary | ICD-10-CM | POA: Diagnosis not present

## 2019-03-12 DIAGNOSIS — M9905 Segmental and somatic dysfunction of pelvic region: Secondary | ICD-10-CM | POA: Diagnosis not present

## 2019-03-12 DIAGNOSIS — M9903 Segmental and somatic dysfunction of lumbar region: Secondary | ICD-10-CM | POA: Diagnosis not present

## 2019-03-12 NOTE — Telephone Encounter (Signed)
Patient calling in to check on status of getting this injection. Please advise and call back.

## 2019-03-12 NOTE — Telephone Encounter (Signed)
Called and spoke with the patient and informed her that I spoke with Dr. Marla Roe regarding her message, and she would like for her to massage the area and then come and see her in 2 weeks.  Informed the patient that she can give [place] a call back to schedule that appointment for 2 weeks.  Patient verbalized understanding and agreed.//AB/CMA

## 2019-03-13 NOTE — Telephone Encounter (Signed)
Waiting on benefits from  [place]. Will follow up with patient tomorrow.

## 2019-03-14 NOTE — Telephone Encounter (Signed)
Prolia has been verifired. Patient is responsible for a $265 copay.  Left message for patient to call back to schedule. This will be given in the car so this copay will be billed to the patient.

## 2019-03-19 NOTE — Telephone Encounter (Signed)
Patient called today stating that she did not believe she had a copay.  Please follow up with in regard.

## 2019-03-19 NOTE — Telephone Encounter (Signed)
Called patient and explained "copay". She is going to check with [employer] for assistance and will follow back up with [place] to schedule.

## 2019-03-23 NOTE — Telephone Encounter (Signed)
Pt called and she got approved for the [employer] but is waiting for the money she also checked with [facility] and they said she should not pay more than $95 out of pocket and wanted to know if it is being coded and put in as post menopausal? I told patient she would get a call back next week

## 2019-03-26 NOTE — Telephone Encounter (Signed)
Patient is not approved thru [employer], however [employer] did advise that another funding would soon be available, they have submitted her request to that patient assistance funding---but patient thinks she wants to go ahead and get shot now, understanding that her copay may be as much as estimated $265 (minus deductible if already met)---she is calling her insurance company again to verify copay---she will call back to schedule nurse visit (to be done in parking lot)----can talk with Hester Joslin,RN at [place] office if any further questions

## 2019-03-27 ENCOUNTER — Ambulatory Visit: Payer: Medicare Other

## 2019-04-13 ENCOUNTER — Other Ambulatory Visit: Payer: Self-pay | Admitting: Internal Medicine

## 2019-04-30 ENCOUNTER — Ambulatory Visit (INDEPENDENT_AMBULATORY_CARE_PROVIDER_SITE_OTHER): Payer: Medicare Other | Admitting: Internal Medicine

## 2019-04-30 ENCOUNTER — Other Ambulatory Visit: Payer: Self-pay

## 2019-04-30 ENCOUNTER — Encounter: Payer: Self-pay | Admitting: Internal Medicine

## 2019-04-30 VITALS — BP 142/80 | HR 90 | Temp 98.7°F | Ht 65.0 in | Wt 155.0 lb

## 2019-04-30 DIAGNOSIS — E785 Hyperlipidemia, unspecified: Secondary | ICD-10-CM

## 2019-04-30 DIAGNOSIS — Z0000 Encounter for general adult medical examination without abnormal findings: Secondary | ICD-10-CM

## 2019-04-30 DIAGNOSIS — M8580 Other specified disorders of bone density and structure, unspecified site: Secondary | ICD-10-CM

## 2019-04-30 NOTE — Progress Notes (Signed)
Subjective:  Patient ID: Tina Bird, female    DOB: [birth year]  Age: 68 y.o. MRN: [id number]  CC: Annual Exam   HPI Tina Bird presents for a well exam F/u osteopenia, dyslipidemia  Outpatient Medications Prior to Visit  Medication Sig Dispense Refill  . aspirin EC 81 MG tablet Take 81 mg by mouth daily.    . Cholecalciferol 1000 UNITS tablet Take 1,000 Units by mouth daily.      [name] escitalopram (LEXAPRO) 5 MG tablet Take 1 tablet (5 mg total) by mouth daily. 90 tablet 3  . hydrOXYzine (ATARAX/VISTARIL) 25 MG tablet Take 1-2 tablets (25-50 mg total) by mouth every 6 (six) hours as needed for itching. 60 tablet 1  . loratadine (CLARITIN) 10 MG tablet Take 10 mg by mouth daily.    [name] omeprazole (PRILOSEC) 40 MG capsule TAKE 1 CAPSULE(40 MG) BY MOUTH DAILY 90 capsule 2  . triamcinolone cream (KENALOG) 0.1 % Apply 1 application topically 4 (four) times daily. 450 g 1  . triamterene-hydrochlorothiazide (MAXZIDE-25) 37.5-25 MG tablet Take 0.5 tablets by mouth daily. 45 tablet 3  . vitamin B-12 (CYANOCOBALAMIN) 1000 MCG tablet Take 1,000 mcg by mouth daily.    . methylPREDNISolone (MEDROL DOSEPAK) 4 MG TBPK tablet As directed (Patient not taking: Reported on 04/30/2019) 21 tablet 0   No facility-administered medications prior to visit.     ROS: Review of Systems  Constitutional: Negative for activity change, appetite change, chills, fatigue and unexpected weight change.  HENT: Negative for congestion, mouth sores and sinus pressure.   Eyes: Negative for visual disturbance.  Respiratory: Negative for cough and chest tightness.   Gastrointestinal: Negative for abdominal pain and nausea.  Genitourinary: Negative for difficulty urinating, frequency and vaginal pain.  Musculoskeletal: Negative for back pain and gait problem.  Skin: Negative for pallor and rash.  Neurological: Negative for dizziness, tremors, weakness, numbness and headaches.  Psychiatric/Behavioral:  Negative for confusion, sleep disturbance and suicidal ideas.    Objective:  BP (!) 142/80 (BP Location: Left Arm, Patient Position: Sitting, Cuff Size: Normal)   Pulse 90   Temp 98.7 F (37.1 C) (Oral)   Ht 5\' 5"  (1.651 m)   Wt 155 lb (70.3 kg)   SpO2 96%   BMI 25.79 kg/m   BP Readings from Last 3 Encounters:  04/30/19 (!) 142/80  12/21/18 135/83  12/08/18 126/76    Wt Readings from Last 3 Encounters:  04/30/19 155 lb (70.3 kg)  12/21/18 154 lb (69.9 kg)  12/08/18 154 lb (69.9 kg)    Physical Exam Constitutional:      General: She is not in acute distress.    Appearance: She is well-developed.  HENT:     Head: Normocephalic.     Right Ear: External ear normal.     Left Ear: External ear normal.     Nose: Nose normal.  Eyes:     General:        Right eye: No discharge.        Left eye: No discharge.     Conjunctiva/sclera: Conjunctivae normal.     Pupils: Pupils are equal, round, and reactive to light.  Neck:     Musculoskeletal: Normal range of motion and neck supple.     Thyroid: No thyromegaly.     Vascular: No JVD.     Trachea: No tracheal deviation.  Cardiovascular:     Rate and Rhythm: Normal rate and regular rhythm.     Heart sounds: Normal  heart sounds.  Pulmonary:     Effort: No respiratory distress.     Breath sounds: No stridor. No wheezing.  Abdominal:     General: Bowel sounds are normal. There is no distension.     Palpations: Abdomen is soft. There is no mass.     Tenderness: There is no abdominal tenderness. There is no guarding or rebound.  Musculoskeletal:        General: No tenderness.  Lymphadenopathy:     Cervical: No cervical adenopathy.  Skin:    Findings: No erythema or rash.  Neurological:     Cranial Nerves: No cranial nerve deficit.     Motor: No abnormal muscle tone.     Coordination: Coordination normal.     Deep Tendon Reflexes: Reflexes normal.  Psychiatric:        Behavior: Behavior normal.        Thought Content:  Thought content normal.        Judgment: Judgment normal.   post-op bruises - s/p neck lift  Lab Results  Component Value Date   WBC 5.0 08/15/2017   HGB 13.2 08/15/2017   HCT 39.9 08/15/2017   PLT 234.0 08/15/2017   GLUCOSE 98 11/28/2018   CHOL 205 (H) 08/15/2017   TRIG 188.0 (H) 08/15/2017   HDL 35.40 (L) 08/15/2017   LDLDIRECT 138.5 11/13/2012   LDLCALC 132 (H) 08/15/2017   ALT 17 08/15/2017   AST 18 08/15/2017   NA 140 11/28/2018   K 3.6 11/28/2018   CL 100 11/28/2018   CREATININE 0.82 11/28/2018   BUN 14 11/28/2018   CO2 31 11/28/2018   TSH 2.55 08/15/2017   HGBA1C 6.0 12/07/2016    No results found.  Assessment & Plan:   There are no diagnoses linked to this encounter.   No orders of the defined types were placed in this encounter.    Follow-up: No follow-ups on file.  Walker Kehr, MD

## 2019-04-30 NOTE — Assessment & Plan Note (Signed)
We discussed age appropriate health related issues, including available/recomended screening tests and vaccinations. We discussed a need for adhering to healthy diet and exercise. Labs were ordered to be later reviewed . All questions were answered. Colon 2011 - Dr Carlean Purl, due next 2021 mammo q 1 year PAP - advised to schedule Singrix Rx CT ca score offered

## 2019-04-30 NOTE — Patient Instructions (Signed)
If you have medicare related insurance (such as traditional Medicare, Blue Cross Medicare, United HealthCare Medicare, or similar), Please make an appointment at the scheduling desk with Jill, the Wellness Health Coach, for your Wellness visit in this office, which is a benefit with your insurance.    Cardiac CT calcium scoring test $150   Computed tomography, more commonly known as a CT or CAT scan, is a diagnostic medical imaging test. Like traditional x-rays, it produces multiple images or pictures of the inside of the body. The cross-sectional images generated during a CT scan can be reformatted in multiple planes. They can even generate three-dimensional images. These images can be viewed on a computer monitor, printed on film or by a 3D printer, or transferred to a CD or DVD. CT images of internal organs, bones, soft tissue and blood vessels provide greater detail than traditional x-rays, particularly of soft tissues and blood vessels. A cardiac CT scan for coronary calcium is a non-invasive way of obtaining information about the presence, location and extent of calcified plaque in the coronary arteries-the vessels that supply oxygen-containing blood to the heart muscle. Calcified plaque results when there is a build-up of fat and other substances under the inner layer of the artery. This material can calcify which signals the presence of atherosclerosis, a disease of the vessel wall, also called coronary artery disease (CAD). People with this disease have an increased risk for heart attacks. In addition, over time, progression of plaque build up (CAD) can narrow the arteries or even close off blood flow to the heart. The result may be chest pain, sometimes called "angina," or a heart attack. Because calcium is a marker of CAD, the amount of calcium detected on a cardiac CT scan is a helpful prognostic tool. The findings on cardiac CT are expressed as a calcium score. Another name for this test is  coronary artery calcium scoring.  What are some common uses of the procedure? The goal of cardiac CT scan for calcium scoring is to determine if CAD is present and to what extent, even if there are no symptoms. It is a screening study that may be recommended by a physician for patients with risk factors for CAD but no clinical symptoms. The major risk factors for CAD are: . high blood cholesterol levels  . family history of heart attacks  . diabetes  . high blood pressure  . cigarette smoking  . overweight or obese  . physical inactivity   A negative cardiac CT scan for calcium scoring shows no calcification within the coronary arteries. This suggests that CAD is absent or so minimal it cannot be seen by this technique. The chance of having a heart attack over the next two to five years is very low under these circumstances. A positive test means that CAD is present, regardless of whether or not the patient is experiencing any symptoms. The amount of calcification-expressed as the calcium score-may help to predict the likelihood of a myocardial infarction (heart attack) in the coming years and helps your medical doctor or cardiologist decide whether the patient may need to take preventive medicine or undertake other measures such as diet and exercise to lower the risk for heart attack. The extent of CAD is graded according to your calcium score:  Calcium Score  Presence of CAD (coronary artery disease)  0 No evidence of CAD   1-10 Minimal evidence of CAD  11-100 Mild evidence of CAD  101-400 Moderate evidence of CAD  Over   400 Extensive evidence of CAD    

## 2019-04-30 NOTE — Assessment & Plan Note (Addendum)
Vit D Start yoga  Thinking of Prolia

## 2019-04-30 NOTE — Assessment & Plan Note (Signed)
cardiac CT scan for calcium scoring offered 

## 2019-05-07 ENCOUNTER — Other Ambulatory Visit (INDEPENDENT_AMBULATORY_CARE_PROVIDER_SITE_OTHER): Payer: Medicare Other

## 2019-05-07 DIAGNOSIS — Z0000 Encounter for general adult medical examination without abnormal findings: Secondary | ICD-10-CM

## 2019-05-07 DIAGNOSIS — E785 Hyperlipidemia, unspecified: Secondary | ICD-10-CM

## 2019-05-07 LAB — HEPATIC FUNCTION PANEL
ALT: 17 U/L (ref 0–35)
AST: 16 U/L (ref 0–37)
Albumin: 4.3 g/dL (ref 3.5–5.2)
Alkaline Phosphatase: 73 U/L (ref 39–117)
Bilirubin, Direct: 0.1 mg/dL (ref 0.0–0.3)
Total Bilirubin: 0.3 mg/dL (ref 0.2–1.2)
Total Protein: 6.9 g/dL (ref 6.0–8.3)

## 2019-05-07 LAB — URINALYSIS
Bilirubin Urine: NEGATIVE
Hgb urine dipstick: NEGATIVE
Ketones, ur: NEGATIVE
Leukocytes,Ua: NEGATIVE
Nitrite: NEGATIVE
Specific Gravity, Urine: 1.01 (ref 1.000–1.030)
Total Protein, Urine: NEGATIVE
Urine Glucose: NEGATIVE
Urobilinogen, UA: 0.2 (ref 0.0–1.0)
pH: 6 (ref 5.0–8.0)

## 2019-05-07 LAB — CBC WITH DIFFERENTIAL/PLATELET
Basophils Absolute: 0.1 10*3/uL (ref 0.0–0.1)
Basophils Relative: 1.2 % (ref 0.0–3.0)
Eosinophils Absolute: 0.5 10*3/uL (ref 0.0–0.7)
Eosinophils Relative: 10.7 % — ABNORMAL HIGH (ref 0.0–5.0)
HCT: 38.8 % (ref 36.0–46.0)
Hemoglobin: 12.9 g/dL (ref 12.0–15.0)
Lymphocytes Relative: 30.5 % (ref 12.0–46.0)
Lymphs Abs: 1.4 10*3/uL (ref 0.7–4.0)
MCHC: 33.2 g/dL (ref 30.0–36.0)
MCV: 88.1 fl (ref 78.0–100.0)
Monocytes Absolute: 0.2 10*3/uL (ref 0.1–1.0)
Monocytes Relative: 4.9 % (ref 3.0–12.0)
Neutro Abs: 2.4 10*3/uL (ref 1.4–7.7)
Neutrophils Relative %: 52.7 % (ref 43.0–77.0)
Platelets: 215 10*3/uL (ref 150.0–400.0)
RBC: 4.4 Mil/uL (ref 3.87–5.11)
RDW: 13.6 % (ref 11.5–15.5)
WBC: 4.6 10*3/uL (ref 4.0–10.5)

## 2019-05-07 LAB — BASIC METABOLIC PANEL
BUN: 15 mg/dL (ref 6–23)
CO2: 31 mEq/L (ref 19–32)
Calcium: 9.3 mg/dL (ref 8.4–10.5)
Chloride: 101 mEq/L (ref 96–112)
Creatinine, Ser: 0.87 mg/dL (ref 0.40–1.20)
GFR: 64.44 mL/min (ref >60.00)
Glucose, Bld: 77 mg/dL (ref 70–99)
Potassium: 3.5 mEq/L (ref 3.5–5.1)
Sodium: 140 mEq/L (ref 135–145)

## 2019-05-07 LAB — LIPID PANEL
Cholesterol: 195 mg/dL (ref 0–200)
HDL: 37.4 mg/dL — ABNORMAL LOW (ref >39.00)
NonHDL: 157.42
Total CHOL/HDL Ratio: 5
Triglycerides: 262 mg/dL — ABNORMAL HIGH (ref 0.0–149.0)
VLDL: 52.4 mg/dL — ABNORMAL HIGH (ref 0.0–40.0)

## 2019-05-07 LAB — TSH: TSH: 1.12 u[IU]/mL (ref 0.35–4.50)

## 2019-05-07 LAB — LDL CHOLESTEROL, DIRECT: Direct LDL: 119 mg/dL

## 2019-06-13 ENCOUNTER — Other Ambulatory Visit: Payer: Self-pay

## 2019-06-13 ENCOUNTER — Ambulatory Visit (INDEPENDENT_AMBULATORY_CARE_PROVIDER_SITE_OTHER)
Admission: RE | Admit: 2019-06-13 | Discharge: 2019-06-13 | Disposition: A | Discharge status: Home / Self Care | Payer: Self-pay | Source: Ambulatory Visit | Attending: Internal Medicine | Admitting: Internal Medicine

## 2019-06-13 DIAGNOSIS — E785 Hyperlipidemia, unspecified: Secondary | ICD-10-CM

## 2019-07-10 DIAGNOSIS — H353131 Nonexudative age-related macular degeneration, bilateral, early dry stage: Secondary | ICD-10-CM | POA: Diagnosis not present

## 2019-07-10 DIAGNOSIS — H04123 Dry eye syndrome of bilateral lacrimal glands: Secondary | ICD-10-CM | POA: Diagnosis not present

## 2019-07-10 DIAGNOSIS — H43811 Vitreous degeneration, right eye: Secondary | ICD-10-CM | POA: Diagnosis not present

## 2019-07-10 DIAGNOSIS — H02831 Dermatochalasis of right upper eyelid: Secondary | ICD-10-CM | POA: Diagnosis not present

## 2019-07-10 DIAGNOSIS — H25813 Combined forms of age-related cataract, bilateral: Secondary | ICD-10-CM | POA: Diagnosis not present

## 2019-08-17 ENCOUNTER — Ambulatory Visit: Payer: Medicare Other

## 2019-08-17 NOTE — Progress Notes (Unsigned)
Subjective:   Tina Bird is a 68 y.o. female who presents for Medicare Annual (Subsequent) preventive examination. I connected with patient by a telephone and verified that I am speaking with the correct person using two identifiers. Patient stated full name and DOB. Patient gave permission to continue with telephonic visit. Patient's location was at home and Nurse's location was at [place] office. Participants during this visit included patient and nurse.  Review of Systems:     Sleep patterns: {SX; SLEEP PATTERNS:18802::"feels rested on waking","does not get up to void","gets up *** times nightly to void","sleeps *** hours nightly"}.    Home Safety/Smoke Alarms: Feels safe in home. Smoke alarms in place.  Living environment; residence and Firearm Safety: {Rehab home environment / accessibility:30080::"no firearms","firearms stored safely"}. Seat Belt Safety/Bike Helmet: Wears seat belt.     Objective:     Vitals: There were no vitals taken for this visit.  There is no height or weight on file to calculate BMI.  Advanced Directives 11/23/2018 08/09/2018 04/14/2017 01/25/2016 09/06/2012  Does Patient Have a Medical Advance Directive? No No No No Patient does not have advance directive  Would patient like information on creating a medical advance directive? - Yes (ED - Information included in AVS) No - Patient declined - -  Pre-existing out of facility DNR order (yellow form or pink MOST form) - - - - No    Tobacco Social History   Tobacco Use  Smoking Status Former Smoker  . Packs/day: 0.50  . Years: 30.00  . Pack years: 15.00  . Types: Cigarettes  . Quit date: 10/04/2001  . Years since quitting: 17.8  Smokeless Tobacco Never Used     Counseling given: Not Answered  Past Medical History:  Diagnosis Date  . Asthmatic bronchitis 09-08-12   03-07-12 bronchits x1  . Essential hypertension, benign 05/25/2016  . GERD (gastroesophageal reflux disease)   . LBP (low back  pain)   . Melanoma ([place])    Right leg and Right breast  . Menopausal symptoms    Dr. Ree Edman  . Osteoarthritis 07/27/2011   left knee  . Vertigo    Past Surgical History:  Procedure Laterality Date  . BREAST EXCISIONAL BIOPSY Right    skin CA  . BROW LIFT Bilateral 11/30/2018   Procedure: BLEPHAROPLASTY;  Surgeon: Wallace Going, DO;  Location: [facility];  Service: Plastics;  Laterality: Bilateral;  90 min surgery time - per provider  . CHOLECYSTECTOMY     laparoscopic  . DILATION AND CURETTAGE OF UTERUS    . LAPAROSCOPIC APPENDECTOMY N/A 01/25/2016   Procedure: APPENDECTOMY LAPAROSCOPIC;  Surgeon: Stark Klein, MD;  Location: [facility];  Service: General;  Laterality: N/A;  . PEXY  09/11/2012   Procedure: PEXY;  Surgeon: Adin Hector, MD;  Location: [facility];  Service: General;  Laterality: N/A;  . TONSILLECTOMY    . TRANSANAL HEMORRHOIDAL DEARTERIALIZATION  09/11/2012   Procedure: TRANSANAL HEMORRHOIDAL DEARTERIALIZATION;  Surgeon: Adin Hector, MD;  Location: [facility];  Service: General;  Laterality: N/A;  [place] hemorrhoidal ligation/pexy  . TUBAL LIGATION     Family History  Problem Relation Age of Onset  . Heart disease Brother   . Diabetes Mother   . Hypertension Mother   . Stroke Sister   . Hypertension Other   . Alzheimer's disease Other    Social History   Socioeconomic History  . Marital status: Married    Spouse name: Not on file  . Number  of children: Not on file  . Years of education: Not on file  . Highest education level: Not on file  Occupational History  . Not on file  Social Needs  . Financial resource strain: Not hard at all  . Food insecurity    Worry: Never true    Inability: Never true  . Transportation needs    Medical: No    Non-medical: No  Tobacco Use  . Smoking status: Former Smoker    Packs/day: 0.50    Years: 30.00    Pack years: 15.00    Types: Cigarettes    Quit date: 10/04/2001    Years since quitting: 17.8  .  Smokeless tobacco: Never Used  Substance and Sexual Activity  . Alcohol use: Yes    Alcohol/week: 1.0 standard drinks    Types: 1 Glasses of Liadan Guizar per week    Comment: rare Sahalie Beth  . Drug use: No  . Sexual activity: Yes  Lifestyle  . Physical activity    Days per week: 2 days    Minutes per session: 40 min  . Stress: Not at all  Relationships  . Social connections    Talks on phone: More than three times a week    Gets together: More than three times a week    Attends religious service: More than 4 times per year    Active member of club or organization: Yes    Attends meetings of clubs or organizations: More than 4 times per year    Relationship status: Married  Other Topics Concern  . Not on file  Social History Narrative   Regular Exercise- no    Outpatient Encounter Medications as of 08/17/2019  Medication Sig  . aspirin EC 81 MG tablet Take 81 mg by mouth daily.  . Cholecalciferol 1000 UNITS tablet Take 1,000 Units by mouth daily.    [name] escitalopram (LEXAPRO) 5 MG tablet Take 1 tablet (5 mg total) by mouth daily.  . hydrOXYzine (ATARAX/VISTARIL) 25 MG tablet Take 1-2 tablets (25-50 mg total) by mouth every 6 (six) hours as needed for itching.  . loratadine (CLARITIN) 10 MG tablet Take 10 mg by mouth daily.  [name] omeprazole (PRILOSEC) 40 MG capsule TAKE 1 CAPSULE(40 MG) BY MOUTH DAILY  . triamcinolone cream (KENALOG) 0.1 % Apply 1 application topically 4 (four) times daily.  [name] triamterene-hydrochlorothiazide (MAXZIDE-25) 37.5-25 MG tablet Take 0.5 tablets by mouth daily.  . vitamin B-12 (CYANOCOBALAMIN) 1000 MCG tablet Take 1,000 mcg by mouth daily.   No facility-administered encounter medications on file as of 08/17/2019.     Activities of Daily Living No flowsheet data found.  Patient Care Team: Plotnikov, Evie Lacks, MD as PCP - General Carlean Purl Ofilia Neas, MD as Consulting Physician (Gastroenterology)    Assessment:   This is a routine wellness examination for Latrece.  Physical assessment deferred to PCP.  Exercise Activities and Dietary recommendations   Diet (meal preparation, eat out, water intake, caffeinated beverages, dairy products, fruits and vegetables): {Desc; diets:16563}   Goals    . Patient Stated     I want to go to [facility] more often by freeing up my schedule to have more time.        Fall Risk Fall Risk  10/24/2018 08/09/2018 07/24/2018 06/23/2017 05/07/2016  Falls in the past year? 0 0 No No No  Number falls in past yr: 0 - - - -  Injury with Fall? 0 - - - -  Risk for fall due to : -  Other (Comment);Impaired balance/gait - - -  Risk for fall due to: Comment - has dizzy spells, has severe dizziness and afraid she will fall in the tub/shower - - -  Follow up Falls evaluation completed - - - -   Is the patient's home free of loose throw rugs in walkways, pet beds, electrical cords, etc?   {Blank single:19197::"yes","no"}      Grab bars in the bathroom? {Blank single:19197::"yes","no"}      Handrails on the stairs?   {Blank single:19197::"yes","no"}      Adequate lighting?   {Blank single:19197::"yes","no"}  Depression Screen PHQ 2/9 Scores 10/24/2018 08/09/2018 07/24/2018 06/23/2017  PHQ - 2 Score 0 1 0 0  PHQ- 9 Score - 1 0 -     Cognitive Function        Immunization History  Administered Date(s) Administered  . Influenza Split 07/27/2011, 08/29/2012  . Influenza Whole 06/02/2010  . Influenza, High Dose Seasonal PF 07/15/2016, 07/11/2017  . Influenza,inj,Quad PF,6+ Mos 09/12/2013, 07/26/2014, 07/02/2015, 06/25/2018  . Pneumococcal Conjugate-13 08/09/2018  . Pneumococcal Polysaccharide-23 05/07/2016  . Td 06/04/2009  . Zoster 08/11/2011   Screening Tests Health Maintenance  Topic Date Due  . INFLUENZA VACCINE  05/05/2019  . TETANUS/TDAP  06/05/2019  . MAMMOGRAM  07/19/2020  . COLONOSCOPY  07/28/2020  . DEXA SCAN  Completed  . Hepatitis C Screening  Completed  . PNA vac Low Risk Adult  Completed      Plan:    I  have personally reviewed and noted the following in the patient's chart:   . Medical and social history . Use of alcohol, tobacco or illicit drugs  . Current medications and supplements . Functional ability and status . Nutritional status . Physical activity . Advanced directives . List of other physicians . Screenings to include cognitive, depression, and falls . Referrals and appointments  In addition, I have reviewed and discussed with patient certain preventive protocols, quality metrics, and best practice recommendations. A written personalized care plan for preventive services as well as general preventive health recommendations were provided to patient.     Michiel Cowboy, RN  08/17/2019

## 2019-09-25 DIAGNOSIS — M9902 Segmental and somatic dysfunction of thoracic region: Secondary | ICD-10-CM | POA: Diagnosis not present

## 2019-09-25 DIAGNOSIS — M9903 Segmental and somatic dysfunction of lumbar region: Secondary | ICD-10-CM | POA: Diagnosis not present

## 2019-09-25 DIAGNOSIS — M9905 Segmental and somatic dysfunction of pelvic region: Secondary | ICD-10-CM | POA: Diagnosis not present

## 2019-09-25 DIAGNOSIS — M5134 Other intervertebral disc degeneration, thoracic region: Secondary | ICD-10-CM | POA: Diagnosis not present

## 2019-09-25 DIAGNOSIS — M5136 Other intervertebral disc degeneration, lumbar region: Secondary | ICD-10-CM | POA: Diagnosis not present

## 2019-11-07 ENCOUNTER — Ambulatory Visit: Payer: Medicare Other

## 2019-11-09 ENCOUNTER — Other Ambulatory Visit: Payer: Self-pay | Admitting: Internal Medicine

## 2019-11-09 MED ORDER — TRIAMTERENE-HCTZ 37.5-25 MG PO TABS: 0.5000 | ORAL_TABLET | Freq: Every day | ORAL | 1 refills | Status: DC

## 2019-11-09 NOTE — Telephone Encounter (Signed)
Patient is requesting a refill on the following medication, Requesting to be sent to pharmacy on file.   triamterene-hydrochlorothiazide (MAXZIDE-25) 37.5-25 MG tablet

## 2019-11-09 NOTE — Telephone Encounter (Signed)
Reviewed chart pt is up-to-date sent refills to [facility].../lmb  

## 2019-11-13 ENCOUNTER — Other Ambulatory Visit: Payer: Self-pay | Admitting: Internal Medicine

## 2019-11-15 ENCOUNTER — Ambulatory Visit: Payer: Medicare Other

## 2019-11-19 ENCOUNTER — Other Ambulatory Visit: Payer: Self-pay

## 2019-11-19 ENCOUNTER — Ambulatory Visit: Payer: Medicare Other | Attending: Internal Medicine

## 2019-11-19 DIAGNOSIS — Z23 Encounter for immunization: Secondary | ICD-10-CM | POA: Insufficient documentation

## 2019-11-19 NOTE — Progress Notes (Signed)
   Covid-19 Vaccination Clinic  Name:  Tina Bird    MRN: [id number] DOB: [birth year]  11/19/2019  Tina Bird was observed post Covid-19 immunization for 15 minutes without incidence. She was provided with Vaccine Information Sheet and instruction to access the V-Safe system.   Tina Bird was instructed to call 911 with any severe reactions post vaccine: [name] Difficulty breathing  . Swelling of your face and throat  . A fast heartbeat  . A bad rash all over your body  . Dizziness and weakness    Immunizations Administered    Name Date Dose VIS Date Route   Moderna COVID-19 Vaccine 11/19/2019 11:04 AM 0.5 mL 09/04/2019 Intramuscular   Manufacturer: Moderna   Lot: [id number]   [id number]

## 2019-12-03 ENCOUNTER — Ambulatory Visit (INDEPENDENT_AMBULATORY_CARE_PROVIDER_SITE_OTHER): Payer: Medicare Other | Admitting: Family

## 2019-12-03 DIAGNOSIS — J309 Allergic rhinitis, unspecified: Secondary | ICD-10-CM | POA: Diagnosis not present

## 2019-12-03 MED ORDER — FLUTICASONE PROPIONATE 50 MCG/ACT NA SUSP: 2.0000 | Freq: Every day | NASAL | 6 refills | Status: AC

## 2019-12-03 NOTE — Progress Notes (Signed)
Tina Bird is a 69 y.o. female with the following history as recorded in EpicCare:  Patient Active Problem List   Diagnosis Date Noted  . Osteopenia 01/25/2019  . Dermatochalasis of both upper eyelids 07/06/2018  . Upper respiratory infection 04/05/2018  . Conjunctivitis 04/05/2018  . Sciatic leg pain 01/25/2018  . Pharyngitis 08/30/2017  . Contact dermatitis 01/26/2017  . Neuropathy 12/07/2016  . Essential hypertension, benign 05/25/2016  . Dyslipidemia 05/25/2016  . Acute appendicitis 01/25/2016  . Rash and nonspecific skin eruption 04/22/2015  . History of colonic polyps 10/08/2014  . Near syncope 11/05/2013  . Heart murmur 11/05/2013  . Well adult exam 10/30/2012  . Internal hemorrhoids with prolapse & pain 07/12/2012  . IBS (irritable bowel syndrome) 07/12/2012  . Diverticulosis of colon with sigmoid stricture 07/12/2012  . Cough 03/07/2012  . Asthmatic bronchitis 03/07/2012  . Hip pain, chronic 12/14/2011  . Glossal erythema 12/14/2011  . Osteoarthritis 07/27/2011  . HIP PAIN 12/02/2010  . MENOPAUSAL SYNDROME 06/02/2010  . DIZZINESS 06/02/2010  . LOW BACK PAIN 05/13/2007    Current Outpatient Medications  Medication Sig Dispense Refill  . acyclovir (ZOVIRAX) 400 MG tablet TAKE 1 TABLET(400 MG) BY MOUTH THREE TIMES DAILY FOR 10 DAYS 21 tablet 3  . fexofenadine (ALLEGRA) 180 MG tablet Take 180 mg by mouth daily.    [name] aspirin EC 81 MG tablet Take 81 mg by mouth daily.    . Cholecalciferol 1000 UNITS tablet Take 1,000 Units by mouth daily.      [name] escitalopram (LEXAPRO) 5 MG tablet Take 1 tablet (5 mg total) by mouth daily. 90 tablet 3  . fluticasone (FLONASE) 50 MCG/ACT nasal spray Place 2 sprays into both nostrils daily. 16 g 6  . hydrOXYzine (ATARAX/VISTARIL) 25 MG tablet Take 1-2 tablets (25-50 mg total) by mouth every 6 (six) hours as needed for itching. 60 tablet 1  . omeprazole (PRILOSEC) 40 MG capsule TAKE 1 CAPSULE(40 MG) BY MOUTH DAILY 90 capsule 2  .  triamcinolone cream (KENALOG) 0.1 % Apply 1 application topically 4 (four) times daily. 450 g 1  . triamterene-hydrochlorothiazide (MAXZIDE-25) 37.5-25 MG tablet Take 0.5 tablets by mouth daily. 45 tablet 1  . vitamin B-12 (CYANOCOBALAMIN) 1000 MCG tablet Take 1,000 mcg by mouth daily.     No current facility-administered medications for this visit.    Allergies: Poison ivy extract  Past Medical History:  Diagnosis Date  . Asthmatic bronchitis 09-08-12   03-07-12 bronchits x1  . Essential hypertension, benign 05/25/2016  . GERD (gastroesophageal reflux disease)   . LBP (low back pain)   . Melanoma ([place])    Right leg and Right breast  . Menopausal symptoms    Dr. Ree Edman  . Osteoarthritis 07/27/2011   left knee  . Vertigo     Past Surgical History:  Procedure Laterality Date  . BREAST EXCISIONAL BIOPSY Right    skin CA  . BROW LIFT Bilateral 11/30/2018   Procedure: BLEPHAROPLASTY;  Surgeon: Wallace Going, DO;  Location: [facility];  Service: Plastics;  Laterality: Bilateral;  90 min surgery time - per provider  . CHOLECYSTECTOMY     laparoscopic  . DILATION AND CURETTAGE OF UTERUS    . LAPAROSCOPIC APPENDECTOMY N/A 01/25/2016   Procedure: APPENDECTOMY LAPAROSCOPIC;  Surgeon: Stark Klein, MD;  Location: [facility];  Service: General;  Laterality: N/A;  . PEXY  09/11/2012   Procedure: PEXY;  Surgeon: Adin Hector, MD;  Location: [facility];  Service: General;  Laterality: N/A;  . TONSILLECTOMY    . TRANSANAL HEMORRHOIDAL DEARTERIALIZATION  09/11/2012   Procedure: TRANSANAL HEMORRHOIDAL DEARTERIALIZATION;  Surgeon: Adin Hector, MD;  Location: [facility];  Service: General;  Laterality: N/A;  [place] hemorrhoidal ligation/pexy  . TUBAL LIGATION      Family History  Problem Relation Age of Onset  . Heart disease Brother   . Diabetes Mother   . Hypertension Mother   . Stroke Sister   . Hypertension Other   . Alzheimer's disease Other     Social History   Tobacco Use   . Smoking status: Former Smoker    Packs/day: 0.50    Years: 30.00    Pack years: 15.00    Types: Cigarettes    Quit date: 10/04/2001    Years since quitting: 18.1  . Smokeless tobacco: Never Used  Substance Use Topics  . Alcohol use: Yes    Alcohol/week: 1.0 standard drinks    Types: 1 Glasses of wine per week    Comment: rare wine    Subjective:    I connected with [place] on 12/03/19 at 11:00 AM EST by a video enabled telemedicine application and verified that I am speaking with the correct person using two identifiers.   I discussed the limitations of evaluation and management by telemedicine and the availability of in person appointments. The patient expressed understanding and agreed to proceed. Provider in office/ patient is at home; provider and patient are only 2 people on video call.   Head congestion, sore throat x 4 days; actually feeling better today; no fever; no concerns for exposure to COVID and defers testing; using OTC Allegra;   Objective:  There were no vitals filed for this visit.  General: Well developed, well nourished, in no acute distress  Head: Normocephalic and atraumatic  Lungs: Respirations unlabored; clear to auscultation bilaterally without wheeze, rales, rhonchi  Neurologic: Alert and oriented; speech intact; face symmetrical; moves all extremities well; CNII-XII intact without focal deficit   Assessment:  1. Allergic rhinitis, unspecified seasonality, unspecified trigger     Plan:   Per patient, she has been feeling better; continue Allegra and add Flonase; she will continue to monitor her blood pressure; follow-up worse, no better- to consider antibiotic and/or in office visit; she defers COVID testing at this time;   No follow-ups on file.  No orders of the defined types were placed in this encounter.   Requested Prescriptions   Signed Prescriptions Disp Refills  . fluticasone (FLONASE) 50 MCG/ACT nasal spray 16 g 6    Sig:  Place 2 sprays into both nostrils daily.

## 2019-12-18 ENCOUNTER — Ambulatory Visit: Payer: Medicare Other | Attending: Internal Medicine

## 2019-12-18 DIAGNOSIS — Z23 Encounter for immunization: Secondary | ICD-10-CM

## 2019-12-18 NOTE — Progress Notes (Signed)
   Covid-19 Vaccination Clinic  Name:  Tina Bird    MRN: [id number] DOB: August 04, 1951  12/18/2019  Tina Bird was observed post Covid-19 immunization for 15 minutes without incident. She was provided with Vaccine Information Sheet and instruction to access the V-Safe system.   Tina Bird was instructed to call 911 with any severe reactions post vaccine: [name] Difficulty breathing  . Swelling of face and throat  . A fast heartbeat  . A bad rash all over body  . Dizziness and weakness   Immunizations Administered    Name Date Dose VIS Date Route   Moderna COVID-19 Vaccine 12/18/2019 10:52 AM 0.5 mL 09/04/2019 Intramuscular   Manufacturer: Moderna   Lot: [id number]   [id number]

## 2019-12-26 ENCOUNTER — Other Ambulatory Visit: Payer: Self-pay | Admitting: Internal Medicine

## 2020-01-08 DIAGNOSIS — H02831 Dermatochalasis of right upper eyelid: Secondary | ICD-10-CM | POA: Diagnosis not present

## 2020-01-08 DIAGNOSIS — H353131 Nonexudative age-related macular degeneration, bilateral, early dry stage: Secondary | ICD-10-CM | POA: Diagnosis not present

## 2020-01-08 DIAGNOSIS — H25813 Combined forms of age-related cataract, bilateral: Secondary | ICD-10-CM | POA: Diagnosis not present

## 2020-01-08 DIAGNOSIS — H04123 Dry eye syndrome of bilateral lacrimal glands: Secondary | ICD-10-CM | POA: Diagnosis not present

## 2020-01-08 DIAGNOSIS — H43811 Vitreous degeneration, right eye: Secondary | ICD-10-CM | POA: Diagnosis not present

## 2020-01-30 ENCOUNTER — Telehealth: Payer: Self-pay | Admitting: Internal Medicine

## 2020-01-30 DIAGNOSIS — R300 Dysuria: Secondary | ICD-10-CM

## 2020-01-30 NOTE — Telephone Encounter (Signed)
Urine orders entered, pt notified

## 2020-01-30 NOTE — Telephone Encounter (Signed)
New message:   Pt has an appt tomorrow for a possible UTI and she would like to come and get labs done today if possible. Please advise.

## 2020-01-31 ENCOUNTER — Other Ambulatory Visit (INDEPENDENT_AMBULATORY_CARE_PROVIDER_SITE_OTHER): Payer: Medicare Other

## 2020-01-31 ENCOUNTER — Other Ambulatory Visit: Payer: Self-pay

## 2020-01-31 ENCOUNTER — Encounter: Payer: Self-pay | Admitting: Internal Medicine

## 2020-01-31 ENCOUNTER — Ambulatory Visit (INDEPENDENT_AMBULATORY_CARE_PROVIDER_SITE_OTHER): Payer: Medicare Other | Admitting: Internal Medicine

## 2020-01-31 DIAGNOSIS — R300 Dysuria: Secondary | ICD-10-CM | POA: Diagnosis not present

## 2020-01-31 DIAGNOSIS — N3000 Acute cystitis without hematuria: Secondary | ICD-10-CM | POA: Diagnosis not present

## 2020-01-31 DIAGNOSIS — N390 Urinary tract infection, site not specified: Secondary | ICD-10-CM | POA: Insufficient documentation

## 2020-01-31 DIAGNOSIS — I10 Essential (primary) hypertension: Secondary | ICD-10-CM | POA: Diagnosis not present

## 2020-01-31 LAB — URINALYSIS, ROUTINE W REFLEX MICROSCOPIC
Bilirubin Urine: NEGATIVE
Hgb urine dipstick: NEGATIVE
Ketones, ur: NEGATIVE
Nitrite: NEGATIVE
Specific Gravity, Urine: 1.01 (ref 1.000–1.030)
Total Protein, Urine: NEGATIVE
Urine Glucose: NEGATIVE
Urobilinogen, UA: 0.2 (ref 0.0–1.0)
pH: 7.5 (ref 5.0–8.0)

## 2020-01-31 MED ORDER — CEFUROXIME AXETIL 250 MG PO TABS: 250.0000 mg | ORAL_TABLET | Freq: Two times a day (BID) | ORAL | 0 refills | Status: DC

## 2020-01-31 MED ORDER — FLUCONAZOLE 150 MG PO TABS: 150.0000 mg | ORAL_TABLET | Freq: Once | ORAL | 1 refills | Status: AC

## 2020-01-31 NOTE — Assessment & Plan Note (Signed)
Ceftin UA and Cx Diflucan

## 2020-01-31 NOTE — Assessment & Plan Note (Signed)
  BP nl at home 

## 2020-01-31 NOTE — Progress Notes (Signed)
Subjective:  Patient ID: Tina Bird, female    DOB: [birth year]  Age: 69 y.o. MRN: [id number]  CC: No chief complaint on file.   HPI [place] presents for UTI sx's x 1 week, burning, frequency. Took AZO  BP nl at home  Outpatient Medications Prior to Visit  Medication Sig Dispense Refill  . acyclovir (ZOVIRAX) 400 MG tablet TAKE 1 TABLET(400 MG) BY MOUTH THREE TIMES DAILY FOR 10 DAYS 21 tablet 3  . aspirin EC 81 MG tablet Take 81 mg by mouth daily.    . Cholecalciferol 1000 UNITS tablet Take 1,000 Units by mouth daily.      [name] escitalopram (LEXAPRO) 5 MG tablet Take 1 tablet (5 mg total) by mouth daily. Annual appt due in [place] must see provider for future refills 90 tablet 1  . fexofenadine (ALLEGRA) 180 MG tablet Take 180 mg by mouth daily.    . fluticasone (FLONASE) 50 MCG/ACT nasal spray Place 2 sprays into both nostrils daily. 16 g 6  . omeprazole (PRILOSEC) 40 MG capsule Take 1 capsule (40 mg total) by mouth daily. Annual appt due in [place] must see provider for future refills 90 capsule 1  . triamterene-hydrochlorothiazide (MAXZIDE-25) 37.5-25 MG tablet Take 0.5 tablets by mouth daily. 45 tablet 1  . vitamin B-12 (CYANOCOBALAMIN) 1000 MCG tablet Take 1,000 mcg by mouth daily.    . hydrOXYzine (ATARAX/VISTARIL) 25 MG tablet Take 1-2 tablets (25-50 mg total) by mouth every 6 (six) hours as needed for itching. (Patient not taking: Reported on 01/31/2020) 60 tablet 1  . triamcinolone cream (KENALOG) 0.1 % Apply 1 application topically 4 (four) times daily. (Patient not taking: Reported on 01/31/2020) 450 g 1   No facility-administered medications prior to visit.    ROS: Review of Systems  Constitutional: Negative for activity change, appetite change, chills, fatigue and unexpected weight change.  HENT: Negative for congestion, mouth sores and sinus pressure.   Eyes: Negative for visual disturbance.  Respiratory: Negative for cough and chest tightness.     Gastrointestinal: Negative for abdominal pain and nausea.  Genitourinary: Positive for decreased urine volume, dysuria and urgency. Negative for difficulty urinating, frequency and vaginal pain.  Musculoskeletal: Positive for back pain. Negative for gait problem.  Skin: Negative for pallor and rash.  Neurological: Negative for dizziness, tremors, weakness, numbness and headaches.  Psychiatric/Behavioral: Negative for confusion and sleep disturbance.    Objective:  BP (!) 150/74 (BP Location: Left Arm, Patient Position: Sitting, Cuff Size: Normal)   Pulse 80   Temp 98.3 F (36.8 C) (Oral)   Ht 5\' 5"  (1.651 m)   Wt 156 lb (70.8 kg)   SpO2 96%   BMI 25.96 kg/m   BP Readings from Last 3 Encounters:  01/31/20 (!) 150/74  04/30/19 (!) 142/80  12/21/18 135/83    Wt Readings from Last 3 Encounters:  01/31/20 156 lb (70.8 kg)  04/30/19 155 lb (70.3 kg)  12/21/18 154 lb (69.9 kg)    Physical Exam Constitutional:      General: She is not in acute distress.    Appearance: She is well-developed.  HENT:     Head: Normocephalic.     Right Ear: External ear normal.     Left Ear: External ear normal.     Nose: Nose normal.  Eyes:     General:        Right eye: No discharge.        Left eye: No discharge.  Conjunctiva/sclera: Conjunctivae normal.     Pupils: Pupils are equal, round, and reactive to light.  Neck:     Thyroid: No thyromegaly.     Vascular: No JVD.     Trachea: No tracheal deviation.  Cardiovascular:     Rate and Rhythm: Normal rate and regular rhythm.     Heart sounds: Normal heart sounds.  Pulmonary:     Effort: No respiratory distress.     Breath sounds: No stridor. No wheezing.  Abdominal:     General: Bowel sounds are normal. There is no distension.     Palpations: Abdomen is soft. There is no mass.     Tenderness: There is no abdominal tenderness. There is no guarding or rebound.  Musculoskeletal:        General: No tenderness.     Cervical back:  Normal range of motion and neck supple.  Lymphadenopathy:     Cervical: No cervical adenopathy.  Skin:    Findings: No erythema or rash.  Neurological:     Cranial Nerves: No cranial nerve deficit.     Motor: No abnormal muscle tone.     Coordination: Coordination normal.     Deep Tendon Reflexes: Reflexes normal.  Psychiatric:        Behavior: Behavior normal.        Thought Content: Thought content normal.        Judgment: Judgment normal.     Lab Results  Component Value Date   WBC 4.6 05/07/2019   HGB 12.9 05/07/2019   HCT 38.8 05/07/2019   PLT 215.0 05/07/2019   GLUCOSE 77 05/07/2019   CHOL 195 05/07/2019   TRIG 262.0 (H) 05/07/2019   HDL 37.40 (L) 05/07/2019   LDLDIRECT 119.0 05/07/2019   LDLCALC 132 (H) 08/15/2017   ALT 17 05/07/2019   AST 16 05/07/2019   NA 140 05/07/2019   K 3.5 05/07/2019   CL 101 05/07/2019   CREATININE 0.87 05/07/2019   BUN 15 05/07/2019   CO2 31 05/07/2019   TSH 1.12 05/07/2019   HGBA1C 6.0 12/07/2016    CT CARDIAC SCORING  Addendum Date: 06/13/2019   ADDENDUM REPORT: 06/13/2019 11:49 CLINICAL DATA:  Risk stratification EXAM: Coronary Calcium Score TECHNIQUE: The patient was scanned on a [employer] scanner. Axial non-contrast 3 mm slices were carried out through the heart. The data set was analyzed on a dedicated work station and scored using the [place]. FINDINGS: Non-cardiac: See separate report from [facility]. Ascending Aorta: Normal Caliber.  No calcifications. Pericardium: Normal Coronary arteries: Normal coronary origins. IMPRESSION: Coronary calcium score of 0. This was 0 percentile for age and sex matched control. Fransico Him Electronically Signed   By: Fransico Him   On: 06/13/2019 11:49   Result Date: 06/13/2019 EXAM: OVER-READ INTERPRETATION  CT CHEST The following report is an over-read performed by radiologist Dr. Vinnie Langton of [facility], [place] on 06/13/2019. This over-read does not include  interpretation of cardiac or coronary anatomy or pathology. The coronary calcium score interpretation by the cardiologist is attached. COMPARISON:  None. FINDINGS: Multiple small calcified granulomas in the lungs bilaterally. Within the visualized portions of the thorax there are no suspicious appearing pulmonary nodules or masses, there is no acute consolidative airspace disease, no pleural effusions, no pneumothorax and no lymphadenopathy. Visualized portions of the upper abdomen demonstrates mild diffuse low attenuation throughout the visualized hepatic parenchyma. There are no aggressive appearing lytic or blastic lesions noted in the visualized portions of the skeleton.  IMPRESSION: 1. Hepatic steatosis. 2. Old granulomatous disease, as above. Electronically Signed: By: Vinnie Langton M.D. On: 06/13/2019 11:07    Assessment & Plan:   There are no diagnoses linked to this encounter.   No orders of the defined types were placed in this encounter.    Follow-up: No follow-ups on file.  Walker Kehr, MD

## 2020-02-02 LAB — CULTURE, URINE COMPREHENSIVE
MICRO NUMBER:: 10420802
RESULT:: NO GROWTH
SPECIMEN QUALITY:: ADEQUATE

## 2020-02-07 DIAGNOSIS — H2512 Age-related nuclear cataract, left eye: Secondary | ICD-10-CM | POA: Diagnosis not present

## 2020-02-21 DIAGNOSIS — H2511 Age-related nuclear cataract, right eye: Secondary | ICD-10-CM | POA: Diagnosis not present

## 2020-03-24 ENCOUNTER — Ambulatory Visit (INDEPENDENT_AMBULATORY_CARE_PROVIDER_SITE_OTHER): Payer: Medicare Other | Admitting: Internal Medicine

## 2020-03-24 ENCOUNTER — Encounter: Payer: Self-pay | Admitting: Internal Medicine

## 2020-03-24 ENCOUNTER — Other Ambulatory Visit: Payer: Self-pay

## 2020-03-24 DIAGNOSIS — M7062 Trochanteric bursitis, left hip: Secondary | ICD-10-CM | POA: Diagnosis not present

## 2020-03-24 DIAGNOSIS — K219 Gastro-esophageal reflux disease without esophagitis: Secondary | ICD-10-CM | POA: Diagnosis not present

## 2020-03-24 DIAGNOSIS — I10 Essential (primary) hypertension: Secondary | ICD-10-CM | POA: Diagnosis not present

## 2020-03-24 DIAGNOSIS — M5430 Sciatica, unspecified side: Secondary | ICD-10-CM

## 2020-03-24 DIAGNOSIS — F419 Anxiety disorder, unspecified: Secondary | ICD-10-CM

## 2020-03-24 MED ORDER — OMEPRAZOLE 40 MG PO CPDR: 40.0000 mg | DELAYED_RELEASE_CAPSULE | Freq: Every day | ORAL | 3 refills | Status: DC

## 2020-03-24 MED ORDER — METHYLPREDNISOLONE 4 MG PO TBPK: ORAL_TABLET | ORAL | 0 refills | Status: DC

## 2020-03-24 MED ORDER — TRIAMTERENE-HCTZ 37.5-25 MG PO TABS: 0.5000 | ORAL_TABLET | Freq: Every day | ORAL | 3 refills | Status: DC

## 2020-03-24 MED ORDER — ESCITALOPRAM OXALATE 5 MG PO TABS: 5.0000 mg | ORAL_TABLET | Freq: Every day | ORAL | 3 refills | Status: DC

## 2020-03-24 NOTE — Assessment & Plan Note (Signed)
Maxzide 1/2 tab/d

## 2020-03-24 NOTE — Assessment & Plan Note (Signed)
Cont w/ Omeprazole °

## 2020-03-24 NOTE — Assessment & Plan Note (Signed)
Medrol pack Hip openers

## 2020-03-24 NOTE — Assessment & Plan Note (Signed)
L hip pain is worse

## 2020-03-24 NOTE — Assessment & Plan Note (Signed)
Injection ofefrered Medrol pack Stretching Massage Handicapped tag

## 2020-03-24 NOTE — Patient Instructions (Signed)
Hip opener exercises      Hip Bursitis  Hip bursitis is swelling of a fluid-filled sac (bursa) in your hip joint. This swelling (inflammation) can be painful. This condition may come and go over time. What are the causes?  Injury to the hip.  Overuse of the muscles that surround the hip joint.  An earlier injury or surgery of the hip.  Arthritis or gout.  Diabetes.  Thyroid disease.  Infection.  In some cases, the cause may not be known. What are the signs or symptoms?  Mild or moderate pain in the hip area. Pain may get worse with movement.  Tenderness and swelling of the hip, especially on the outer side of the hip.  In rare cases, the bursa may become infected. This may cause: ? A fever. ? Warmth and redness in the area. Symptoms may come and go. How is this treated? This condition is treated by resting, icing, applying pressure (compression), and raising (elevating) the injured area. You may hear this called the RICE treatment. Treatment may also include:  Using crutches.  Draining fluid out of the bursa to help relieve swelling.  Giving a shot of (injecting) medicine that helps to reduce swelling (cortisone).  Other medicines if the bursa is infected. Follow these instructions at home: Managing pain, stiffness, and swelling   If told, put ice on the painful area. ? Put ice in a plastic bag. ? Place a towel between your skin and the bag. ? Leave the ice on for 20 minutes, 2-3 times a day. ? Raise (elevate) your hip above the level of your heart as much as you can without pain. To do this, try putting a pillow under your hips while you lie down. Stop if this causes pain. Activity  Return to your normal activities as told by your doctor. Ask your doctor what activities are safe for you.  Rest and protect your hip as much as you can until you feel better. General instructions  Take over-the-counter and prescription medicines only as told by your  doctor.  Wear wraps that put pressure on your hip (compression wraps) only as told by your doctor.  Do not use your hip to support your body weight until your doctor says that you can.  Use crutches as told by your doctor.  Gently rub and stretch your injured area as often as is comfortable.  Keep all follow-up visits as told by your doctor. This is important. How is this prevented?  Exercise regularly, as told by your doctor.  Warm up and stretch before being active.  Cool down and stretch after being active.  Avoid activities that bother your hip or cause pain.  Avoid sitting down for long periods at a time. Contact a doctor if:  You have a fever.  You get new symptoms.  You have trouble walking.  You have trouble doing everyday activities.  You have pain that gets worse.  You have pain that does not get better with medicine.  You get red skin on your hip area.  You get a feeling of warmth in your hip area. Get help right away if:  You cannot move your hip.  You have very bad pain. Summary  Hip bursitis is swelling of a fluid-filled sac (bursa) in your hip.  Hip bursitis can be painful.  Symptoms often come and go over time.  This condition is treated with rest, ice, compression, elevation, and medicines. This information is not intended to replace advice  given to you by your health care provider. Make sure you discuss any questions you have with your health care provider. Document Revised: 05/29/2018 Document Reviewed: 05/29/2018 Elsevier Patient Education  [place].

## 2020-03-24 NOTE — Assessment & Plan Note (Signed)
On Lexapro 

## 2020-03-24 NOTE — Progress Notes (Signed)
Subjective:  Patient ID: Tina Bird, female    DOB: 01-20-1951  Age: 69 y.o. MRN: [id number]  CC: No chief complaint on file.   HPI Tina Bird presents for L >>R hip pain after a [place]cent beach trip  F/u GERD, anxiety,  Outpatient Medications Prior to Visit  Medication Sig Dispense Refill  . acyclovir (ZOVIRAX) 400 MG tablet TAKE 1 TABLET(400 MG) BY MOUTH THREE TIMES DAILY FOR 10 DAYS 21 tablet 3  . aspirin EC 81 MG tablet Take 81 mg by mouth daily.    . Cholecalciferol 1000 UNITS tablet Take 1,000 Units by mouth daily.      [name] escitalopram (LEXAPRO) 5 MG tablet Take 1 tablet (5 mg total) by mouth daily. Annual appt due in [place] must see provider for future refills 90 tablet 1  . fexofenadine (ALLEGRA) 180 MG tablet Take 180 mg by mouth daily.    . fluticasone (FLONASE) 50 MCG/ACT nasal spray Place 2 sprays into both nostrils daily. 16 g 6  . omeprazole (PRILOSEC) 40 MG capsule Take 1 capsule (40 mg total) by mouth daily. Annual appt due in [place] must see provider for future refills 90 capsule 1  . triamterene-hydrochlorothiazide (MAXZIDE-25) 37.5-25 MG tablet Take 0.5 tablets by mouth daily. 45 tablet 1  . vitamin B-12 (CYANOCOBALAMIN) 1000 MCG tablet Take 1,000 mcg by mouth daily.    . cefUROXime (CEFTIN) 250 MG tablet Take 1 tablet (250 mg total) by mouth 2 (two) times daily with a meal. (Patient not taking: Reported on 03/24/2020) 10 tablet 0   No facility-administered medications prior to visit.    ROS: Review of Systems  Objective:  BP 130/68 (BP Location: Left Arm, Patient Position: Sitting, Cuff Size: Normal)   Pulse 77   Temp 98.3 F (36.8 C) (Oral)   Ht 5\' 5"  (1.651 m)   Wt 155 lb (70.3 kg)   SpO2 96%   BMI 25.79 kg/m   BP Readings from Last 3 Encounters:  03/24/20 130/68  01/31/20 (!) 150/74  04/30/19 (!) 142/80    Wt Readings from Last 3 Encounters:  03/24/20 155 lb (70.3 kg)  01/31/20 156 lb (70.8 kg)  04/30/19 155 lb (70.3 kg)     Physical Exam  Lab Results  Component Value Date   WBC 4.6 05/07/2019   HGB 12.9 05/07/2019   HCT 38.8 05/07/2019   PLT 215.0 05/07/2019   GLUCOSE 77 05/07/2019   CHOL 195 05/07/2019   TRIG 262.0 (H) 05/07/2019   HDL 37.40 (L) 05/07/2019   LDLDIRECT 119.0 05/07/2019   LDLCALC 132 (H) 08/15/2017   ALT 17 05/07/2019   AST 16 05/07/2019   NA 140 05/07/2019   K 3.5 05/07/2019   CL 101 05/07/2019   CREATININE 0.87 05/07/2019   BUN 15 05/07/2019   CO2 31 05/07/2019   TSH 1.12 05/07/2019   HGBA1C 6.0 12/07/2016    CT CARDIAC SCORING  Addendum Date: 06/13/2019   ADDENDUM REPORT: 06/13/2019 11:49 CLINICAL DATA:  Risk stratification EXAM: Coronary Calcium Score TECHNIQUE: The patient was scanned on a [employer] scanner. Axial non-contrast 3 mm slices were carried out through the heart. The data set was analyzed on a dedicated work station and scored using the [place]. FINDINGS: Non-cardiac: See separate report from [facility]. Ascending Aorta: Normal Caliber.  No calcifications. Pericardium: Normal Coronary arteries: Normal coronary origins. IMPRESSION: Coronary calcium score of 0. This was 0 percentile for age and sex matched control. Fransico Him Electronically Signed  By: Fransico Him   On: 06/13/2019 11:49   Result Date: 06/13/2019 EXAM: OVER-READ INTERPRETATION  CT CHEST The following report is an over-read performed by radiologist Dr. Vinnie Langton of [facility], [place] on 06/13/2019. This over-read does not include interpretation of cardiac or coronary anatomy or pathology. The coronary calcium score interpretation by the cardiologist is attached. COMPARISON:  None. FINDINGS: Multiple small calcified granulomas in the lungs bilaterally. Within the visualized portions of the thorax there are no suspicious appearing pulmonary nodules or masses, there is no acute consolidative airspace disease, no pleural effusions, no pneumothorax and no lymphadenopathy.  Visualized portions of the upper abdomen demonstrates mild diffuse low attenuation throughout the visualized hepatic parenchyma. There are no aggressive appearing lytic or blastic lesions noted in the visualized portions of the skeleton. IMPRESSION: 1. Hepatic steatosis. 2. Old granulomatous disease, as above. Electronically Signed: By: Vinnie Langton M.D. On: 06/13/2019 11:07    Assessment & Plan:     Follow-up: No follow-ups on file.  Walker Kehr, MD

## 2020-03-25 ENCOUNTER — Other Ambulatory Visit: Payer: Self-pay | Admitting: Internal Medicine

## 2020-04-09 DIAGNOSIS — M9905 Segmental and somatic dysfunction of pelvic region: Secondary | ICD-10-CM | POA: Diagnosis not present

## 2020-04-09 DIAGNOSIS — M5136 Other intervertebral disc degeneration, lumbar region: Secondary | ICD-10-CM | POA: Diagnosis not present

## 2020-04-09 DIAGNOSIS — M5134 Other intervertebral disc degeneration, thoracic region: Secondary | ICD-10-CM | POA: Diagnosis not present

## 2020-04-09 DIAGNOSIS — M9903 Segmental and somatic dysfunction of lumbar region: Secondary | ICD-10-CM | POA: Diagnosis not present

## 2020-04-09 DIAGNOSIS — M9902 Segmental and somatic dysfunction of thoracic region: Secondary | ICD-10-CM | POA: Diagnosis not present

## 2020-04-10 DIAGNOSIS — Z961 Presence of intraocular lens: Secondary | ICD-10-CM | POA: Diagnosis not present

## 2020-05-01 ENCOUNTER — Ambulatory Visit (INDEPENDENT_AMBULATORY_CARE_PROVIDER_SITE_OTHER): Payer: Medicare Other | Admitting: Internal Medicine

## 2020-05-01 ENCOUNTER — Other Ambulatory Visit: Payer: Self-pay

## 2020-05-01 ENCOUNTER — Encounter: Payer: Self-pay | Admitting: Internal Medicine

## 2020-05-01 VITALS — BP 122/76 | HR 83 | Temp 98.0°F | Ht 65.0 in | Wt 156.0 lb

## 2020-05-01 DIAGNOSIS — I10 Essential (primary) hypertension: Secondary | ICD-10-CM | POA: Diagnosis not present

## 2020-05-01 DIAGNOSIS — M545 Low back pain, unspecified: Secondary | ICD-10-CM

## 2020-05-01 DIAGNOSIS — G8929 Other chronic pain: Secondary | ICD-10-CM

## 2020-05-01 DIAGNOSIS — M25559 Pain in unspecified hip: Secondary | ICD-10-CM | POA: Diagnosis not present

## 2020-05-01 DIAGNOSIS — Z0000 Encounter for general adult medical examination without abnormal findings: Secondary | ICD-10-CM

## 2020-05-01 DIAGNOSIS — E785 Hyperlipidemia, unspecified: Secondary | ICD-10-CM | POA: Diagnosis not present

## 2020-05-01 DIAGNOSIS — Z23 Encounter for immunization: Secondary | ICD-10-CM | POA: Diagnosis not present

## 2020-05-01 LAB — LIPID PANEL
Cholesterol: 222 mg/dL — ABNORMAL HIGH (ref <200)
HDL: 38 mg/dL — ABNORMAL LOW (ref >=50)
LDL Cholesterol (Calc): 139 mg/dL (calc) — ABNORMAL HIGH
Non-HDL Cholesterol (Calc): 184 mg/dL (calc) — ABNORMAL HIGH (ref <130)
Total CHOL/HDL Ratio: 5.8 (calc) — ABNORMAL HIGH (ref <5.0)
Triglycerides: 291 mg/dL — ABNORMAL HIGH (ref <150)

## 2020-05-01 MED ORDER — METHYLPREDNISOLONE ACETATE 80 MG/ML IJ SUSP
80.0000 mg | Freq: Once | INTRAMUSCULAR | Status: AC
  Administered 2020-05-01: 80 mg via INTRAMUSCULAR

## 2020-05-01 NOTE — Assessment & Plan Note (Signed)
A little better 

## 2020-05-01 NOTE — Patient Instructions (Signed)
Postprocedure instructions :    A Band-Aid should be left on for 12 hours. Injection therapy is not a cure itself. It is used in conjunction with other modalities. You can use nonsteroidal anti-inflammatories like ibuprofen , hot and cold compresses. Rest is recommended in the next 24 hours. You need to report immediately  if fever, chills or any signs of infection develop. 

## 2020-05-01 NOTE — Assessment & Plan Note (Addendum)
Labs  CT IMPRESSION: Coronary calcium score of 0. This was 0 percentile for age and sex matched control. Fransico Him Electronically Signed   By: Fransico Him   On: 06/13/2019 11:49

## 2020-05-01 NOTE — Assessment & Plan Note (Signed)
BP Readings from Last 3 Encounters:  05/01/20 122/76  03/24/20 130/68  01/31/20 (!) 150/74

## 2020-05-01 NOTE — Progress Notes (Signed)
Subjective:  Patient ID: Tina Bird, female    DOB: 12/30/1950  Age: 69 y.o. MRN: [id number]  CC: No chief complaint on file.   HPI Tina Bird presents for L>>R hip pain - 10% better F/u elev TG F/u LBP  Outpatient Medications Prior to Visit  Medication Sig Dispense Refill  . acyclovir (ZOVIRAX) 400 MG tablet TAKE 1 TABLET(400 MG) BY MOUTH THREE TIMES DAILY FOR 10 DAYS 21 tablet 3  . aspirin EC 81 MG tablet Take 81 mg by mouth daily.    . Cholecalciferol 1000 UNITS tablet Take 1,000 Units by mouth daily.      [name] escitalopram (LEXAPRO) 5 MG tablet Take 1 tablet (5 mg total) by mouth daily. Annual appt due in [place] must see provider for future refills 90 tablet 3  . fexofenadine (ALLEGRA) 180 MG tablet Take 180 mg by mouth daily.    . fluticasone (FLONASE) 50 MCG/ACT nasal spray Place 2 sprays into both nostrils daily. 16 g 6  . omeprazole (PRILOSEC) 40 MG capsule Take 1 capsule (40 mg total) by mouth daily. Annual appt due in [place] must see provider for future refills 90 capsule 3  . triamterene-hydrochlorothiazide (MAXZIDE-25) 37.5-25 MG tablet TAKE 1/2 TABLET BY MOUTH DAILY 45 tablet 3  . vitamin B-12 (CYANOCOBALAMIN) 1000 MCG tablet Take 1,000 mcg by mouth daily.    . cefUROXime (CEFTIN) 250 MG tablet Take 1 tablet (250 mg total) by mouth 2 (two) times daily with a meal. (Patient not taking: Reported on 05/01/2020) 10 tablet 0  . methylPREDNISolone (MEDROL DOSEPAK) 4 MG TBPK tablet As directed (Patient not taking: Reported on 05/01/2020) 21 tablet 0   No facility-administered medications prior to visit.    ROS: Review of Systems  Constitutional: Negative for activity change, appetite change, chills, fatigue and unexpected weight change.  HENT: Negative for congestion, mouth sores and sinus pressure.   Eyes: Negative for visual disturbance.  Respiratory: Negative for cough and chest tightness.   Gastrointestinal: Negative for abdominal pain and nausea.    Genitourinary: Negative for difficulty urinating, frequency and vaginal pain.  Musculoskeletal: Positive for arthralgias and back pain. Negative for gait problem.  Skin: Negative for pallor and rash.  Neurological: Negative for dizziness, tremors, weakness, numbness and headaches.  Psychiatric/Behavioral: Negative for confusion and sleep disturbance.    Objective:  BP 122/76 (BP Location: Right Arm, Patient Position: Sitting, Cuff Size: Large)   Pulse 83   Temp 98 F (36.7 C) (Oral)   Ht 5\' 5"  (1.651 m)   Wt 156 lb (70.8 kg)   SpO2 98%   BMI 25.96 kg/m   BP Readings from Last 3 Encounters:  05/01/20 122/76  03/24/20 130/68  01/31/20 (!) 150/74    Wt Readings from Last 3 Encounters:  05/01/20 156 lb (70.8 kg)  03/24/20 155 lb (70.3 kg)  01/31/20 156 lb (70.8 kg)    Physical Exam Constitutional:      General: She is not in acute distress.    Appearance: She is well-developed.  HENT:     Head: Normocephalic.     Right Ear: External ear normal.     Left Ear: External ear normal.     Nose: Nose normal.  Eyes:     General:        Right eye: No discharge.        Left eye: No discharge.     Conjunctiva/sclera: Conjunctivae normal.     Pupils: Pupils are equal, round, and reactive  to light.  Neck:     Thyroid: No thyromegaly.     Vascular: No JVD.     Trachea: No tracheal deviation.  Cardiovascular:     Rate and Rhythm: Normal rate and regular rhythm.     Heart sounds: Normal heart sounds.  Pulmonary:     Effort: No respiratory distress.     Breath sounds: No stridor. No wheezing.  Abdominal:     General: Bowel sounds are normal. There is no distension.     Palpations: Abdomen is soft. There is no mass.     Tenderness: There is no abdominal tenderness. There is no guarding or rebound.  Musculoskeletal:        General: Tenderness present.     Cervical back: Normal range of motion and neck supple.  Lymphadenopathy:     Cervical: No cervical adenopathy.  Skin:     Findings: No erythema or rash.  Neurological:     Cranial Nerves: No cranial nerve deficit.     Motor: No abnormal muscle tone.     Coordination: Coordination normal.     Deep Tendon Reflexes: Reflexes normal.  Psychiatric:        Behavior: Behavior normal.        Thought Content: Thought content normal.        Judgment: Judgment normal.   L lat hip w/pain  str leg elev (-) B   Procedure Note :     Procedure : Joint Injection,   L hip   Indication:  Trochanteric bursitis with refractory  chronic pain.   Risks including unsuccessful procedure , bleeding, infection, bruising, skin atrophy, "steroid flare-up" and others were explained to the patient in detail as well as the benefits. Informed consent was obtained and signed.   Tthe patient was placed in a comfortable lateral decubitus position. The point of maximal tenderness was identified. Skin was prepped with Betadine and alcohol. Then, a 5 cc syringe with a 2 inch long 24-gauge needle was used for a bursa injection.. The needle was advanced  Into the bursa. I injected the bursa with 4 mL of 2% lidocaine and 80 mg of Depo-Medrol .  Band-Aid was applied.   Tolerated well. Complications: None. Good pain relief following the procedure.   Postprocedure instructions :    A Band-Aid should be left on for 12 hours. Injection therapy is not a cure itself. It is used in conjunction with other modalities. You can use nonsteroidal anti-inflammatories like ibuprofen , hot and cold compresses. Rest is recommended in the next 24 hours. You need to report immediately  if fever, chills or any signs of infection develop.     Lab Results  Component Value Date   WBC 4.6 05/07/2019   HGB 12.9 05/07/2019   HCT 38.8 05/07/2019   PLT 215.0 05/07/2019   GLUCOSE 77 05/07/2019   CHOL 195 05/07/2019   TRIG 262.0 (H) 05/07/2019   HDL 37.40 (L) 05/07/2019   LDLDIRECT 119.0 05/07/2019   LDLCALC 132 (H) 08/15/2017   ALT 17 05/07/2019   AST 16  05/07/2019   NA 140 05/07/2019   K 3.5 05/07/2019   CL 101 05/07/2019   CREATININE 0.87 05/07/2019   BUN 15 05/07/2019   CO2 31 05/07/2019   TSH 1.12 05/07/2019   HGBA1C 6.0 12/07/2016    CT CARDIAC SCORING  Addendum Date: 06/13/2019   ADDENDUM REPORT: 06/13/2019 11:49 CLINICAL DATA:  Risk stratification EXAM: Coronary Calcium Score TECHNIQUE: The patient was scanned on a  [employer]. Axial non-contrast 3 mm slices were carried out through the heart. The data set was analyzed on a dedicated work station and scored using the [place]. FINDINGS: Non-cardiac: See separate report from [facility]. Ascending Aorta: Normal Caliber.  No calcifications. Pericardium: Normal Coronary arteries: Normal coronary origins. IMPRESSION: Coronary calcium score of 0. This was 0 percentile for age and sex matched control. Fransico Him Electronically Signed   By: Fransico Him   On: 06/13/2019 11:49   Result Date: 06/13/2019 EXAM: OVER-READ INTERPRETATION  CT CHEST The following report is an over-read performed by radiologist Dr. Vinnie Langton of [facility], [place] on 06/13/2019. This over-read does not include interpretation of cardiac or coronary anatomy or pathology. The coronary calcium score interpretation by the cardiologist is attached. COMPARISON:  None. FINDINGS: Multiple small calcified granulomas in the lungs bilaterally. Within the visualized portions of the thorax there are no suspicious appearing pulmonary nodules or masses, there is no acute consolidative airspace disease, no pleural effusions, no pneumothorax and no lymphadenopathy. Visualized portions of the upper abdomen demonstrates mild diffuse low attenuation throughout the visualized hepatic parenchyma. There are no aggressive appearing lytic or blastic lesions noted in the visualized portions of the skeleton. IMPRESSION: 1. Hepatic steatosis. 2. Old granulomatous disease, as above. Electronically Signed: By: Vinnie Langton M.D. On: 06/13/2019 11:07    Assessment & Plan:   There are no diagnoses linked to this encounter.   No orders of the defined types were placed in this encounter.    Follow-up: No follow-ups on file.  Walker Kehr, MD

## 2020-05-01 NOTE — Assessment & Plan Note (Signed)
See procedure 

## 2020-05-01 NOTE — Addendum Note (Signed)
Addended by: Darlys Gales on: 05/01/2020 05:05 PM   Modules accepted: Orders

## 2020-07-16 ENCOUNTER — Encounter: Payer: Self-pay | Admitting: Internal Medicine

## 2020-07-16 ENCOUNTER — Other Ambulatory Visit: Payer: Self-pay

## 2020-07-16 ENCOUNTER — Ambulatory Visit (INDEPENDENT_AMBULATORY_CARE_PROVIDER_SITE_OTHER): Payer: Medicare Other | Admitting: Internal Medicine

## 2020-07-16 VITALS — BP 134/74 | HR 76 | Temp 98.2°F | Ht 65.0 in | Wt 157.0 lb

## 2020-07-16 DIAGNOSIS — R519 Headache, unspecified: Secondary | ICD-10-CM | POA: Diagnosis not present

## 2020-07-16 DIAGNOSIS — R42 Dizziness and giddiness: Secondary | ICD-10-CM

## 2020-07-16 NOTE — Patient Instructions (Signed)
Neck continue the ice, heat, and ibuprofen.    For the vertigo continue the meclizine as needed.    Please call if there is no improvement in your symptoms.

## 2020-07-16 NOTE — Progress Notes (Signed)
Subjective:    Patient ID: Tina Bird, female    DOB: [birth year], 69 y.o.   MRN: [id number]  HPI The patient is here for an acute visit.     Two weeks ago the back of her head was so sore when she woke up.  It was only on the right side.  It did radiate up to the top of her head intermittently.  It has gotten better.  She finds that it is worse with looking down and turning to the right.  Ibuprofen and Tylenol have not helped.  About 1 week ago she woke up with vertigo.  She has had vertigo in the past.  It did go away after she started moving around.  She took a meclizine that day.  She had the vertigo for a few days intermittently and did have it this morning, but was better.  She is not taking the meclizine since the first day.  She was concerned between the 2 symptoms if she had an ear infection or something else was going on.         Medications and allergies reviewed with patient and updated if appropriate.  Patient Active Problem List   Diagnosis Date Noted  . Trochanteric bursitis of left hip 03/24/2020  . GERD (gastroesophageal reflux disease) 03/24/2020  . Anxiety disorder 03/24/2020  . Urinary tract infection 01/31/2020  . Osteopenia 01/25/2019  . Dermatochalasis of both upper eyelids 07/06/2018  . Upper respiratory infection 04/05/2018  . Conjunctivitis 04/05/2018  . Sciatic leg pain 01/25/2018  . Pharyngitis 08/30/2017  . Contact dermatitis 01/26/2017  . Neuropathy 12/07/2016  . Essential hypertension, benign 05/25/2016  . Dyslipidemia 05/25/2016  . Acute appendicitis 01/25/2016  . Rash and nonspecific skin eruption 04/22/2015  . History of colonic polyps 10/08/2014  . Near syncope 11/05/2013  . Heart murmur 11/05/2013  . Well adult exam 10/30/2012  . Internal hemorrhoids with prolapse & pain 07/12/2012  . IBS (irritable bowel syndrome) 07/12/2012  . Diverticulosis of colon with sigmoid stricture 07/12/2012  . Cough 03/07/2012  .  Asthmatic bronchitis 03/07/2012  . Hip pain, chronic 12/14/2011  . Glossal erythema 12/14/2011  . Osteoarthritis 07/27/2011  . HIP PAIN 12/02/2010  . MENOPAUSAL SYNDROME 06/02/2010  . DIZZINESS 06/02/2010  . LOW BACK PAIN 05/13/2007    Current Outpatient Medications on File Prior to Visit  Medication Sig Dispense Refill  . acyclovir (ZOVIRAX) 400 MG tablet TAKE 1 TABLET(400 MG) BY MOUTH THREE TIMES DAILY FOR 10 DAYS 21 tablet 3  . aspirin EC 81 MG tablet Take 81 mg by mouth daily.    . Cholecalciferol 1000 UNITS tablet Take 1,000 Units by mouth daily.      [name] escitalopram (LEXAPRO) 5 MG tablet Take 1 tablet (5 mg total) by mouth daily. Annual appt due in [place] must see provider for future refills 90 tablet 3  . fexofenadine (ALLEGRA) 180 MG tablet Take 180 mg by mouth daily.    . fluticasone (FLONASE) 50 MCG/ACT nasal spray Place 2 sprays into both nostrils daily. 16 g 6  . omeprazole (PRILOSEC) 40 MG capsule Take 1 capsule (40 mg total) by mouth daily. Annual appt due in [place] must see provider for future refills 90 capsule 3  . triamterene-hydrochlorothiazide (MAXZIDE-25) 37.5-25 MG tablet TAKE 1/2 TABLET BY MOUTH DAILY 45 tablet 3  . vitamin B-12 (CYANOCOBALAMIN) 1000 MCG tablet Take 1,000 mcg by mouth daily.    . cefUROXime (CEFTIN) 250 MG tablet Take 1 tablet (250  mg total) by mouth 2 (two) times daily with a meal. (Patient not taking: Reported on 05/01/2020) 10 tablet 0   No current facility-administered medications on file prior to visit.    Past Medical History:  Diagnosis Date  . Asthmatic bronchitis 09-08-12   03-07-12 bronchits x1  . Essential hypertension, benign 05/25/2016  . GERD (gastroesophageal reflux disease)   . LBP (low back pain)   . Melanoma ([place])    Right leg and Right breast  . Menopausal symptoms    Dr. Ree Edman  . Osteoarthritis 07/27/2011   left knee  . Vertigo     Past Surgical History:  Procedure Laterality Date  . BREAST EXCISIONAL BIOPSY Right     skin CA  . BROW LIFT Bilateral 11/30/2018   Procedure: BLEPHAROPLASTY;  Surgeon: Wallace Going, DO;  Location: [place];  Service: Plastics;  Laterality: Bilateral;  90 min surgery time - per provider  . CHOLECYSTECTOMY     laparoscopic  . DILATION AND CURETTAGE OF UTERUS    . LAPAROSCOPIC APPENDECTOMY N/A 01/25/2016   Procedure: APPENDECTOMY LAPAROSCOPIC;  Surgeon: Stark Klein, MD;  Location: [place];  Service: General;  Laterality: N/A;  . PEXY  09/11/2012   Procedure: PEXY;  Surgeon: Adin Hector, MD;  Location: [facility];  Service: General;  Laterality: N/A;  . TONSILLECTOMY    . TRANSANAL HEMORRHOIDAL DEARTERIALIZATION  09/11/2012   Procedure: TRANSANAL HEMORRHOIDAL DEARTERIALIZATION;  Surgeon: Adin Hector, MD;  Location: [facility];  Service: General;  Laterality: N/A;  [place] hemorrhoidal ligation/pexy  . TUBAL LIGATION      Social History   Socioeconomic History  . Marital status: Married    Spouse name: Not on file  . Number of children: Not on file  . Years of education: Not on file  . Highest education level: Not on file  Occupational History  . Not on file  Tobacco Use  . Smoking status: Former Smoker    Packs/day: 0.50    Years: 30.00    Pack years: 15.00    Types: Cigarettes    Quit date: 10/04/2001    Years since quitting: 18.7  . Smokeless tobacco: Never Used  Vaping Use  . Vaping Use: Never used  Substance and Sexual Activity  . Alcohol use: Yes    Alcohol/week: 1.0 standard drink    Types: 1 Glasses of wine per week    Comment: rare wine  . Drug use: No  . Sexual activity: Yes  Other Topics Concern  . Not on file  Social History Narrative   Regular Exercise- no   Social Determinants of Health   Financial Resource Strain:   . Difficulty of Paying Living Expenses: Not on file  Food Insecurity:   . Worried About Charity fundraiser in the Last Year: Not on file  . Ran Out of Food in the Last Year: Not on file  Transportation Needs:    . Lack of Transportation (Medical): Not on file  . Lack of Transportation (Non-Medical): Not on file  Physical Activity:   . Days of Exercise per Week: Not on file  . Minutes of Exercise per Session: Not on file  Stress:   . Feeling of Stress : Not on file  Social Connections:   . Frequency of Communication with Friends and Family: Not on file  . Frequency of Social Gatherings with Friends and Family: Not on file  . Attends Religious Services: Not on file  . Active Member  of Clubs or Organizations: Not on file  . Attends Archivist Meetings: Not on file  . Marital Status: Not on file    Family History  Problem Relation Age of Onset  . Heart disease Brother   . Diabetes Mother   . Hypertension Mother   . Stroke Sister   . Hypertension Other   . Alzheimer's disease Other     Review of Systems  Constitutional: Negative for chills and fever.  HENT: Negative for congestion, ear pain, sinus pressure, sinus pain and sore throat.   Respiratory: Negative for cough, shortness of breath and wheezing.   Gastrointestinal: Negative.   Musculoskeletal: Positive for neck stiffness. Negative for neck pain.  Neurological: Positive for dizziness and headaches (Right posterior). Negative for weakness and numbness.       Objective:   Vitals:   07/16/20 1513  BP: 134/74  Pulse: 76  Temp: 98.2 F (36.8 C)  SpO2: 97%   BP Readings from Last 3 Encounters:  07/16/20 134/74  05/01/20 122/76  03/24/20 130/68   Wt Readings from Last 3 Encounters:  07/16/20 157 lb (71.2 kg)  05/01/20 156 lb (70.8 kg)  03/24/20 155 lb (70.3 kg)   Body mass index is 26.13 kg/m.   Physical Exam    GENERAL APPEARANCE: Appears stated age, well appearing, NAD EYES: conjunctiva clear, no icterus HEENT: bilateral tympanic membranes and ear canals normal, oropharynx with no erythema, no thyromegaly, trachea midline, no cervical or supraclavicular lymphadenopathy LUNGS: Clear to auscultation  without wheeze or crackles, unlabored breathing, good air entry bilaterally CARDIOVASCULAR: Normal S1,S2 without murmurs, no edema Musculoskeletal: No posterior neck tenderness with palpation.  Slight tenderness right posterior head-increase in pain with certain movements of head SKIN: Warm, dry.  No rash      Assessment & Plan:   Vertigo: Acute Has had vertigo over the years Her current symptoms are improving and typical of her vertigo she is experienced in the past Unlikely related to her right posterior headache Continue meclizine as needed   Right posterior headache: Started 2 weeks ago and has improved Worse with head movements and likely musculoskeletal in nature, not a true headache Possibly neurology of a muscle pain Continue ibuprofen or Tylenol Ice, heat Deferred muscle relaxer or Medrol Dosepak Call if no improvement      This visit occurred during the SARS-CoV-2 public health emergency.  Safety protocols were in place, including screening questions prior to the visit, additional usage of staff PPE, and extensive cleaning of exam room while observing appropriate contact time as indicated for disinfecting solutions.

## 2020-07-30 DIAGNOSIS — M9903 Segmental and somatic dysfunction of lumbar region: Secondary | ICD-10-CM | POA: Diagnosis not present

## 2020-07-30 DIAGNOSIS — M5134 Other intervertebral disc degeneration, thoracic region: Secondary | ICD-10-CM | POA: Diagnosis not present

## 2020-07-30 DIAGNOSIS — M9902 Segmental and somatic dysfunction of thoracic region: Secondary | ICD-10-CM | POA: Diagnosis not present

## 2020-07-30 DIAGNOSIS — M9905 Segmental and somatic dysfunction of pelvic region: Secondary | ICD-10-CM | POA: Diagnosis not present

## 2020-07-30 DIAGNOSIS — M5136 Other intervertebral disc degeneration, lumbar region: Secondary | ICD-10-CM | POA: Diagnosis not present

## 2020-09-01 ENCOUNTER — Other Ambulatory Visit: Payer: Self-pay

## 2020-09-01 ENCOUNTER — Ambulatory Visit (INDEPENDENT_AMBULATORY_CARE_PROVIDER_SITE_OTHER): Payer: Medicare Other

## 2020-09-01 ENCOUNTER — Ambulatory Visit (INDEPENDENT_AMBULATORY_CARE_PROVIDER_SITE_OTHER): Payer: Medicare Other | Admitting: Internal Medicine

## 2020-09-01 ENCOUNTER — Encounter: Payer: Self-pay | Admitting: Internal Medicine

## 2020-09-01 VITALS — BP 142/78 | HR 78 | Temp 98.6°F | Wt 156.8 lb

## 2020-09-01 DIAGNOSIS — G8929 Other chronic pain: Secondary | ICD-10-CM | POA: Diagnosis not present

## 2020-09-01 DIAGNOSIS — M545 Low back pain, unspecified: Secondary | ICD-10-CM

## 2020-09-01 DIAGNOSIS — M79605 Pain in left leg: Secondary | ICD-10-CM

## 2020-09-01 DIAGNOSIS — M25552 Pain in left hip: Secondary | ICD-10-CM

## 2020-09-01 MED ORDER — TIZANIDINE HCL 4 MG PO TABS: 2.0000 mg | ORAL_TABLET | Freq: Three times a day (TID) | ORAL | 1 refills | Status: DC | PRN

## 2020-09-01 MED ORDER — MELOXICAM 7.5 MG PO TABS: 7.5000 mg | ORAL_TABLET | Freq: Every day | ORAL | 0 refills | Status: DC

## 2020-09-01 NOTE — Assessment & Plan Note (Signed)
L hip bursitis/IT band pain vs sciatica Meloxicam Zanaflex Pt declined steroids LS X ray Hip opener exercises

## 2020-09-01 NOTE — Assessment & Plan Note (Addendum)
L hip bursitis/IT band pain vs sciatica Meloxicam Zanaflex Pt declined steroids LS X ray Hip opener exercises

## 2020-09-01 NOTE — Progress Notes (Signed)
Subjective:  Patient ID: Tina Bird, female    DOB: 11/20/1950  Age: 69 y.o. MRN: [id number]  CC: Hip Pain (Left)   HPI Tina Bird presents for LLE lat pain 6-7/10 and cramping (thigh, calf) x 6 wks Worse w/sitting, walking  Outpatient Medications Prior to Visit  Medication Sig Dispense Refill  . acyclovir (ZOVIRAX) 400 MG tablet TAKE 1 TABLET(400 MG) BY MOUTH THREE TIMES DAILY FOR 10 DAYS 21 tablet 3  . aspirin EC 81 MG tablet Take 81 mg by mouth daily.    . Cholecalciferol 1000 UNITS tablet Take 1,000 Units by mouth daily.      [name] escitalopram (LEXAPRO) 5 MG tablet Take 1 tablet (5 mg total) by mouth daily. Annual appt due in [place] must see provider for future refills 90 tablet 3  . fexofenadine (ALLEGRA) 180 MG tablet Take 180 mg by mouth daily.    . fluticasone (FLONASE) 50 MCG/ACT nasal spray Place 2 sprays into both nostrils daily. 16 g 6  . omeprazole (PRILOSEC) 40 MG capsule Take 1 capsule (40 mg total) by mouth daily. Annual appt due in [place] must see provider for future refills 90 capsule 3  . triamterene-hydrochlorothiazide (MAXZIDE-25) 37.5-25 MG tablet TAKE 1/2 TABLET BY MOUTH DAILY 45 tablet 3  . vitamin B-12 (CYANOCOBALAMIN) 1000 MCG tablet Take 1,000 mcg by mouth daily.     No facility-administered medications prior to visit.    ROS: Review of Systems  Objective:  BP (!) 142/78 (BP Location: Left Arm)   Pulse 78   Temp 98.6 F (37 C) (Oral)   Wt 156 lb 12.8 oz (71.1 kg)   SpO2 97%   BMI 26.09 kg/m   BP Readings from Last 3 Encounters:  09/01/20 (!) 142/78  07/16/20 134/74  05/01/20 122/76    Wt Readings from Last 3 Encounters:  09/01/20 156 lb 12.8 oz (71.1 kg)  07/16/20 157 lb (71.2 kg)  05/01/20 156 lb (70.8 kg)    Physical Exam  Lab Results  Component Value Date   WBC 4.6 05/07/2019   HGB 12.9 05/07/2019   HCT 38.8 05/07/2019   PLT 215.0 05/07/2019   GLUCOSE 77 05/07/2019   CHOL 222 (H) 05/01/2020   TRIG 291 (H)  05/01/2020   HDL 38 (L) 05/01/2020   LDLDIRECT 119.0 05/07/2019   LDLCALC 139 (H) 05/01/2020   ALT 17 05/07/2019   AST 16 05/07/2019   NA 140 05/07/2019   K 3.5 05/07/2019   CL 101 05/07/2019   CREATININE 0.87 05/07/2019   BUN 15 05/07/2019   CO2 31 05/07/2019   TSH 1.12 05/07/2019   HGBA1C 6.0 12/07/2016    CT CARDIAC SCORING  Addendum Date: 06/13/2019   ADDENDUM REPORT: 06/13/2019 11:49 CLINICAL DATA:  Risk stratification EXAM: Coronary Calcium Score TECHNIQUE: The patient was scanned on a [employer] scanner. Axial non-contrast 3 mm slices were carried out through the heart. The data set was analyzed on a dedicated work station and scored using the [place]. FINDINGS: Non-cardiac: See separate report from [facility]. Ascending Aorta: Normal Caliber.  No calcifications. Pericardium: Normal Coronary arteries: Normal coronary origins. IMPRESSION: Coronary calcium score of 0. This was 0 percentile for age and sex matched control. Fransico Him Electronically Signed   By: Fransico Him   On: 06/13/2019 11:49   Result Date: 06/13/2019 EXAM: OVER-READ INTERPRETATION  CT CHEST The following report is an over-read performed by radiologist Dr. Vinnie Langton of [facility], [place] on 06/13/2019. This  over-read does not include interpretation of cardiac or coronary anatomy or pathology. The coronary calcium score interpretation by the cardiologist is attached. COMPARISON:  None. FINDINGS: Multiple small calcified granulomas in the lungs bilaterally. Within the visualized portions of the thorax there are no suspicious appearing pulmonary nodules or masses, there is no acute consolidative airspace disease, no pleural effusions, no pneumothorax and no lymphadenopathy. Visualized portions of the upper abdomen demonstrates mild diffuse low attenuation throughout the visualized hepatic parenchyma. There are no aggressive appearing lytic or blastic lesions noted in the visualized portions of  the skeleton. IMPRESSION: 1. Hepatic steatosis. 2. Old granulomatous disease, as above. Electronically Signed: By: Vinnie Langton M.D. On: 06/13/2019 11:07    Assessment & Plan:   There are no diagnoses linked to this encounter.   No orders of the defined types were placed in this encounter.    Follow-up: No follow-ups on file.  Walker Kehr, MD

## 2020-09-01 NOTE — Patient Instructions (Signed)
   B-complex with Niacin 100 mg    Lion's mane  

## 2020-09-04 ENCOUNTER — Telehealth: Payer: Self-pay | Admitting: Internal Medicine

## 2020-09-04 NOTE — Telephone Encounter (Signed)
Patient states her sinus' are acting up and it happens every year and she was wondering if Dr. Alain Marion would send anything in for her. Patient denies appointment at this time because she had an appointment on 11.29.21

## 2020-09-05 NOTE — Telephone Encounter (Signed)
Please use over-the-counter loratadine 10 mg daily and Flonase nasal spray 2 sprays each nostril daily.  Thanks

## 2020-09-05 NOTE — Telephone Encounter (Signed)
Notified pt w/MD response.../lmb 

## 2020-09-07 ENCOUNTER — Encounter: Payer: Self-pay | Admitting: Internal Medicine

## 2020-09-09 ENCOUNTER — Telehealth (INDEPENDENT_AMBULATORY_CARE_PROVIDER_SITE_OTHER): Payer: Medicare Other | Admitting: Family Medicine

## 2020-09-09 ENCOUNTER — Other Ambulatory Visit: Payer: Self-pay

## 2020-09-09 DIAGNOSIS — R0981 Nasal congestion: Secondary | ICD-10-CM

## 2020-09-09 MED ORDER — AMOXICILLIN-POT CLAVULANATE 875-125 MG PO TABS: 1.0000 | ORAL_TABLET | Freq: Two times a day (BID) | ORAL | 0 refills | Status: DC

## 2020-09-09 NOTE — Patient Instructions (Signed)
-  I sent the medication(s) we discussed to your pharmacy: Meds ordered this encounter  Medications  . amoxicillin-clavulanate (AUGMENTIN) 875-125 MG tablet    Sig: Take 1 tablet by mouth 2 (two) times daily.    Dispense:  20 tablet    Refill:  0     I hope you are feeling better soon!  Seek in person care promptly if your symptoms worsen, new concerns arise or you are not improving with treatment.  It was nice to meet you today. I help [facility] out with telemedicine visits on Tuesdays and Thursdays and am available for visits on those days. If you have any concerns or questions following this visit please schedule a follow up visit with your Primary Care doctor or seek care at a local urgent care clinic to avoid delays in care.   

## 2020-09-09 NOTE — Progress Notes (Signed)
Virtual Visit via Telephone Note  I connected with Tina Bird on 09/09/20 at  5:00 PM EST by telephone and verified that I am speaking with the correct person using two identifiers.   I discussed the limitations, risks, security and privacy concerns of performing an evaluation and management service by telephone and the availability of in person appointments. I also discussed with the patient that there may be a patient responsible charge related to this service. The patient expressed understanding and agreed to proceed.  Location patient: home, [place] Location provider: work or home office Participants present for the call: patient, provider Patient did not have a visit with me in the prior 7 days to address this/these issue(s).   History of Present Illness:  Acute telemedicine visit for nasal congestion: -Onset: several weeks now -Symptoms include: sinus congestion, now a little worse the last few days, sinus discomfort, feeling a little off -Denies: fevers, CP, SOB, NVD, body aches, loss of taste or smell -no known sick contacts -Has tried: allergy pill, started flonase - but has not resolved the issues -Pertinent past medical history: hx of sinusitis and vertigo -Pertinent medication allergies:nkda  -COVID-19 vaccine status: fully vaccinated for covid + booster, also had flu shot   Observations/Objective: Patient sounds cheerful and well on the phone. I do not appreciate any SOB. Speech and thought processing are grossly intact. Patient reported vitals:  Assessment and Plan:  Sinus congestion  -we discussed possible serious and likely etiologies, options for evaluation and workup, limitations of telemedicine visit vs in person visit, treatment, treatment risks and precautions. Pt prefers to treat via telemedicine empirically rather than in person at this moment. Query sinusitis given duration of symptoms vs other. She opted for trial of empiric treatment with Augmentin  875 bid x 7-10 days. Did advised to seek prompt in person care if worsening, new symptoms arise, or if is not improving with treatment. Advised of options for inperson care in case PCP office not available.   Follow Up Instructions:  I did not refer this patient for an OV with me in the next 24 hours for this/these issue(s).  I discussed the assessment and treatment plan with the patient. The patient was provided an opportunity to ask questions and all were answered. The patient agreed with the plan and demonstrated an understanding of the instructions.   I spent 15 minutes on this encounter.   Lucretia Kern, DO

## 2020-09-15 ENCOUNTER — Telehealth: Payer: Self-pay | Admitting: Internal Medicine

## 2020-09-15 NOTE — Telephone Encounter (Signed)
   Patient reports she is still having hip pain since last visit, getting worse.  Medication not helping the pain. She wants to know if injection sould be considered Seeking advice

## 2020-09-16 ENCOUNTER — Telehealth: Payer: Self-pay | Admitting: Internal Medicine

## 2020-09-16 ENCOUNTER — Encounter: Payer: Self-pay | Admitting: Internal Medicine

## 2020-09-16 ENCOUNTER — Ambulatory Visit (INDEPENDENT_AMBULATORY_CARE_PROVIDER_SITE_OTHER): Payer: Medicare Other | Admitting: Internal Medicine

## 2020-09-16 ENCOUNTER — Other Ambulatory Visit: Payer: Self-pay

## 2020-09-16 DIAGNOSIS — M7062 Trochanteric bursitis, left hip: Secondary | ICD-10-CM | POA: Diagnosis not present

## 2020-09-16 MED ORDER — METHYLPREDNISOLONE ACETATE 80 MG/ML IJ SUSP
80.0000 mg | Freq: Once | INTRAMUSCULAR | Status: AC
  Administered 2020-09-16: 80 mg via INTRAMUSCULAR

## 2020-09-16 MED ORDER — LIDOCAINE-EPINEPHRINE 2 %-1:100000 IJ SOLN
3.0000 mL | Freq: Once | INTRAMUSCULAR | Status: AC
  Administered 2020-09-16: 3 mL

## 2020-09-16 NOTE — Telephone Encounter (Signed)
We can give her a shot. Thx

## 2020-09-16 NOTE — Progress Notes (Signed)
d 

## 2020-09-16 NOTE — Telephone Encounter (Signed)
Pt brought husband to his appt. MD gave injection at that time,,/lmb

## 2020-09-16 NOTE — Telephone Encounter (Signed)
Patient was wondering if she could get a call back in regards to her lab work in July of this year. She can be reached at [phone].

## 2020-09-16 NOTE — Assessment & Plan Note (Signed)
Recurrent left trochanteric bursitis.  Severe pain.  Options to treat discussed.  The patient would like to have an injection.  See the procedure.

## 2020-09-16 NOTE — Progress Notes (Signed)
Subjective:  Patient ID: Tina Bird, female    DOB: [birth year]  Age: 69 y.o. MRN: [id number]  CC: No chief complaint on file.   HPI The patient was worked in today due to severe pain in the left hip.  It feels like bursitis that she had a year ago.  Steroid shot helped a lot last time we needed.  Outpatient Medications Prior to Visit  Medication Sig Dispense Refill  . acyclovir (ZOVIRAX) 400 MG tablet TAKE 1 TABLET(400 MG) BY MOUTH THREE TIMES DAILY FOR 10 DAYS 21 tablet 3  . amoxicillin-clavulanate (AUGMENTIN) 875-125 MG tablet Take 1 tablet by mouth 2 (two) times daily. 20 tablet 0  . aspirin EC 81 MG tablet Take 81 mg by mouth daily.    . Cholecalciferol 1000 UNITS tablet Take 1,000 Units by mouth daily.      [name] escitalopram (LEXAPRO) 5 MG tablet Take 1 tablet (5 mg total) by mouth daily. Annual appt due in [place] must see provider for future refills 90 tablet 3  . fexofenadine (ALLEGRA) 180 MG tablet Take 180 mg by mouth daily.    . fluticasone (FLONASE) 50 MCG/ACT nasal spray Place 2 sprays into both nostrils daily. 16 g 6  . meloxicam (MOBIC) 7.5 MG tablet Take 1 tablet (7.5 mg total) by mouth daily. 30 tablet 0  . omeprazole (PRILOSEC) 40 MG capsule Take 1 capsule (40 mg total) by mouth daily. Annual appt due in [place] must see provider for future refills 90 capsule 3  . tiZANidine (ZANAFLEX) 4 MG tablet Take 0.5-1 tablets (2-4 mg total) by mouth every 8 (eight) hours as needed for muscle spasms. 30 tablet 1  . triamterene-hydrochlorothiazide (MAXZIDE-25) 37.5-25 MG tablet TAKE 1/2 TABLET BY MOUTH DAILY 45 tablet 3  . vitamin B-12 (CYANOCOBALAMIN) 1000 MCG tablet Take 1,000 mcg by mouth daily.     No facility-administered medications prior to visit.    ROS: Review of Systems  Musculoskeletal: Positive for gait problem.  Severe left hip pain  Objective:  There were no vitals taken for this visit.  BP Readings from Last 3 Encounters:  09/01/20 (!) 142/78  07/16/20  134/74  05/01/20 122/76    Wt Readings from Last 3 Encounters:  09/01/20 156 lb 12.8 oz (71.1 kg)  07/16/20 157 lb (71.2 kg)  05/01/20 156 lb (70.8 kg)    Physical Exam Constitutional:      Appearance: Normal appearance.  Musculoskeletal:        General: Tenderness present.  Neurological:     Mental Status: She is oriented to person, place, and time.     Gait: Gait abnormal.   Left hip is tender in the area of the left trochanteric bursa more on the posterior aspect   Procedure Note :     Procedure : Joint Injection, L   hip   Indication:  Trochanteric bursitis with refractory  chronic pain.   Risks including unsuccessful procedure , bleeding, infection, bruising, skin atrophy, "steroid flare-up" and others were explained to the patient in detail as well as the benefits. Informed consent was obtained verbally.  Tthe patient was placed in a comfortable lateral decubitus position. The point of maximal tenderness was identified. Skin was prepped with Betadine and alcohol. Then, a 5 cc syringe with a 1.5 inch long 24-gauge needle was used for a bursa injection.. The needle was advanced  Into the bursa. I injected the bursa with 4 mL of 2% lidocaine and 80 mg of Depo-Medrol .  Band-Aid was applied.   Tolerated well. Complications: None. Good pain relief following the procedure.   Postprocedure instructions :    A Band-Aid should be left on for 12 hours. Injection therapy is not a cure itself. It is used in conjunction with other modalities. You can use nonsteroidal anti-inflammatories like ibuprofen , hot and cold compresses. Rest is recommended in the next 24 hours. You need to report immediately  if fever, chills or any signs of infection develop.    Lab Results  Component Value Date   WBC 4.6 05/07/2019   HGB 12.9 05/07/2019   HCT 38.8 05/07/2019   PLT 215.0 05/07/2019   GLUCOSE 77 05/07/2019   CHOL 222 (H) 05/01/2020   TRIG 291 (H) 05/01/2020   HDL 38 (L) 05/01/2020    LDLDIRECT 119.0 05/07/2019   LDLCALC 139 (H) 05/01/2020   ALT 17 05/07/2019   AST 16 05/07/2019   NA 140 05/07/2019   K 3.5 05/07/2019   CL 101 05/07/2019   CREATININE 0.87 05/07/2019   BUN 15 05/07/2019   CO2 31 05/07/2019   TSH 1.12 05/07/2019   HGBA1C 6.0 12/07/2016    CT CARDIAC SCORING  Addendum Date: 06/13/2019   ADDENDUM REPORT: 06/13/2019 11:49 CLINICAL DATA:  Risk stratification EXAM: Coronary Calcium Score TECHNIQUE: The patient was scanned on a [employer] scanner. Axial non-contrast 3 mm slices were carried out through the heart. The data set was analyzed on a dedicated work station and scored using the [place]. FINDINGS: Non-cardiac: See separate report from [facility]. Ascending Aorta: Normal Caliber.  No calcifications. Pericardium: Normal Coronary arteries: Normal coronary origins. IMPRESSION: Coronary calcium score of 0. This was 0 percentile for age and sex matched control. Fransico Him Electronically Signed   By: Fransico Him   On: 06/13/2019 11:49   Result Date: 06/13/2019 EXAM: OVER-READ INTERPRETATION  CT CHEST The following report is an over-read performed by radiologist Dr. Vinnie Langton of [facility], [place] on 06/13/2019. This over-read does not include interpretation of cardiac or coronary anatomy or pathology. The coronary calcium score interpretation by the cardiologist is attached. COMPARISON:  None. FINDINGS: Multiple small calcified granulomas in the lungs bilaterally. Within the visualized portions of the thorax there are no suspicious appearing pulmonary nodules or masses, there is no acute consolidative airspace disease, no pleural effusions, no pneumothorax and no lymphadenopathy. Visualized portions of the upper abdomen demonstrates mild diffuse low attenuation throughout the visualized hepatic parenchyma. There are no aggressive appearing lytic or blastic lesions noted in the visualized portions of the skeleton. IMPRESSION: 1. Hepatic  steatosis. 2. Old granulomatous disease, as above. Electronically Signed: By: Vinnie Langton M.D. On: 06/13/2019 11:07    Assessment & Plan:   Diagnoses and all orders for this visit:  Trochanteric bursitis of left hip -     methylPREDNISolone acetate (DEPO-MEDROL) injection 80 mg -     lidocaine-EPINEPHrine (XYLOCAINE W/EPI) 2 %-1:100000 (with pres) injection 3 mL     Meds ordered this encounter  Medications  . methylPREDNISolone acetate (DEPO-MEDROL) injection 80 mg  . lidocaine-EPINEPHrine (XYLOCAINE W/EPI) 2 %-1:100000 (with pres) injection 3 mL     Follow-up: No follow-ups on file.  Walker Kehr, MD

## 2020-09-16 NOTE — Telephone Encounter (Signed)
I can refer her to see Ortho.  Does she have an orthopedist?  Thanks

## 2020-09-16 NOTE — Telephone Encounter (Signed)
Patient called and said that she does not have an orthopedist. She was also wondering if Dr. Alain Marion would be able to give her a Cortizone shot.

## 2020-09-16 NOTE — Telephone Encounter (Signed)
Pt was here at ov w/husband. MD discuss labs.Marland KitchenJohny Chess

## 2020-09-21 ENCOUNTER — Other Ambulatory Visit: Payer: Self-pay | Admitting: Internal Medicine

## 2020-09-27 ENCOUNTER — Other Ambulatory Visit: Payer: Self-pay | Admitting: Internal Medicine

## 2020-10-15 DIAGNOSIS — M9902 Segmental and somatic dysfunction of thoracic region: Secondary | ICD-10-CM | POA: Diagnosis not present

## 2020-10-15 DIAGNOSIS — M5136 Other intervertebral disc degeneration, lumbar region: Secondary | ICD-10-CM | POA: Diagnosis not present

## 2020-10-15 DIAGNOSIS — M5134 Other intervertebral disc degeneration, thoracic region: Secondary | ICD-10-CM | POA: Diagnosis not present

## 2020-10-15 DIAGNOSIS — M9905 Segmental and somatic dysfunction of pelvic region: Secondary | ICD-10-CM | POA: Diagnosis not present

## 2020-10-15 DIAGNOSIS — M5431 Sciatica, right side: Secondary | ICD-10-CM | POA: Diagnosis not present

## 2020-10-15 DIAGNOSIS — M9903 Segmental and somatic dysfunction of lumbar region: Secondary | ICD-10-CM | POA: Diagnosis not present

## 2020-10-23 DIAGNOSIS — M5134 Other intervertebral disc degeneration, thoracic region: Secondary | ICD-10-CM | POA: Diagnosis not present

## 2020-10-23 DIAGNOSIS — M9905 Segmental and somatic dysfunction of pelvic region: Secondary | ICD-10-CM | POA: Diagnosis not present

## 2020-10-23 DIAGNOSIS — M5431 Sciatica, right side: Secondary | ICD-10-CM | POA: Diagnosis not present

## 2020-10-23 DIAGNOSIS — M9903 Segmental and somatic dysfunction of lumbar region: Secondary | ICD-10-CM | POA: Diagnosis not present

## 2020-10-23 DIAGNOSIS — M9902 Segmental and somatic dysfunction of thoracic region: Secondary | ICD-10-CM | POA: Diagnosis not present

## 2020-10-23 DIAGNOSIS — M5136 Other intervertebral disc degeneration, lumbar region: Secondary | ICD-10-CM | POA: Diagnosis not present

## 2020-10-30 ENCOUNTER — Encounter: Payer: Self-pay | Admitting: Internal Medicine

## 2020-10-30 DIAGNOSIS — M9905 Segmental and somatic dysfunction of pelvic region: Secondary | ICD-10-CM | POA: Diagnosis not present

## 2020-10-30 DIAGNOSIS — M5134 Other intervertebral disc degeneration, thoracic region: Secondary | ICD-10-CM | POA: Diagnosis not present

## 2020-10-30 DIAGNOSIS — M5431 Sciatica, right side: Secondary | ICD-10-CM | POA: Diagnosis not present

## 2020-10-30 DIAGNOSIS — M5136 Other intervertebral disc degeneration, lumbar region: Secondary | ICD-10-CM | POA: Diagnosis not present

## 2020-10-30 DIAGNOSIS — M9903 Segmental and somatic dysfunction of lumbar region: Secondary | ICD-10-CM | POA: Diagnosis not present

## 2020-10-30 DIAGNOSIS — M9902 Segmental and somatic dysfunction of thoracic region: Secondary | ICD-10-CM | POA: Diagnosis not present

## 2020-12-15 ENCOUNTER — Ambulatory Visit: Payer: Medicare Other

## 2020-12-25 ENCOUNTER — Telehealth: Payer: Self-pay | Admitting: Internal Medicine

## 2020-12-25 MED ORDER — ESCITALOPRAM OXALATE 5 MG PO TABS: 5.0000 mg | ORAL_TABLET | Freq: Every day | ORAL | 1 refills | Status: DC

## 2020-12-25 NOTE — Telephone Encounter (Signed)
Patient has called saying she needs a refill for escitalopram (LEXAPRO) 5 MG tablet .  She has scheduled an appointment in July for her physical

## 2020-12-25 NOTE — Telephone Encounter (Signed)
Refill has been sent to [facility]../lmb 

## 2020-12-26 NOTE — Telephone Encounter (Signed)
Thank you, AP

## 2020-12-29 ENCOUNTER — Ambulatory Visit: Payer: Medicare Other

## 2021-01-02 ENCOUNTER — Encounter: Payer: Self-pay | Admitting: Family

## 2021-01-02 ENCOUNTER — Ambulatory Visit (INDEPENDENT_AMBULATORY_CARE_PROVIDER_SITE_OTHER): Payer: Medicare Other | Admitting: Family

## 2021-01-02 ENCOUNTER — Other Ambulatory Visit: Payer: Self-pay

## 2021-01-02 VITALS — BP 138/72 | HR 85 | Temp 98.2°F | Ht 65.0 in | Wt 156.6 lb

## 2021-01-02 DIAGNOSIS — R42 Dizziness and giddiness: Secondary | ICD-10-CM | POA: Diagnosis not present

## 2021-01-02 DIAGNOSIS — R519 Headache, unspecified: Secondary | ICD-10-CM | POA: Diagnosis not present

## 2021-01-02 DIAGNOSIS — H538 Other visual disturbances: Secondary | ICD-10-CM | POA: Diagnosis not present

## 2021-01-02 LAB — URINALYSIS, ROUTINE W REFLEX MICROSCOPIC
Bilirubin Urine: NEGATIVE
Hgb urine dipstick: NEGATIVE
Ketones, ur: NEGATIVE
Nitrite: NEGATIVE
RBC / HPF: NONE SEEN (ref 0–?)
Specific Gravity, Urine: 1.015 (ref 1.000–1.030)
Total Protein, Urine: NEGATIVE
Urine Glucose: NEGATIVE
Urobilinogen, UA: 0.2 (ref 0.0–1.0)
pH: 7 (ref 5.0–8.0)

## 2021-01-02 LAB — CBC WITH DIFFERENTIAL/PLATELET
Basophils Absolute: 0.1 10*3/uL (ref 0.0–0.1)
Basophils Relative: 1.2 % (ref 0.0–3.0)
Eosinophils Absolute: 0.2 10*3/uL (ref 0.0–0.7)
Eosinophils Relative: 4.5 % (ref 0.0–5.0)
HCT: 38.6 % (ref 36.0–46.0)
Hemoglobin: 13.2 g/dL (ref 12.0–15.0)
Lymphocytes Relative: 30.2 % (ref 12.0–46.0)
Lymphs Abs: 1.4 10*3/uL (ref 0.7–4.0)
MCHC: 34.2 g/dL (ref 30.0–36.0)
MCV: 85.2 fl (ref 78.0–100.0)
Monocytes Absolute: 0.2 10*3/uL (ref 0.1–1.0)
Monocytes Relative: 5.2 % (ref 3.0–12.0)
Neutro Abs: 2.8 10*3/uL (ref 1.4–7.7)
Neutrophils Relative %: 58.9 % (ref 43.0–77.0)
Platelets: 226 10*3/uL (ref 150.0–400.0)
RBC: 4.53 Mil/uL (ref 3.87–5.11)
RDW: 13.6 % (ref 11.5–15.5)
WBC: 4.7 10*3/uL (ref 4.0–10.5)

## 2021-01-02 LAB — COMPREHENSIVE METABOLIC PANEL
ALT: 22 U/L (ref 0–35)
AST: 22 U/L (ref 0–37)
Albumin: 4.2 g/dL (ref 3.5–5.2)
Alkaline Phosphatase: 70 U/L (ref 39–117)
BUN: 19 mg/dL (ref 6–23)
CO2: 33 mEq/L — ABNORMAL HIGH (ref 19–32)
Calcium: 9.3 mg/dL (ref 8.4–10.5)
Chloride: 99 mEq/L (ref 96–112)
Creatinine, Ser: 0.86 mg/dL (ref 0.40–1.20)
GFR: 68.02 mL/min (ref >60.00)
Glucose, Bld: 117 mg/dL — ABNORMAL HIGH (ref 70–99)
Potassium: 3.5 mEq/L (ref 3.5–5.1)
Sodium: 139 mEq/L (ref 135–145)
Total Bilirubin: 0.3 mg/dL (ref 0.2–1.2)
Total Protein: 7 g/dL (ref 6.0–8.3)

## 2021-01-02 LAB — TSH: TSH: 2.08 u[IU]/mL (ref 0.35–4.50)

## 2021-01-02 LAB — HEMOGLOBIN A1C: Hgb A1c MFr Bld: 6 % (ref 4.6–6.5)

## 2021-01-02 MED ORDER — KETOROLAC TROMETHAMINE 30 MG/ML IJ SOLN
30.0000 mg | Freq: Once | INTRAMUSCULAR | Status: AC
Start: 2021-01-02 — End: 2021-01-02
  Administered 2021-01-02: 30 mg via INTRAMUSCULAR

## 2021-01-02 MED ORDER — AMOXICILLIN-POT CLAVULANATE 875-125 MG PO TABS: 1.0000 | ORAL_TABLET | Freq: Two times a day (BID) | ORAL | 0 refills | Status: AC

## 2021-01-02 NOTE — Progress Notes (Signed)
Tina Bird is a 70 y.o. female with the following history as recorded in EpicCare:  Patient Active Problem List   Diagnosis Date Noted  . Right-sided headache 07/16/2020  . Trochanteric bursitis of left hip 03/24/2020  . GERD (gastroesophageal reflux disease) 03/24/2020  . Anxiety disorder 03/24/2020  . Urinary tract infection 01/31/2020  . Osteopenia 01/25/2019  . Dermatochalasis of both upper eyelids 07/06/2018  . Upper respiratory infection 04/05/2018  . Conjunctivitis 04/05/2018  . Sciatic leg pain 01/25/2018  . Pharyngitis 08/30/2017  . Contact dermatitis 01/26/2017  . Neuropathy 12/07/2016  . Essential hypertension, benign 05/25/2016  . Dyslipidemia 05/25/2016  . Acute appendicitis 01/25/2016  . Rash and nonspecific skin eruption 04/22/2015  . Near syncope 11/05/2013  . Heart murmur 11/05/2013  . Well adult exam 10/30/2012  . Internal hemorrhoids with prolapse & pain 07/12/2012  . IBS (irritable bowel syndrome) 07/12/2012  . Diverticulosis of colon with sigmoid stricture 07/12/2012  . Cough 03/07/2012  . Asthmatic bronchitis 03/07/2012  . Hip pain, chronic 12/14/2011  . Glossal erythema 12/14/2011  . Osteoarthritis 07/27/2011  . HIP PAIN 12/02/2010  . MENOPAUSAL SYNDROME 06/02/2010  . DIZZINESS 06/02/2010  . LOW BACK PAIN 05/13/2007    Current Outpatient Medications  Medication Sig Dispense Refill  . acyclovir (ZOVIRAX) 400 MG tablet TAKE 1 TABLET(400 MG) BY MOUTH THREE TIMES DAILY FOR 10 DAYS 21 tablet 3  . amoxicillin-clavulanate (AUGMENTIN) 875-125 MG tablet Take 1 tablet by mouth 2 (two) times daily for 10 days. 20 tablet 0  . escitalopram (LEXAPRO) 5 MG tablet Take 1 tablet (5 mg total) by mouth daily. Must keep august appt for future refills 90 tablet 1  . fexofenadine (ALLEGRA) 180 MG tablet Take 180 mg by mouth daily.    [name] omeprazole (PRILOSEC) 40 MG capsule TAKE 1 CAPSULE BY MOUTH EVERY DAY 90 capsule 3  . triamterene-hydrochlorothiazide  (MAXZIDE-25) 37.5-25 MG tablet TAKE 1/2 TABLET BY MOUTH DAILY 45 tablet 3  . vitamin B-12 (CYANOCOBALAMIN) 1000 MCG tablet Take 1,000 mcg by mouth daily.    [name] aspirin EC 81 MG tablet Take 81 mg by mouth daily. (Patient not taking: Reported on 01/02/2021)    . Cholecalciferol 1000 UNITS tablet Take 1,000 Units by mouth daily.   (Patient not taking: Reported on 01/02/2021)    . fluticasone (FLONASE) 50 MCG/ACT nasal spray Place 2 sprays into both nostrils daily. (Patient not taking: Reported on 01/02/2021) 16 g 6  . tiZANidine (ZANAFLEX) 4 MG tablet Take 0.5-1 tablets (2-4 mg total) by mouth every 8 (eight) hours as needed for muscle spasms. (Patient not taking: Reported on 01/02/2021) 30 tablet 1   No current facility-administered medications for this visit.    Allergies: Poison ivy extract  Past Medical History:  Diagnosis Date  . Asthmatic bronchitis 09-08-12   03-07-12 bronchits x1  . Essential hypertension, benign 05/25/2016  . GERD (gastroesophageal reflux disease)   . LBP (low back pain)   . Melanoma ([place])    Right leg and Right breast  . Menopausal symptoms    Dr. Ree Edman  . Osteoarthritis 07/27/2011   left knee  . Vertigo     Past Surgical History:  Procedure Laterality Date  . BREAST EXCISIONAL BIOPSY Right    skin CA  . BROW LIFT Bilateral 11/30/2018   Procedure: BLEPHAROPLASTY;  Surgeon: Wallace Going, DO;  Location: [facility];  Service: Plastics;  Laterality: Bilateral;  90 min surgery time - per provider  . CHOLECYSTECTOMY  laparoscopic  . COLONOSCOPY  2011  . DILATION AND CURETTAGE OF UTERUS    . LAPAROSCOPIC APPENDECTOMY N/A 01/25/2016   Procedure: APPENDECTOMY LAPAROSCOPIC;  Surgeon: Stark Klein, MD;  Location: [facility];  Service: General;  Laterality: N/A;  . PEXY  09/11/2012   Procedure: PEXY;  Surgeon: Adin Hector, MD;  Location: [facility];  Service: General;  Laterality: N/A;  . TONSILLECTOMY    . TRANSANAL HEMORRHOIDAL DEARTERIALIZATION  09/11/2012    Procedure: TRANSANAL HEMORRHOIDAL DEARTERIALIZATION;  Surgeon: Adin Hector, MD;  Location: [facility];  Service: General;  Laterality: N/A;  [place] hemorrhoidal ligation/pexy  . TUBAL LIGATION      Family History  Problem Relation Age of Onset  . Heart disease Brother   . Diabetes Mother   . Hypertension Mother   . Stroke Sister   . Hypertension Other   . Alzheimer's disease Other     Social History   Tobacco Use  . Smoking status: Former Smoker    Packs/day: 0.50    Years: 30.00    Pack years: 15.00    Types: Cigarettes    Quit date: 10/04/2001    Years since quitting: 19.2  . Smokeless tobacco: Never Used  Substance Use Topics  . Alcohol use: Yes    Alcohol/week: 1.0 standard drink    Types: 1 Glasses of wine per week    Comment: rare wine    Subjective:  Presents with complaints of headache/ sensation of being "off balance" for the past 3-4 weeks; initially thought symptoms were due to mistake with her Lexapro- she had accidentally stopped it without tapering; has now been back for over a week with no improvement; no prior history of migraines/ "not a headache person"- feels like her head "just hurts all over"; also feels that her vision has been off slightly in the past few weeks- is up to date on eye exam/ just got new glasses; no vomiting, no loss of consciousness; no sense of heat beating irregularly or fast, no palpitations;     Objective:  Vitals:   01/02/21 1100  BP: 138/72  Pulse: 85  Temp: 98.2 F (36.8 C)  TempSrc: Oral  SpO2: 97%  Weight: 156 lb 9.6 oz (71 kg)  Height: _0  (1.651 m)    General: Well developed, well nourished, in no acute distress  Skin : Warm and dry.  Head: Normocephalic and atraumatic  Eyes: Sclera and conjunctiva clear; pupils round and reactive to light; extraocular movements intact  Ears: External normal; right canal has dried blood; left canals clear; tympanic membranes congested;  Oropharynx: Pink, supple. No suspicious lesions   Neck: Supple without thyromegaly, adenopathy; no carotid bruits noted Lungs: Respirations unlabored; clear to auscultation bilaterally without wheeze, rales, rhonchi  CVS exam: normal rate and regular rhythm.  Neurologic: Alert and oriented; speech intact; face symmetrical; moves all extremities well; CNII-XII intact without focal deficit   Assessment:  1. Nonintractable headache, unspecified chronicity pattern, unspecified headache type   2. Dizziness   3. Blurred vision     Plan:  ? Etiology; will give Toradol IM 30 mg in office today as symptoms are consistent with migraine; will also go ahead and treat with Augmentin for possible underlying sinus infection; will update labs and check head CT and carotid dopplers as well; to consider MRI as well; follow-up to be determined; strict ER precautions for upcoming weekend;   This visit occurred during the SARS-CoV-2 public health emergency.  Safety protocols were in place,  including screening questions prior to the visit, additional usage of staff PPE, and extensive cleaning of exam room while observing appropriate contact time as indicated for disinfecting solutions.     No follow-ups on file.  Orders Placed This Encounter  Procedures  . CT Head Wo Contrast    Standing Status:   Future    Standing Expiration Date:   01/02/2022    Order Specific Question:   Preferred imaging location?    Answer:   [id number]  . CBC with Differential/Platelet    Standing Status:   Future    Number of Occurrences:   1    Standing Expiration Date:   01/02/2022  . Comp Met (CMET)    Standing Status:   Future    Number of Occurrences:   1    Standing Expiration Date:   01/02/2022  . TSH    Standing Status:   Future    Number of Occurrences:   1    Standing Expiration Date:   01/02/2022  . Hemoglobin A1c    Standing Status:   Future    Number of Occurrences:   1    Standing Expiration Date:   01/02/2022  . Urinalysis    Standing Status:   Future     Number of Occurrences:   1    Standing Expiration Date:   01/02/2022    Requested Prescriptions   Signed Prescriptions Disp Refills  . amoxicillin-clavulanate (AUGMENTIN) 875-125 MG tablet 20 tablet 0    Sig: Take 1 tablet by mouth 2 (two) times daily for 10 days.

## 2021-01-02 NOTE — Progress Notes (Signed)
Toradol 30mg  injection given on LUOQ.  Pt tolerated well.

## 2021-01-05 ENCOUNTER — Telehealth: Payer: Self-pay | Admitting: Internal Medicine

## 2021-01-05 NOTE — Telephone Encounter (Signed)
Patient called and is requesting a call back in regards to recent lab work. She seen FNP on 01-02-21. Please advise

## 2021-01-05 NOTE — Telephone Encounter (Signed)
Pt called and results given to her.

## 2021-01-13 ENCOUNTER — Other Ambulatory Visit: Payer: Self-pay

## 2021-01-13 ENCOUNTER — Ambulatory Visit (HOSPITAL_COMMUNITY)
Admission: RE | Admit: 2021-01-13 | Discharge: 2021-01-13 | Disposition: A | Discharge status: Home / Self Care | Payer: Medicare Other | Source: Ambulatory Visit | Attending: Cardiovascular Disease | Admitting: Cardiovascular Disease

## 2021-01-13 DIAGNOSIS — R42 Dizziness and giddiness: Secondary | ICD-10-CM | POA: Diagnosis not present

## 2021-01-14 NOTE — Telephone Encounter (Signed)
Patient returned call about results.  

## 2021-01-14 NOTE — Telephone Encounter (Signed)
I have given the pt a call back and relayed the results once again. She stated she still has a lingering headache but it is much better.

## 2021-01-17 ENCOUNTER — Ambulatory Visit
Admission: RE | Admit: 2021-01-17 | Discharge: 2021-01-17 | Disposition: A | Discharge status: Home / Self Care | Payer: Medicare Other | Source: Ambulatory Visit | Attending: Family | Admitting: Family

## 2021-01-17 ENCOUNTER — Other Ambulatory Visit: Payer: Self-pay

## 2021-01-17 DIAGNOSIS — R410 Disorientation, unspecified: Secondary | ICD-10-CM | POA: Diagnosis not present

## 2021-01-17 DIAGNOSIS — R519 Headache, unspecified: Secondary | ICD-10-CM | POA: Diagnosis not present

## 2021-01-19 DIAGNOSIS — H02834 Dermatochalasis of left upper eyelid: Secondary | ICD-10-CM | POA: Diagnosis not present

## 2021-01-19 DIAGNOSIS — Z961 Presence of intraocular lens: Secondary | ICD-10-CM | POA: Diagnosis not present

## 2021-01-19 DIAGNOSIS — H04123 Dry eye syndrome of bilateral lacrimal glands: Secondary | ICD-10-CM | POA: Diagnosis not present

## 2021-01-19 DIAGNOSIS — H43811 Vitreous degeneration, right eye: Secondary | ICD-10-CM | POA: Diagnosis not present

## 2021-01-19 DIAGNOSIS — R519 Headache, unspecified: Secondary | ICD-10-CM | POA: Diagnosis not present

## 2021-01-19 DIAGNOSIS — H353131 Nonexudative age-related macular degeneration, bilateral, early dry stage: Secondary | ICD-10-CM | POA: Diagnosis not present

## 2021-01-19 DIAGNOSIS — H02831 Dermatochalasis of right upper eyelid: Secondary | ICD-10-CM | POA: Diagnosis not present

## 2021-01-19 LAB — HM DIABETES EYE EXAM

## 2021-01-23 ENCOUNTER — Encounter: Payer: Self-pay | Admitting: Internal Medicine

## 2021-01-26 ENCOUNTER — Telehealth: Payer: Self-pay | Admitting: Internal Medicine

## 2021-01-26 MED ORDER — TRIAMTERENE-HCTZ 37.5-25 MG PO TABS: 0.5000 | ORAL_TABLET | Freq: Every day | ORAL | 0 refills | Status: DC

## 2021-01-26 MED ORDER — OMEPRAZOLE 40 MG PO CPDR: DELAYED_RELEASE_CAPSULE | ORAL | 0 refills | Status: DC

## 2021-01-26 NOTE — Telephone Encounter (Signed)
1.Medication Requested: omeprazole (PRILOSEC) 40 MG capsule  triamterene-hydrochlorothiazide (MAXZIDE-25) 37.5-25 MG tablet   2. Pharmacy (Name, [facility]): [facility], [address] AT [place]  3. On Med List: yes   4. Last Visit with PCP: 09-16-20  5. Next visit date with PCP: 05-04-21   Agent: Please be advised that RX refills may take up to 3 business days. We ask that you follow-up with your pharmacy.

## 2021-01-26 NOTE — Telephone Encounter (Signed)
Sent 90 day script to [facility] until cpx in July.Marland KitchenJohny Bird

## 2021-01-28 ENCOUNTER — Other Ambulatory Visit: Payer: Self-pay | Admitting: Internal Medicine

## 2021-02-13 ENCOUNTER — Telehealth: Payer: Self-pay | Admitting: Internal Medicine

## 2021-02-13 NOTE — Progress Notes (Signed)
  Chronic Care Management   Note  02/13/2021 Name: Macala Baldonado MRN: [id number] DOB: [birth year]  Jasmene Goswami Shore-Wright is a 70 y.o. year old female who is a primary care patient of Plotnikov, Evie Lacks, MD. I reached out to Ward by phone today in response to a referral sent by Ms. Armen Pickup Shore-Wright's PCP, Plotnikov, Evie Lacks, MD.   Ms. Hillery Aldo was given information about Chronic Care Management services today including:  1. CCM service includes personalized support from designated clinical staff supervised by her physician, including individualized plan of care and coordination with other care providers 2. 24/7 contact phone numbers for assistance for urgent and routine care needs. 3. Service will only be billed when office clinical staff spend 20 minutes or more in a month to coordinate care. 4. Only one practitioner may furnish and bill the service in a calendar month. 5. The patient may stop CCM services at any time (effective at the end of the month) by phone call to the office staff.   Patient agreed to services and verbal consent obtained.   Follow up plan:   [place]

## 2021-02-17 NOTE — Progress Notes (Deleted)
Chronic Care Management Pharmacy Note  02/17/2021 Name:  Tina Bird MRN:  [id number] DOB:  1951-09-20  Subjective: Tina Bird is an 70 y.o. year old female who is a primary patient of Plotnikov, Evie Lacks, MD.  The CCM team was consulted for assistance with disease management and care coordination needs.    Engaged with patient face to face for initial visit in response to provider referral for pharmacy case management and/or care coordination services.   Consent to Services:  The patient was given the following information about Chronic Care Management services today, agreed to services, and gave verbal consent: 1. CCM service includes personalized support from designated clinical staff supervised by the primary care provider, including individualized plan of care and coordination with other care providers 2. 24/7 contact phone numbers for assistance for urgent and routine care needs. 3. Service will only be billed when office clinical staff spend 20 minutes or more in a month to coordinate care. 4. Only one practitioner may furnish and bill the service in a calendar month. 5.The patient may stop CCM services at any time (effective at the end of the month) by phone call to the office staff. 6. The patient will be responsible for cost sharing (co-pay) of up to 20% of the service fee (after annual deductible is met). Patient agreed to services and consent obtained.  Patient Care Team: Plotnikov, Evie Lacks, MD as PCP - General Carlean Purl Ofilia Neas, MD as Consulting Physician (Gastroenterology) Tomasa Blase, [facility] (Pharmacist) Tomasa Blase, [facility] as Pharmacist (Pharmacist)  Recent office visits: 01/02/2021 - Jodi Mourning - OV for migraine / possible sinus infection - given toradol injection and prescription for Augmentin  09/16/2020 - PCP visit - steroid injection in left hip  09/01/2020 - PCP visit - left hip pain - started on meloxicam and tizanidine  05/01/2020 - PCP visit  - steroid injection of left hip   Recent consult visits: [facility] visits: None in previous 6 months  Objective:  Lab Results  Component Value Date   CREATININE 0.86 01/02/2021   BUN 19 01/02/2021   GFR 68.02 01/02/2021   GFRNONAA >60 11/28/2018   GFRAA >60 11/28/2018   NA 139 01/02/2021   K 3.5 01/02/2021   CALCIUM 9.3 01/02/2021   CO2 33 (H) 01/02/2021   GLUCOSE 117 (H) 01/02/2021    Lab Results  Component Value Date/Time   HGBA1C 6.0 01/02/2021 11:45 AM   HGBA1C 6.0 12/07/2016 11:45 AM   GFR 68.02 01/02/2021 11:45 AM   GFR 64.44 05/07/2019 09:43 AM    Last diabetic Eye exam:  Lab Results  Component Value Date/Time   HMDIABEYEEXA No Retinopathy 01/19/2021 12:00 AM    Last diabetic Foot exam:  No results found for: HMDIABFOOTEX   Lab Results  Component Value Date   CHOL 222 (H) 05/01/2020   HDL 38 (L) 05/01/2020   LDLCALC 139 (H) 05/01/2020   LDLDIRECT 119.0 05/07/2019   TRIG 291 (H) 05/01/2020   CHOLHDL 5.8 (H) 05/01/2020    Hepatic Function Latest Ref Rng & Units 01/02/2021 05/07/2019 08/15/2017  Total Protein 6.0 - 8.3 g/dL 7.0 6.9 7.2  Albumin 3.5 - 5.2 g/dL 4.2 4.3 4.2  AST 0 - 37 U/L 22 16 18   ALT 0 - 35 U/L 22 17 17   Alk Phosphatase 39 - 117 U/L 70 73 73  Total Bilirubin 0.2 - 1.2 mg/dL 0.3 0.3 0.4  Bilirubin, Direct 0.0 - 0.3 mg/dL - 0.1 0.1  Lab Results  Component Value Date/Time   TSH 2.08 01/02/2021 11:45 AM   TSH 1.12 05/07/2019 09:43 AM    CBC Latest Ref Rng & Units 01/02/2021 05/07/2019 08/15/2017  WBC 4.0 - 10.5 K/uL 4.7 4.6 5.0  Hemoglobin 12.0 - 15.0 g/dL 13.2 12.9 13.2  Hematocrit 36.0 - 46.0 % 38.6 38.8 39.9  Platelets 150.0 - 400.0 K/uL 226.0 215.0 234.0    Lab Results  Component Value Date/Time   VD25OH 41 06/10/2010 06:09 PM    Clinical ASCVD: No  The 10-year ASCVD risk score Mikey Bussing DC Jr., et al., 2013) is: 17.4%   Values used to calculate the score:     Age: 6 years     Sex: Female     Is Non-Hispanic African  American: No     Diabetic: No     Tobacco smoker: No     Systolic Blood Pressure: 707 mmHg     Is BP treated: Yes     HDL Cholesterol: 38 mg/dL     Total Cholesterol: 222 mg/dL    Depression screen [facility] 2/9 01/31/2020 10/24/2018 08/09/2018  Decreased Interest 0 0 0  Down, Depressed, Hopeless 0 0 1  PHQ - 2 Score 0 0 1  Altered sleeping - - 0  Tired, decreased energy - - 0  Change in appetite - - 0  Feeling bad or failure about yourself  - - 0  Trouble concentrating - - 0  Moving slowly or fidgety/restless - - 0  Suicidal thoughts - - 0  PHQ-9 Score - - 1      Social History   Tobacco Use  Smoking Status Former Smoker  . Packs/day: 0.50  . Years: 30.00  . Pack years: 15.00  . Types: Cigarettes  . Quit date: 10/04/2001  . Years since quitting: 19.3  Smokeless Tobacco Never Used   BP Readings from Last 3 Encounters:  01/02/21 138/72  09/01/20 (!) 142/78  07/16/20 134/74   Pulse Readings from Last 3 Encounters:  01/02/21 85  09/01/20 78  07/16/20 76   Wt Readings from Last 3 Encounters:  01/02/21 156 lb 9.6 oz (71 kg)  09/01/20 156 lb 12.8 oz (71.1 kg)  07/16/20 157 lb (71.2 kg)   BMI Readings from Last 3 Encounters:  01/02/21 26.06 kg/m  09/01/20 26.09 kg/m  07/16/20 26.13 kg/m    Assessment/Interventions: Review of patient past medical history, allergies, medications, health status, including review of consultants reports, laboratory and other test data, was performed as part of comprehensive evaluation and provision of chronic care management services.   SDOH:  (Social Determinants of Health) assessments and interventions performed: {yes/no:20286}  SDOH Screenings   Alcohol Screen: Not on file  Depression (EML5-4): Not on file  Financial Resource Strain: Not on file  Food Insecurity: Not on file  Housing: Not on file  Physical Activity: Not on file  Social Connections: Not on file  Stress: Not on file  Tobacco Use: Medium Risk  . Smoking Tobacco Use:  Former Smoker  . Smokeless Tobacco Use: Never Used  Transportation Needs: Not on file    CCM Care Plan  Allergies  Allergen Reactions  . Poison Ivy Extract     Medications Reviewed Today    Reviewed by Marrian Salvage, FNP (Family Nurse Practitioner) on 01/02/21 at 1120  Med List Status: <None>  Medication Order Taking? Sig Documenting Provider Last Dose Status Informant  acyclovir (ZOVIRAX) 400 MG tablet [id number] Yes TAKE 1 TABLET(400 MG) BY MOUTH  THREE TIMES DAILY FOR 10 DAYS Plotnikov, Evie Lacks, MD Taking Active   aspirin EC 81 MG tablet [id number] No Take 81 mg by mouth daily.  Patient not taking: Reported on 01/02/2021   [provider] Not Taking Active   Cholecalciferol 1000 UNITS tablet [id number] No Take 1,000 Units by mouth daily.    Patient not taking: Reported on 01/02/2021   [provider] Not Taking Active Self  escitalopram (LEXAPRO) 5 MG tablet [id number] Yes Take 1 tablet (5 mg total) by mouth daily. Must keep august appt for future refills Plotnikov, Evie Lacks, MD Taking Active   fexofenadine (ALLEGRA) 180 MG tablet [id number] Yes Take 180 mg by mouth daily. [provider] Taking Active   fluticasone (FLONASE) 50 MCG/ACT nasal spray [id number] No Place 2 sprays into both nostrils daily.  Patient not taking: Reported on 01/02/2021   Marrian Salvage, Clifford Not Taking Active        Patient not taking:      Discontinued 01/02/21 1120 (Completed Course)   omeprazole (PRILOSEC) 40 MG capsule [id number] Yes TAKE 1 CAPSULE BY MOUTH EVERY DAY Plotnikov, Evie Lacks, MD Taking Active   tiZANidine (ZANAFLEX) 4 MG tablet [id number] No Take 0.5-1 tablets (2-4 mg total) by mouth every 8 (eight) hours as needed for muscle spasms.  Patient not taking: Reported on 01/02/2021   Plotnikov, Evie Lacks, MD Not Taking Active   triamterene-hydrochlorothiazide (MAXZIDE-25) 37.5-25 MG tablet [id number] Yes TAKE 1/2 TABLET BY MOUTH DAILY Plotnikov, Evie Lacks, MD  Taking Active   vitamin B-12 (CYANOCOBALAMIN) 1000 MCG tablet [id number] Yes Take 1,000 mcg by mouth daily. [provider] Taking Active Self          Patient Active Problem List   Diagnosis Date Noted  . Right-sided headache 07/16/2020  . Trochanteric bursitis of left hip 03/24/2020  . GERD (gastroesophageal reflux disease) 03/24/2020  . Anxiety disorder 03/24/2020  . Urinary tract infection 01/31/2020  . Osteopenia 01/25/2019  . Dermatochalasis of both upper eyelids 07/06/2018  . Upper respiratory infection 04/05/2018  . Conjunctivitis 04/05/2018  . Sciatic leg pain 01/25/2018  . Pharyngitis 08/30/2017  . Contact dermatitis 01/26/2017  . Neuropathy 12/07/2016  . Essential hypertension, benign 05/25/2016  . Dyslipidemia 05/25/2016  . Acute appendicitis 01/25/2016  . Rash and nonspecific skin eruption 04/22/2015  . Near syncope 11/05/2013  . Heart murmur 11/05/2013  . Well adult exam 10/30/2012  . Internal hemorrhoids with prolapse & pain 07/12/2012  . IBS (irritable bowel syndrome) 07/12/2012  . Diverticulosis of colon with sigmoid stricture 07/12/2012  . Cough 03/07/2012  . Asthmatic bronchitis 03/07/2012  . Hip pain, chronic 12/14/2011  . Glossal erythema 12/14/2011  . Osteoarthritis 07/27/2011  . HIP PAIN 12/02/2010  . MENOPAUSAL SYNDROME 06/02/2010  . DIZZINESS 06/02/2010  . LOW BACK PAIN 05/13/2007    Immunization History  Administered Date(s) Administered  . Influenza Split 07/27/2011, 08/29/2012  . Influenza Whole 06/02/2010  . Influenza, High Dose Seasonal PF 07/15/2016, 07/11/2017  . Influenza,inj,Quad PF,6+ Mos 09/12/2013, 07/26/2014, 07/02/2015, 06/25/2018, 06/19/2019  . Influenza-Unspecified 06/21/2020  . Moderna Sars-Covid-2 Vaccination 11/19/2019, 12/18/2019, 07/27/2020  . Pneumococcal Conjugate-13 08/09/2018  . Pneumococcal Polysaccharide-23 05/07/2016  . Td 06/04/2009  . Tdap 05/01/2020  . Zoster 08/11/2011    Conditions to be  addressed/monitored:  Hypertension, Hyperlipidemia, GERD, Anxiety, Osteoarthritis and Allergic Rhinitis  There are no care plans that you recently modified to display for this patient.     Medication Assistance: None required.  Patient affirms current  coverage meets needs.  Patient's preferred pharmacy is:  [facility] [place], [address] [place] [address] Phone: [phone] Fax: [phone]   Uses pill box? {Yes or If no, why not?:20788} Pt endorses ***% compliance  Care Plan and Follow Up Patient Decision:  {FOLLOWUP:24991}  Plan: {CM FOLLOW UP PLAN:25073}  ***  Current Barriers:  . {pharmacybarriers:24917}  Pharmacist Clinical Goal(s):  [name] Patient will {PHARMACYGOALCHOICES:24921} through collaboration with PharmD and provider.   Interventions: . 1:1 collaboration with Plotnikov, Evie Lacks, MD regarding development and update of comprehensive plan of care as evidenced by provider attestation and co-signature . Inter-disciplinary care team collaboration (see longitudinal plan of care) . Comprehensive medication review performed; medication list updated in electronic medical record  Hypertension (BP goal <140/90) -{US controlled/uncontrolled:25276} -Current treatment: . Triamterene/HCTZ 37.61m-244mg - 1 tablet daily  -Medications previously tried: ***  -Current home readings: *** -Current dietary habits: *** -Current exercise habits: *** -{ACTIONS;DENIES/REPORTS:21021675::"Denies"} hypotensive/hypertensive symptoms -Educated on {CCM BP Counseling:25124} -Counseled to monitor BP at home ***, document, and provide log at future appointments -{CCMPHARMDINTERVENTION:25122}  Hyperlipidemia: (LDL goal < 100) -{US controlled/uncontrolled:25276} -Current treatment: . ASA 862mdaily  -Medications previously tried: Atorvastatin, Crestor   -Current dietary patterns: *** -Current exercise habits: *** -Educated  on {CCM HLD Counseling:25126} -{CCMPHARMDINTERVENTION:25122}  Depression/Anxiety (Goal: Disease control / prevention of anxiety attacks ) -{US controlled/uncontrolled:25276} -Current treatment: . Escitalopram 44m53maily  -Medications previously tried/failed: citalopram / Venlafaxine XR  -PHQ9: *** -GAD7: *** -Connected with *** for mental health support -Educated on {CCM mental health counseling:25127} -{CCMPHARMDINTERVENTION:25122}  Osteoarthritis / Chronic Hip Pain (Goal: Pain control / prevention of flares) -{US controlled/uncontrolled:25276} -Current treatment  . Tizanidine - 2-4mg50mh prn -Medications previously tried: ***  -{CCMPHARMDINTERVENTION:25122}   Allergic Rhinitis (Goal: Flare prevention / action plan for flare) -{US controlled/uncontrolled:25276} -Current treatment  . Fexofenadine 180mg144mly  . Fluticasone 50mcg2mt - 2 sprays into each nostril daily  -Medications previously tried: ***  -{CCMPHARMDINTERVENTION:25122}  GERD (Goal: disease control / prevention of flares) -{US controlled/uncontrolled:25276} -Current treatment  . Omeprazole 40mg d61m  -Medications previously tried: ***  -{CCMPHARMDINTERVENTION:25122}   Patient Goals/Self-Care Activities . Patient will:  - {pharmacypatientgoals:24919}  Follow Up Plan: {CM FOLLOW UP PLAN:22PJSR:15945}

## 2021-02-19 ENCOUNTER — Ambulatory Visit (INDEPENDENT_AMBULATORY_CARE_PROVIDER_SITE_OTHER): Payer: Medicare Other

## 2021-02-19 ENCOUNTER — Ambulatory Visit: Payer: Medicare Other

## 2021-02-19 ENCOUNTER — Other Ambulatory Visit: Payer: Self-pay

## 2021-02-19 VITALS — BP 120/66 | HR 75 | Temp 98.0°F | Resp 16 | Ht 65.0 in | Wt 157.4 lb

## 2021-02-19 DIAGNOSIS — Z0000 Encounter for general adult medical examination without abnormal findings: Secondary | ICD-10-CM | POA: Diagnosis not present

## 2021-02-19 NOTE — Patient Instructions (Signed)
[name] , Thank you for taking time to come for your Medicare Wellness Visit. I appreciate your ongoing commitment to your health goals. Please review the following plan we discussed and let me know if I can assist you in the future.   Screening recommendations/referrals: Colonoscopy: 07/28/2010; due every 10 years Mammogram: 07/19/2018; due every 2 years Bone Density: 10/05/2018; due every 2 years Recommended yearly ophthalmology/optometry visit for glaucoma screening and checkup Recommended yearly dental visit for hygiene and checkup  Vaccinations: Influenza vaccine: 06/21/2020 Pneumococcal vaccine: 05/07/2016, 08/09/2018 Tdap vaccine: 05/01/2020; due every 10 years Shingles vaccine: Please call your insurance company to determine your out of pocket expense for the Shingrix vaccine. You may receive this vaccine at your local pharmacy. Covid-19: 11/09/2019, 12/18/2019, 07/27/2020, 02/15/2021  Advanced directives: Advance directive discussed with you today. I have provided a copy for you to complete at home and have notarized. Once this is complete please bring a copy in to our office so we can scan it into your chart.  Conditions/risks identified: Yes; Reviewed health maintenance screenings with patient today and relevant education, vaccines, and/or referrals were provided. Please continue to do your personal lifestyle choices by: daily care of teeth and gums, regular physical activity (goal should be 5 days a week for 30 minutes), eat a healthy diet, avoid tobacco and drug use, limiting any alcohol intake, taking a low-dose aspirin (if not allergic or have been advised by your provider otherwise) and taking vitamins and minerals as recommended by your provider. Continue doing brain stimulating activities (puzzles, reading, adult coloring books, staying active) to keep memory sharp. Continue to eat heart healthy diet (full of fruits, vegetables, whole grains, lean protein, water--limit salt, fat,  and sugar intake) and increase physical activity as tolerated.  Next appointment: Please schedule your next Medicare Wellness Visit with your Nurse Health Advisor in 1 year by calling [phone].   Preventive Care 31 Years and Older, Female Preventive care refers to lifestyle choices and visits with your health care provider that can promote health and wellness. What does preventive care include?  A yearly physical exam. This is also called an annual well check.  Dental exams once or twice a year.  Routine eye exams. Ask your health care provider how often you should have your eyes checked.  Personal lifestyle choices, including:  Daily care of your teeth and gums.  Regular physical activity.  Eating a healthy diet.  Avoiding tobacco and drug use.  Limiting alcohol use.  Practicing safe sex.  Taking low-dose aspirin every day.  Taking vitamin and mineral supplements as recommended by your health care provider. What happens during an annual well check? The services and screenings done by your health care provider during your annual well check will depend on your age, overall health, lifestyle risk factors, and family history of disease. Counseling  Your health care provider may ask you questions about your:  Alcohol use.  Tobacco use.  Drug use.  Emotional well-being.  Home and relationship well-being.  Sexual activity.  Eating habits.  History of falls.  Memory and ability to understand (cognition).  Work and work Statistician.  Reproductive health. Screening  You may have the following tests or measurements:  Height, weight, and BMI.  Blood pressure.  Lipid and cholesterol levels. These may be checked every 5 years, or more frequently if you are over 60 years old.  Skin check.  Lung cancer screening. You may have this screening every year starting at age 23 if you  have a 30-pack-year history of smoking and currently smoke or have quit within the  past 15 years.  Fecal occult blood test (FOBT) of the stool. You may have this test every year starting at age 64.  Flexible sigmoidoscopy or colonoscopy. You may have a sigmoidoscopy every 5 years or a colonoscopy every 10 years starting at age 65.  Hepatitis C blood test.  Hepatitis B blood test.  Sexually transmitted disease (STD) testing.  Diabetes screening. This is done by checking your blood sugar (glucose) after you have not eaten for a while (fasting). You may have this done every 1-3 years.  Bone density scan. This is done to screen for osteoporosis. You may have this done starting at age 70.  Mammogram. This may be done every 1-2 years. Talk to your health care provider about how often you should have regular mammograms. Talk with your health care provider about your test results, treatment options, and if necessary, the need for more tests. Vaccines  Your health care provider may recommend certain vaccines, such as:  Influenza vaccine. This is recommended every year.  Tetanus, diphtheria, and acellular pertussis (Tdap, Td) vaccine. You may need a Td booster every 10 years.  Zoster vaccine. You may need this after age 18.  Pneumococcal 13-valent conjugate (PCV13) vaccine. One dose is recommended after age 21.  Pneumococcal polysaccharide (PPSV23) vaccine. One dose is recommended after age 93. Talk to your health care provider about which screenings and vaccines you need and how often you need them. This information is not intended to replace advice given to you by your health care provider. Make sure you discuss any questions you have with your health care provider. Document Released: 10/17/2015 Document Revised: 06/09/2016 Document Reviewed: 07/22/2015 Elsevier Interactive Patient Education  2017 [place] Prevention in the Home Falls can cause injuries. They can happen to people of all ages. There are many things you can do to make your home safe and to  help prevent falls. What can I do on the outside of my home?  Regularly fix the edges of walkways and driveways and fix any cracks.  Remove anything that might make you trip as you walk through a door, such as a raised step or threshold.  Trim any bushes or trees on the path to your home.  Use bright outdoor lighting.  Clear any walking paths of anything that might make someone trip, such as rocks or tools.  Regularly check to see if handrails are loose or broken. Make sure that both sides of any steps have handrails.  Any raised decks and porches should have guardrails on the edges.  Have any leaves, snow, or ice cleared regularly.  Use sand or salt on walking paths during winter.  Clean up any spills in your garage right away. This includes oil or grease spills. What can I do in the bathroom?  Use night lights.  Install grab bars by the toilet and in the tub and shower. Do not use towel bars as grab bars.  Use non-skid mats or decals in the tub or shower.  If you need to sit down in the shower, use a plastic, non-slip stool.  Keep the floor dry. Clean up any water that spills on the floor as soon as it happens.  Remove soap buildup in the tub or shower regularly.  Attach bath mats securely with double-sided non-slip rug tape.  Do not have throw rugs and other things on the floor that can  make you trip. What can I do in the bedroom?  Use night lights.  Make sure that you have a light by your bed that is easy to reach.  Do not use any sheets or blankets that are too big for your bed. They should not hang down onto the floor.  Have a firm chair that has side arms. You can use this for support while you get dressed.  Do not have throw rugs and other things on the floor that can make you trip. What can I do in the kitchen?  Clean up any spills right away.  Avoid walking on wet floors.  Keep items that you use a lot in easy-to-reach places.  If you need to reach  something above you, use a strong step stool that has a grab bar.  Keep electrical cords out of the way.  Do not use floor polish or wax that makes floors slippery. If you must use wax, use non-skid floor wax.  Do not have throw rugs and other things on the floor that can make you trip. What can I do with my stairs?  Do not leave any items on the stairs.  Make sure that there are handrails on both sides of the stairs and use them. Fix handrails that are broken or loose. Make sure that handrails are as long as the stairways.  Check any carpeting to make sure that it is firmly attached to the stairs. Fix any carpet that is loose or worn.  Avoid having throw rugs at the top or bottom of the stairs. If you do have throw rugs, attach them to the floor with carpet tape.  Make sure that you have a light switch at the top of the stairs and the bottom of the stairs. If you do not have them, ask someone to add them for you. What else can I do to help prevent falls?  Wear shoes that:  Do not have high heels.  Have rubber bottoms.  Are comfortable and fit you well.  Are closed at the toe. Do not wear sandals.  If you use a stepladder:  Make sure that it is fully opened. Do not climb a closed stepladder.  Make sure that both sides of the stepladder are locked into place.  Ask someone to hold it for you, if possible.  Clearly mark and make sure that you can see:  Any grab bars or handrails.  First and last steps.  Where the edge of each step is.  Use tools that help you move around (mobility aids) if they are needed. These include:  Canes.  Walkers.  Scooters.  Crutches.  Turn on the lights when you go into a dark area. Replace any light bulbs as soon as they burn out.  Set up your furniture so you have a clear path. Avoid moving your furniture around.  If any of your floors are uneven, fix them.  If there are any pets around you, be aware of where they are.  Review  your medicines with your doctor. Some medicines can make you feel dizzy. This can increase your chance of falling. Ask your doctor what other things that you can do to help prevent falls. This information is not intended to replace advice given to you by your health care provider. Make sure you discuss any questions you have with your health care provider. Document Released: 07/17/2009 Document Revised: 02/26/2016 Document Reviewed: 10/25/2014 Elsevier Interactive Patient Education  2017 [employer].

## 2021-02-19 NOTE — Progress Notes (Unsigned)
Chronic Care Management Pharmacy Note  02/19/2021 Name:  Tina Bird MRN:  [id number] DOB:  06/09/1951  Subjective: Tina Bird is an 70 y.o. year old female who is a primary patient of Plotnikov, Evie Lacks, MD.  The CCM team was consulted for assistance with disease management and care coordination needs.    Engaged with patient face to face for initial visit in response to provider referral for pharmacy case management and/or care coordination services.   Consent to Services:  The patient was given the following information about Chronic Care Management services today, agreed to services, and gave verbal consent: 1. CCM service includes personalized support from designated clinical staff supervised by the primary care provider, including individualized plan of care and coordination with other care providers 2. 24/7 contact phone numbers for assistance for urgent and routine care needs. 3. Service will only be billed when office clinical staff spend 20 minutes or more in a month to coordinate care. 4. Only one practitioner may furnish and bill the service in a calendar month. 5.The patient may stop CCM services at any time (effective at the end of the month) by phone call to the office staff. 6. The patient will be responsible for cost sharing (co-pay) of up to 20% of the service fee (after annual deductible is met). Patient agreed to services and consent obtained.  Patient Care Team: Plotnikov, Evie Lacks, MD as PCP - General Carlean Purl Ofilia Neas, MD as Consulting Physician (Gastroenterology) Delice Bison, Darnelle Maffucci, [facility] (Pharmacist) Tomasa Blase, [facility] as Pharmacist (Pharmacist) Warden Fillers, MD as Consulting Physician (Ophthalmology)  Recent office visits: 01/02/2021 - Cresenciano Lick FNP - presented for HA - given toradol injection 09/16/2020 - PCP visit- presented with left hip pain - steroid injection given  09/01/2020 - PCP visit- left hip pain - steroid injection  given 07/16/2020 - PCP visit - vertigo and HA  - continue IBU, and meclizine  05/01/2020 - PCP visit-  Left hip pain - steroid injection given   Recent consult visits: 10/15/2020, 10/23/2020, 10/30/2020 - Chiropractic Medicine  -Dr. Kathlen Mody and [facility] visits: None in previous 6 months  Objective:  Lab Results  Component Value Date   CREATININE 0.86 01/02/2021   BUN 19 01/02/2021   GFR 68.02 01/02/2021   GFRNONAA >60 11/28/2018   GFRAA >60 11/28/2018   NA 139 01/02/2021   K 3.5 01/02/2021   CALCIUM 9.3 01/02/2021   CO2 33 (H) 01/02/2021   GLUCOSE 117 (H) 01/02/2021    Lab Results  Component Value Date/Time   HGBA1C 6.0 01/02/2021 11:45 AM   HGBA1C 6.0 12/07/2016 11:45 AM   GFR 68.02 01/02/2021 11:45 AM   GFR 64.44 05/07/2019 09:43 AM    Last diabetic Eye exam:  Lab Results  Component Value Date/Time   HMDIABEYEEXA No Retinopathy 01/19/2021 12:00 AM    Last diabetic Foot exam:  No results found for: HMDIABFOOTEX   Lab Results  Component Value Date   CHOL 222 (H) 05/01/2020   HDL 38 (L) 05/01/2020   LDLCALC 139 (H) 05/01/2020   LDLDIRECT 119.0 05/07/2019   TRIG 291 (H) 05/01/2020   CHOLHDL 5.8 (H) 05/01/2020    Hepatic Function Latest Ref Rng & Units 01/02/2021 05/07/2019 08/15/2017  Total Protein 6.0 - 8.3 g/dL 7.0 6.9 7.2  Albumin 3.5 - 5.2 g/dL 4.2 4.3 4.2  AST 0 - 37 U/L _0 ALT 0 - 35 U/L _1 Alk Phosphatase 39 -  117 U/L 70 73 73  Total Bilirubin 0.2 - 1.2 mg/dL 0.3 0.3 0.4  Bilirubin, Direct 0.0 - 0.3 mg/dL - 0.1 0.1    Lab Results  Component Value Date/Time   TSH 2.08 01/02/2021 11:45 AM   TSH 1.12 05/07/2019 09:43 AM    CBC Latest Ref Rng & Units 01/02/2021 05/07/2019 08/15/2017  WBC 4.0 - 10.5 K/uL 4.7 4.6 5.0  Hemoglobin 12.0 - 15.0 g/dL 13.2 12.9 13.2  Hematocrit 36.0 - 46.0 % 38.6 38.8 39.9  Platelets 150.0 - 400.0 K/uL 226.0 215.0 234.0    Lab Results  Component Value Date/Time   VD25OH 41 06/10/2010 06:09 PM     Clinical ASCVD: {YES/NO:21197} The 10-year ASCVD risk score Mikey Bussing DC Jr., et al., 2013) is: 13.4%   Values used to calculate the score:     Age: 78 years     Sex: Female     Is Non-Hispanic African American: No     Diabetic: No     Tobacco smoker: No     Systolic Blood Pressure: 831 mmHg     Is BP treated: Yes     HDL Cholesterol: 38 mg/dL     Total Cholesterol: 222 mg/dL    Depression screen [facility] 2/9 02/19/2021 01/31/2020 10/24/2018  Decreased Interest 0 0 0  Down, Depressed, Hopeless 0 0 0  PHQ - 2 Score 0 0 0  Altered sleeping - - -  Tired, decreased energy - - -  Change in appetite - - -  Feeling bad or failure about yourself  - - -  Trouble concentrating - - -  Moving slowly or fidgety/restless - - -  Suicidal thoughts - - -  PHQ-9 Score - - -     ***Other: (CHADS2VASc if Afib, MMRC or CAT for COPD, ACT, DEXA)  Social History   Tobacco Use  Smoking Status Former Smoker  . Packs/day: 0.50  . Years: 30.00  . Pack years: 15.00  . Types: Cigarettes  . Quit date: 10/04/2001  . Years since quitting: 19.3  Smokeless Tobacco Never Used   BP Readings from Last 3 Encounters:  02/19/21 120/66  01/02/21 138/72  09/01/20 (!) 142/78   Pulse Readings from Last 3 Encounters:  02/19/21 75  01/02/21 85  09/01/20 78   Wt Readings from Last 3 Encounters:  02/19/21 157 lb 6.4 oz (71.4 kg)  01/02/21 156 lb 9.6 oz (71 kg)  09/01/20 156 lb 12.8 oz (71.1 kg)   BMI Readings from Last 3 Encounters:  02/19/21 26.19 kg/m  01/02/21 26.06 kg/m  09/01/20 26.09 kg/m    Assessment/Interventions: Review of patient past medical history, allergies, medications, health status, including review of consultants reports, laboratory and other test data, was performed as part of comprehensive evaluation and provision of chronic care management services.   SDOH:  (Social Determinants of Health) assessments and interventions performed: {yes/no:20286}  SDOH Screenings   Alcohol Screen:  Low Risk   . Last Alcohol Screening Score (AUDIT): 0  Depression (PHQ2-9): Low Risk   . PHQ-2 Score: 0  Financial Resource Strain: Low Risk   . Difficulty of Paying Living Expenses: Not hard at all  Food Insecurity: No Food Insecurity  . Worried About Charity fundraiser in the Last Year: Never true  . Ran Out of Food in the Last Year: Never true  Housing: Low Risk   . Last Housing Risk Score: 0  Physical Activity: Sufficiently Active  . Days of Exercise per Week: 5 days  .  Minutes of Exercise per Session: 30 min  Social Connections: Socially Integrated  . Frequency of Communication with Friends and Family: More than three times a week  . Frequency of Social Gatherings with Friends and Family: Once a week  . Attends Religious Services: 1 to 4 times per year  . Active Member of Clubs or Organizations: No  . Attends Archivist Meetings: 1 to 4 times per year  . Marital Status: Married  Stress: No Stress Concern Present  . Feeling of Stress : Not at all  Tobacco Use: Medium Risk  . Smoking Tobacco Use: Former Smoker  . Smokeless Tobacco Use: Never Used  Transportation Needs: No Transportation Needs  . Lack of Transportation (Medical): No  . Lack of Transportation (Non-Medical): No    CCM Care Plan  Allergies  Allergen Reactions  . Poison Ivy Extract     Medications Reviewed Today    Reviewed by Sheral Flow, LPN (Licensed Practical Nurse) on 02/19/21 at 1107  Med List Status: <None>  Medication Order Taking? Sig Documenting Provider Last Dose Status Informant  acyclovir (ZOVIRAX) 400 MG tablet [id number] No TAKE 1 TABLET(400 MG) BY MOUTH THREE TIMES DAILY FOR 10 DAYS Plotnikov, Evie Lacks, MD Taking Active   aspirin EC 81 MG tablet [id number] No Take 81 mg by mouth daily.  Patient not taking: Reported on 01/02/2021   [provider] Not Taking Active   Cholecalciferol 1000 UNITS tablet [id number] No Take 1,000 Units by mouth daily.    Patient not taking:  Reported on 01/02/2021   [provider] Not Taking Active Self  escitalopram (LEXAPRO) 5 MG tablet [id number] No Take 1 tablet (5 mg total) by mouth daily. Must keep august appt for future refills Plotnikov, Evie Lacks, MD Taking Active   fexofenadine (ALLEGRA) 180 MG tablet [id number] No Take 180 mg by mouth daily. [provider] Taking Active   fluticasone (FLONASE) 50 MCG/ACT nasal spray [id number] No Place 2 sprays into both nostrils daily.  Patient not taking: Reported on 01/02/2021   Marrian Salvage, FNP Not Taking Active   omeprazole (PRILOSEC) 40 MG capsule [id number]  TAKE 1 CAPSULE BY MOUTH EVERY DAY Plotnikov, Evie Lacks, MD  Active   tiZANidine (ZANAFLEX) 4 MG tablet [id number] No Take 0.5-1 tablets (2-4 mg total) by mouth every 8 (eight) hours as needed for muscle spasms.  Patient not taking: Reported on 01/02/2021   Plotnikov, Evie Lacks, MD Not Taking Active   triamterene-hydrochlorothiazide (MAXZIDE-25) 37.5-25 MG tablet [id number]  TAKE 1/2 TABLET BY MOUTH DAILY. ANNUAL APPT DUE IN JULY MUST SEE PROVIDER FOR FUTURE REFILLS Plotnikov, Evie Lacks, MD  Active   vitamin B-12 (CYANOCOBALAMIN) 1000 MCG tablet [id number] No Take 1,000 mcg by mouth daily. [provider] Taking Active Self          Patient Active Problem List   Diagnosis Date Noted  . Right-sided headache 07/16/2020  . Trochanteric bursitis of left hip 03/24/2020  . GERD (gastroesophageal reflux disease) 03/24/2020  . Anxiety disorder 03/24/2020  . Urinary tract infection 01/31/2020  . Osteopenia 01/25/2019  . Dermatochalasis of both upper eyelids 07/06/2018  . Upper respiratory infection 04/05/2018  . Conjunctivitis 04/05/2018  . Sciatic leg pain 01/25/2018  . Pharyngitis 08/30/2017  . Contact dermatitis 01/26/2017  . Neuropathy 12/07/2016  . Essential hypertension, benign 05/25/2016  . Dyslipidemia 05/25/2016  . Acute appendicitis 01/25/2016  . Rash and nonspecific skin eruption  04/22/2015  . Near syncope 11/05/2013  . Heart murmur  11/05/2013  . Well adult exam 10/30/2012  . Internal hemorrhoids with prolapse & pain 07/12/2012  . IBS (irritable bowel syndrome) 07/12/2012  . Diverticulosis of colon with sigmoid stricture 07/12/2012  . Cough 03/07/2012  . Asthmatic bronchitis 03/07/2012  . Hip pain, chronic 12/14/2011  . Glossal erythema 12/14/2011  . Osteoarthritis 07/27/2011  . HIP PAIN 12/02/2010  . MENOPAUSAL SYNDROME 06/02/2010  . DIZZINESS 06/02/2010  . LOW BACK PAIN 05/13/2007    Immunization History  Administered Date(s) Administered  . Influenza Split 07/27/2011, 08/29/2012  . Influenza Whole 06/02/2010  . Influenza, High Dose Seasonal PF 07/15/2016, 07/11/2017  . Influenza,inj,Quad PF,6+ Mos 09/12/2013, 07/26/2014, 07/02/2015, 06/25/2018, 06/19/2019  . Influenza-Unspecified 06/21/2020  . Moderna SARS-COV2 Booster Vaccination 02/15/2021  . Moderna Sars-Covid-2 Vaccination 11/19/2019, 12/18/2019, 07/27/2020  . Pneumococcal Conjugate-13 08/09/2018  . Pneumococcal Polysaccharide-23 05/07/2016  . Td 06/04/2009  . Tdap 05/01/2020  . Zoster 08/11/2011    Conditions to be addressed/monitored:  {USCCMDZASSESSMENTOPTIONS:23563}  There are no care plans that you recently modified to display for this patient.     Medication Assistance: {MEDASSISTANCEINFO:25044}  Patient's preferred pharmacy is:  [id number], [address] [place] [address] Phone: [phone] Fax: [phone]   Uses pill box? {Yes or If no, why not?:20788} Pt endorses ***% compliance  Care Plan and Follow Up Patient Decision:  {FOLLOWUP:24991}  Plan: {CM FOLLOW UP PLAN:25073}  ***

## 2021-02-19 NOTE — Progress Notes (Addendum)
Subjective:   Tina Bird is a 70 y.o. female who presents for Medicare Annual (Subsequent) preventive examination.  Review of Systems    No ROS. Medicare Wellness Visit. Additional risk factors are reflected in social history. Cardiac Risk Factors include: advanced age (>57men, >35 women);dyslipidemia;hypertension;family history of premature cardiovascular disease     Objective:    Today's Vitals   02/19/21 1105  BP: 120/66  Pulse: 75  Resp: 16  Temp: 98 F (36.7 C)  SpO2: 96%  Weight: 157 lb 6.4 oz (71.4 kg)  Height: 5\' 5"  (1.651 m)  PainSc: 0-No pain   Body mass index is 26.19 kg/m.  Advanced Directives 02/19/2021 11/23/2018 08/09/2018 04/14/2017 01/25/2016 09/06/2012  Does Patient Have a Medical Advance Directive? No No No No No Patient does not have advance directive  Would patient like information on creating a medical advance directive? No - Patient declined - Yes (ED - Information included in AVS) No - Patient declined - -  Pre-existing out of facility DNR order (yellow form or pink MOST form) - - - - - No    Current Medications (verified) Outpatient Encounter Medications as of 02/19/2021  Medication Sig   triamterene-hydrochlorothiazide (MAXZIDE-25) 37.5-25 MG tablet TAKE 1/2 TABLET BY MOUTH DAILY. ANNUAL APPT DUE IN JULY MUST SEE PROVIDER FOR FUTURE REFILLS   acyclovir (ZOVIRAX) 400 MG tablet TAKE 1 TABLET(400 MG) BY MOUTH THREE TIMES DAILY FOR 10 DAYS   aspirin EC 81 MG tablet Take 81 mg by mouth daily. (Patient not taking: Reported on 01/02/2021)   Cholecalciferol 1000 UNITS tablet Take 1,000 Units by mouth daily.   (Patient not taking: Reported on 01/02/2021)   escitalopram (LEXAPRO) 5 MG tablet Take 1 tablet (5 mg total) by mouth daily. Must keep august appt for future refills   fexofenadine (ALLEGRA) 180 MG tablet Take 180 mg by mouth daily.   fluticasone (FLONASE) 50 MCG/ACT nasal spray Place 2 sprays into both nostrils daily. (Patient not taking:  Reported on 01/02/2021)   omeprazole (PRILOSEC) 40 MG capsule TAKE 1 CAPSULE BY MOUTH EVERY DAY   tiZANidine (ZANAFLEX) 4 MG tablet Take 0.5-1 tablets (2-4 mg total) by mouth every 8 (eight) hours as needed for muscle spasms. (Patient not taking: Reported on 01/02/2021)   vitamin B-12 (CYANOCOBALAMIN) 1000 MCG tablet Take 1,000 mcg by mouth daily.   No facility-administered encounter medications on file as of 02/19/2021.    Allergies (verified) Poison ivy extract   History: Past Medical History:  Diagnosis Date   Asthmatic bronchitis 09-08-12   03-07-12 bronchits x1   Essential hypertension, benign 05/25/2016   GERD (gastroesophageal reflux disease)    LBP (low back pain)    Melanoma (HCC)    Right leg and Right breast   Menopausal symptoms    Dr. Ree Edman   Osteoarthritis 07/27/2011   left knee   Vertigo    Past Surgical History:  Procedure Laterality Date   BREAST EXCISIONAL BIOPSY Right    skin CA   BROW LIFT Bilateral 11/30/2018   Procedure: BLEPHAROPLASTY;  Surgeon: Wallace Going, DO;  Location: [facility];  Service: Plastics;  Laterality: Bilateral;  90 min surgery time - per provider   CHOLECYSTECTOMY     laparoscopic   COLONOSCOPY  2011   DILATION AND CURETTAGE OF UTERUS     LAPAROSCOPIC APPENDECTOMY N/A 01/25/2016   Procedure: APPENDECTOMY LAPAROSCOPIC;  Surgeon: Stark Klein, MD;  Location: [facility];  Service: General;  Laterality: N/A;   PEXY  09/11/2012   Procedure: PEXY;  Surgeon: Adin Hector, MD;  Location: [facility];  Service: General;  Laterality: N/A;   TONSILLECTOMY     TRANSANAL HEMORRHOIDAL DEARTERIALIZATION  09/11/2012   Procedure: TRANSANAL HEMORRHOIDAL DEARTERIALIZATION;  Surgeon: Adin Hector, MD;  Location: [facility];  Service: General;  Laterality: N/A;  [place] hemorrhoidal ligation/pexy   TUBAL LIGATION     Family History  Problem Relation Age of Onset   Heart disease Brother    Diabetes Mother    Hypertension Mother    Stroke Sister     Hypertension Other    Alzheimer's disease Other    Social History   Socioeconomic History   Marital status: Married    Spouse name: Not on file   Number of children: Not on file   Years of education: Not on file   Highest education level: Not on file  Occupational History   Not on file  Tobacco Use   Smoking status: Former Smoker    Packs/day: 0.50    Years: 30.00    Pack years: 15.00    Types: Cigarettes    Quit date: 10/04/2001    Years since quitting: 19.3   Smokeless tobacco: Never Used  Vaping Use   Vaping Use: Never used  Substance and Sexual Activity   Alcohol use: Yes    Alcohol/week: 1.0 standard drink    Types: 1 Glasses of wine per week    Comment: rare wine   Drug use: No   Sexual activity: Yes  Other Topics Concern   Not on file  Social History Narrative   Regular Exercise- no   Social Determinants of Health   Financial Resource Strain: Low Risk    Difficulty of Paying Living Expenses: Not hard at all  Food Insecurity: No Food Insecurity   Worried About Charity fundraiser in the Last Year: Never true   Ran Out of Food in the Last Year: Never true  Transportation Needs: No Transportation Needs   Lack of Transportation (Medical): No   Lack of Transportation (Non-Medical): No  Physical Activity: Sufficiently Active   Days of Exercise per Week: 5 days   Minutes of Exercise per Session: 30 min  Stress: No Stress Concern Present   Feeling of Stress : Not at all  Social Connections: Socially Integrated   Frequency of Communication with Friends and Family: More than three times a week   Frequency of Social Gatherings with Friends and Family: Once a week   Attends Religious Services: 1 to 4 times per year   Active Member of [employer] or Organizations: No   Attends Music therapist: 1 to 4 times per year   Marital Status: Married    Tobacco Counseling Counseling given: Not Answered   Clinical Intake:  Pre-visit preparation completed:  Yes  Pain : No/denies pain Pain Score: 0-No pain     BMI - recorded: 26.19 Nutritional Status: BMI 25 -29 Overweight Nutritional Risks: None Diabetes: No  How often do you need to have someone help you when you read instructions, pamphlets, or other written materials from your doctor or pharmacy?: 1 - Never What is the last grade level you completed in school?: High School Graduate  Diabetic? no  Interpreter Needed?: No  Information entered by :: Lisette Abu, LPN   Activities of Daily Living In your present state of health, do you have any difficulty performing the following activities: 02/19/2021  Hearing? N  Vision? N  Difficulty concentrating  or making decisions? N  Walking or climbing stairs? N  Dressing or bathing? N  Doing errands, shopping? N  Preparing Food and eating ? N  Using the Toilet? N  In the past six months, have you accidently leaked urine? N  Do you have problems with loss of bowel control? N  Managing your Medications? N  Managing your Finances? N  Housekeeping or managing your Housekeeping? N  Some recent data might be hidden    Patient Care Team: Plotnikov, Evie Lacks, MD as PCP - General Carlean Purl Ofilia Neas, MD as Consulting Physician (Gastroenterology) Szabat, Darnelle Maffucci, [facility] (Pharmacist) Delice Bison Darnelle Maffucci, [facility] as Pharmacist (Pharmacist)  Indicate any recent Medical Services you may have received from other than [facility] providers in the past year (date may be approximate).     Assessment:   This is a routine wellness examination for Sadiyah.  Hearing/Vision screen No exam data present  Dietary issues and exercise activities discussed: Current Exercise Habits: Home exercise routine, Type of exercise: walking, Time (Minutes): 30, Frequency (Times/Week): 5, Weekly Exercise (Minutes/Week): 150, Intensity: Moderate, Exercise limited by: None identified  Goals Addressed   None    Depression Screen PHQ 2/9 Scores 02/19/2021 01/31/2020 10/24/2018  08/09/2018 07/24/2018 06/23/2017 05/07/2016  PHQ - 2 Score 0 0 0 1 0 0 0  PHQ- 9 Score - - - 1 0 - -    Fall Risk Fall Risk  02/19/2021 01/02/2021 01/31/2020 10/24/2018 08/09/2018  Falls in the past year? 0 0 0 0 0  Number falls in past yr: 0 0 - 0 -  Injury with Fall? 0 0 - 0 -  Risk for fall due to : No Fall Risks - - - Other (Comment);Impaired balance/gait  Risk for fall due to: Comment - - - - has dizzy spells, has severe dizziness and afraid she will fall in the tub/shower  Follow up Falls evaluation completed Falls evaluation completed Falls evaluation completed Falls evaluation completed -    FALL RISK PREVENTION PERTAINING TO THE HOME:  Any stairs in or around the home? No  If so, are there any without handrails? No  Home free of loose throw rugs in walkways, pet beds, electrical cords, etc? Yes  Adequate lighting in your home to reduce risk of falls? Yes   ASSISTIVE DEVICES UTILIZED TO PREVENT FALLS:  Life alert? No  Use of a cane, walker or w/c? No  Grab bars in the bathroom? Yes  Shower chair or bench in shower? Yes  Elevated toilet seat or a handicapped toilet? No   TIMED UP AND GO:  Was the test performed? No .  Length of time to ambulate 10 feet: 0 sec.   Gait steady and fast without use of assistive device  Cognitive Function: Normal cognitive status assessed by direct observation by this Nurse Health Advisor. No abnormalities found.          Immunizations Immunization History  Administered Date(s) Administered   Influenza Split 07/27/2011, 08/29/2012   Influenza Whole 06/02/2010   Influenza, High Dose Seasonal PF 07/15/2016, 07/11/2017   Influenza,inj,Quad PF,6+ Mos 09/12/2013, 07/26/2014, 07/02/2015, 06/25/2018, 06/19/2019   Influenza-Unspecified 06/21/2020   Moderna SARS-COV2 Booster Vaccination 02/15/2021   Moderna Sars-Covid-2 Vaccination 11/19/2019, 12/18/2019, 07/27/2020   Pneumococcal Conjugate-13 08/09/2018   Pneumococcal Polysaccharide-23 05/07/2016    Td 06/04/2009   Tdap 05/01/2020   Zoster 08/11/2011    TDAP status: Up to date  Flu Vaccine status: Up to date  Pneumococcal vaccine status: Up to date  Covid-19 vaccine status: Completed vaccines  Qualifies for Shingles Vaccine? Yes   Zostavax completed Yes   Shingrix Completed?: No.    Education has been provided regarding the importance of this vaccine. Patient has been advised to call insurance company to determine out of pocket expense if they have not yet received this vaccine. Advised may also receive vaccine at local pharmacy or Health Dept. Verbalized acceptance and understanding.  Screening Tests Health Maintenance  Topic Date Due   MAMMOGRAM  07/19/2020   COLONOSCOPY (Pts 45-60yrs Insurance coverage will need to be confirmed)  07/28/2020   INFLUENZA VACCINE  05/04/2021   TETANUS/TDAP  05/01/2030   DEXA SCAN  Completed   COVID-19 Vaccine  Completed   Hepatitis C Screening  Completed   PNA vac Low Risk Adult  Completed   HPV VACCINES  Aged Out    Health Maintenance  Health Maintenance Due  Topic Date Due   MAMMOGRAM  07/19/2020   COLONOSCOPY (Pts 45-64yrs Insurance coverage will need to be confirmed)  07/28/2020    Colorectal cancer screening: Type of screening: Colonoscopy. Completed 07/28/2010. Repeat every 10 years  Mammogram status: Completed 07/19/2018. Repeat every year  Bone Density status: Completed 10/24/2018. Results reflect: Bone density results: OSTEOPOROSIS. Repeat every 2 years.  Lung Cancer Screening: (Low Dose CT Chest recommended if Age 30-80 years, 30 pack-year currently smoking OR have quit w/in 15years.) does not qualify.   Lung Cancer Screening Referral: no  Additional Screening:  Hepatitis C Screening: does qualify; Completed no  Vision Screening: Recommended annual ophthalmology exams for early detection of glaucoma and other disorders of the eye. Is the patient up to date with their annual eye exam?  Yes  Who is the provider or  what is the name of the office in which the patient attends annual eye exams? Warden Fillers, MD If pt is not established with a provider, would they like to be referred to a provider to establish care? No .   Dental Screening: Recommended annual dental exams for proper oral hygiene  Community Resource Referral / Chronic Care Management: CRR required this visit?  No   CCM required this visit?  No      Plan:     I have personally reviewed and noted the following in the patient's chart:   Medical and social history Use of alcohol, tobacco or illicit drugs  Current medications and supplements including opioid prescriptions.  Functional ability and status Nutritional status Physical activity Advanced directives List of other physicians Hospitalizations, surgeries, and ER visits in previous 12 months Vitals Screenings to include cognitive, depression, and falls Referrals and appointments  In addition, I have reviewed and discussed with patient certain preventive protocols, quality metrics, and best practice recommendations. A written personalized care plan for preventive services as well as general preventive health recommendations were provided to patient.     Sheral Flow, LPN   [id number]   Nurse Notes: n/a   Medical screening examination/treatment/procedure(s) were performed by non-physician practitioner and as supervising physician I was immediately available for consultation/collaboration.  I agree with above. Lew Dawes, MD

## 2021-02-20 ENCOUNTER — Ambulatory Visit (INDEPENDENT_AMBULATORY_CARE_PROVIDER_SITE_OTHER): Payer: Medicare Other

## 2021-02-20 ENCOUNTER — Ambulatory Visit: Payer: Medicare Other

## 2021-02-20 DIAGNOSIS — K219 Gastro-esophageal reflux disease without esophagitis: Secondary | ICD-10-CM

## 2021-02-20 DIAGNOSIS — E785 Hyperlipidemia, unspecified: Secondary | ICD-10-CM | POA: Diagnosis not present

## 2021-02-20 DIAGNOSIS — I10 Essential (primary) hypertension: Secondary | ICD-10-CM | POA: Diagnosis not present

## 2021-02-20 NOTE — Patient Instructions (Addendum)
Visit Information   PATIENT GOALS:  Goals Addressed            This Visit's Progress   . Track and Manage My Blood Pressure-Hypertension       Timeframe:  Long-Range Goal Priority:  Medium Start Date: 02/20/2021                            Expected End Date:  08/23/2021                     Follow Up Date 05/22/2021   - check blood pressure 3 times per week    Why is this important?    You won't feel high blood pressure, but it can still hurt your blood vessels.   High blood pressure can cause heart or kidney problems. It can also cause a stroke.   Making lifestyle changes like losing a little weight or eating less salt will help.   Checking your blood pressure at home and at different times of the day can help to control blood pressure.   If the doctor prescribes medicine remember to take it the way the doctor ordered.   Call the office if you cannot afford the medicine or if there are questions about it.     Notes:        Consent to CCM Services: Ms. Hillery Aldo was given information about Chronic Care Management services today including:  1. CCM service includes personalized support from designated clinical staff supervised by her physician, including individualized plan of care and coordination with other care providers 2. 24/7 contact phone numbers for assistance for urgent and routine care needs. 3. Service will only be billed when office clinical staff spend 20 minutes or more in a month to coordinate care. 4. Only one practitioner may furnish and bill the service in a calendar month. 5. The patient may stop CCM services at any time (effective at the end of the month) by phone call to the office staff. 6. The patient will be responsible for cost sharing (co-pay) of up to 20% of the service fee (after annual deductible is met).  Patient agreed to services and verbal consent obtained.   Patient verbalizes understanding of instructions provided today and agrees to  view in [place].   Telephone follow up appointment with care management team member scheduled for: The patient has been provided with contact information for the care management team and has been advised to call with any health related questions or concerns.   Tomasa Blase, PharmD Clinical Pharmacist, [place]      CLINICAL CARE PLAN: Patient Care Plan: CCM Care plan    Problem Identified: HTN, HLD, Depression, GERD, Allergic Rhinitis   Priority: High  Onset Date: 02/20/2021    Long-Range Goal: Disease Management   Start Date: 02/20/2021  Expected End Date: 08/23/2021  This Visit's Progress: On track  Priority: High  Note:   Current Barriers:  . Unable to independently monitor therapeutic efficacy  Pharmacist Clinical Goal(s):  [name] Patient will achieve adherence to monitoring guidelines and medication adherence to achieve therapeutic efficacy . achieve control of LDL  as evidenced by next lipid panel . maintain control of BP as evidenced by continued BP logs in goal range   through collaboration with PharmD and provider.   Interventions: . 1:1 collaboration with Plotnikov, Evie Lacks, MD regarding development and update of comprehensive plan of care as evidenced by  provider attestation and co-signature . Inter-disciplinary care team collaboration (see longitudinal plan of care) . Comprehensive medication review performed; medication list updated in electronic medical record  Hypertension (BP goal <140/90) -Controlled -Current treatment: . Triamterene-HCTZ - 37.5-25mg - 1/2 tablet daily  -Medications previously tried: n/a  -Current home readings: has not been checking at home - yesterday was 120/66 in office  -Current dietary habits: notes that she watches sodium and caffeine intake  -Current exercise habits: [facility] / gardeningDenies hypotensive/hypertensive symptoms -Educated on BP goals and benefits of medications for prevention of heart attack,  stroke and kidney damage; Daily salt intake goal < 2300 mg; Exercise goal of 150 minutes per week; Symptoms of hypotension and importance of maintaining adequate hydration; -Counseled to monitor BP at home three times weekly, document, and provide log at future appointments -Counseled on diet and exercise extensively Recommended to continue current medication  Hyperlipidemia: (LDL goal < 100) -Not ideally controlled  - Last LDL 139 mg/dL (05/01/2020) -Current treatment: . ASA 108m daily - not currently taking (patinet unsure as to why she stopped taking) -Medications previously tried: atorvastatin 146m  -Current dietary patterns: avoiding excessive fried / fatty foods / red meat consumption -[facility] habits[facility], [facility] as well -Educated on Cholesterol goals;  Benefits of statin for ASCVD risk reduction; Importance of limiting foods high in cholesterol; -Counseled on diet and exercise extensively Recommended for patient to re-trial atorvastatin 102maily - also will restart ASA 35m36mily which she had previously been taking and stopped on her own - denies any issues with medications in the past   Depression/Anxiety (Goal: Mood control / prevention of disease progression) -Controlled -Current treatment: . Escitalopram 5mg 4mly  -Medications previously tried/failed: citalopram, venlafaxine  -PHQ9: 0 -Educated on Benefits of medication for symptom control -Recommended to continue current medication   Allergic Rhinitis (Goal: Allergy control / flare prevention) -Controlled -Current treatment  . Fluticasone 50mcg54mct 1-2 sprays into each nostril daily  . Loratadine 10mg d91m  -Medications previously tried: loratadine, cetirizine   -Recommended to continue current medication   GERD (Goal: Acid control / flare prevention) -Controlled -Current treatment  . Omeprazole 40mg da65m -Medications previously tried: Famotidine  -Counseled on diet  and exercise extensively Recommended to continue current medication  Health Maintenance -Current therapy:  . Acyclovir 400mg - T33m 10 days - prn cold sores  . Cholecalciferol 1000 units daily  . Vit B12 1000mcg dai15m. Magnesium Oxide 400mg daily99mEducated on Herbal supplement research is limited and benefits usually cannot be proven Cost vs benefit of each product must be carefully weighed by individual consumer -Patient is satisfied with current therapy and denies issues -Recommended to continue current medication  Patient Goals/Self-Care Activities . Patient will:  - take medications as prescribed check blood pressure 3 times weekly , document, and provide at future appointments     High Cholesterol  High cholesterol is a condition in which the blood has high levels of a white, waxy substance similar to fat (cholesterol). The liver makes all the cholesterol that the body needs. The human body needs small amounts of cholesterol to help build cells. A person gets extra or excess cholesterol from the food that he or she eats. The blood carries cholesterol from the liver to the rest of the body. If you have high cholesterol, deposits (plaques) may build up on the walls of your arteries. Arteries are the blood  vessels that carry blood away from your heart. These plaques make the arteries narrow and stiff. Cholesterol plaques increase your risk for heart attack and stroke. Work with your health care provider to keep your cholesterol levels in a healthy range. What increases the risk? The following factors may make you more likely to develop this condition:  Eating foods that are high in animal fat (saturated fat) or cholesterol.  Being overweight.  Not getting enough exercise.  A family history of high cholesterol (familial hypercholesterolemia).  Use of tobacco products.  Having diabetes. What are the signs or symptoms? There are no symptoms of this condition. How is  this diagnosed? This condition may be diagnosed based on the results of a blood test.  If you are older than 72 years of age, your health care provider may check your cholesterol levels every 4-6 years.  You may be checked more often if you have high cholesterol or other risk factors for heart disease. The blood test for cholesterol measures:  "Bad" cholesterol, or LDL cholesterol. This is the main type of cholesterol that causes heart disease. The desired level is less than 100 mg/dL.  "Good" cholesterol, or HDL cholesterol. HDL helps protect against heart disease by cleaning the arteries and carrying the LDL to the liver for processing. The desired level for HDL is 60 mg/dL or higher.  Triglycerides. These are fats that your body can store or burn for energy. The desired level is less than 150 mg/dL.  Total cholesterol. This measures the total amount of cholesterol in your blood and includes LDL, HDL, and triglycerides. The desired level is less than 200 mg/dL. How is this treated? This condition may be treated with:  Diet changes. You may be asked to eat foods that have more fiber and less saturated fats or added sugar.  Lifestyle changes. These may include regular exercise, maintaining a healthy weight, and quitting use of tobacco products.  Medicines. These are given when diet and lifestyle changes have not worked. You may be prescribed a statin medicine to help lower your cholesterol levels. Follow these instructions at home: Eating and drinking  Eat a healthy, balanced diet. This diet includes: ? Daily servings of a variety of fresh, frozen, or canned fruits and vegetables. ? Daily servings of whole grain foods that are rich in fiber. ? Foods that are low in saturated fats and trans fats. These include poultry and fish without skin, lean cuts of meat, and low-fat dairy products. ? A variety of fish, especially oily fish that contain omega-3 fatty acids. Aim to eat fish at least 2  times a week.  Avoid foods and drinks that have added sugar.  Use healthy cooking methods, such as roasting, grilling, broiling, baking, poaching, steaming, and stir-frying. Do not fry your food except for stir-frying.   Lifestyle  Get regular exercise. Aim to exercise for a total of 150 minutes a week. Increase your activity level by doing activities such as gardening, walking, and taking the stairs.  Do not use any products that contain nicotine or tobacco, such as cigarettes, e-cigarettes, and chewing tobacco. If you need help quitting, ask your health care provider.   General instructions  Take over-the-counter and prescription medicines only as told by your health care provider.  Keep all follow-up visits as told by your health care provider. This is important. Where to find more information  American Heart Association: www.heart.org  National Heart, Lung, and Blood Institute: https://wilson-eaton.com/ Contact a health care provider if:  You have trouble achieving or maintaining a healthy diet or weight.  You are starting an exercise program.  You are unable to stop smoking. Get help right away if:  You have chest pain.  You have trouble breathing.  You have any symptoms of a stroke. "BE FAST" is an easy way to remember the main warning signs of a stroke: ? B - Balance. Signs are dizziness, sudden trouble walking, or loss of balance. ? E - Eyes. Signs are trouble seeing or a sudden change in vision. ? F - Face. Signs are sudden weakness or numbness of the face, or the face or eyelid drooping on one side. ? A - Arms. Signs are weakness or numbness in an arm. This happens suddenly and usually on one side of the body. ? S - Speech. Signs are sudden trouble speaking, slurred speech, or trouble understanding what people say. ? T - Time. Time to call emergency services. Write down what time symptoms started.  You have other signs of a stroke, such as: ? A sudden, severe headache with  no known cause. ? Nausea or vomiting. ? Seizure. These symptoms may represent a serious problem that is an emergency. Do not wait to see if the symptoms will go away. Get medical help right away. Call your local emergency services (911 in the [place] Do not drive yourself to the hospital. Summary  Cholesterol plaques increase your risk for heart attack and stroke. Work with your health care provider to keep your cholesterol levels in a healthy range.  Eat a healthy, balanced diet, get regular exercise, and maintain a healthy weight.  Do not use any products that contain nicotine or tobacco, such as cigarettes, e-cigarettes, and chewing tobacco.  Get help right away if you have any symptoms of a stroke. This information is not intended to replace advice given to you by your health care provider. Make sure you discuss any questions you have with your health care provider. Document Revised: 08/20/2019 Document Reviewed: 08/20/2019 Elsevier Patient Education  2021 [employer].

## 2021-02-20 NOTE — Progress Notes (Addendum)
Chronic Care Management Pharmacy Note  02/20/2021 Name:  Tina Bird MRN:  [id number] DOB:  May 09, 1951  Subjective: Tina Bird is an 70 y.o. year old female who is a primary patient of Plotnikov, Evie Lacks, MD.  The CCM team was consulted for assistance with disease management and care coordination needs.    Engaged with patient by telephone for initial visit in response to provider referral for pharmacy case management and/or care coordination services.   Consent to Services:  The patient was given the following information about Chronic Care Management services today, agreed to services, and gave verbal consent: 1. CCM service includes personalized support from designated clinical staff supervised by the primary care provider, including individualized plan of care and coordination with other care providers 2. 24/7 contact phone numbers for assistance for urgent and routine care needs. 3. Service will only be billed when office clinical staff spend 20 minutes or more in a month to coordinate care. 4. Only one practitioner may furnish and bill the service in a calendar month. 5.The patient may stop CCM services at any time (effective at the end of the month) by phone call to the office staff. 6. The patient will be responsible for cost sharing (co-pay) of up to 20% of the service fee (after annual deductible is met). Patient agreed to services and consent obtained.  Patient Care Team: Plotnikov, Evie Lacks, MD as PCP - General Carlean Purl Ofilia Neas, MD as Consulting Physician (Gastroenterology) Delice Bison, Darnelle Maffucci, [facility] (Pharmacist) Tomasa Blase, [facility] as Pharmacist (Pharmacist) Warden Fillers, MD as Consulting Physician (Ophthalmology)  Recent office visits: 01/02/2021 - Jodi Mourning FNP - visit for HA - given toradol injection 09/16/2020 - PCP visit - left hip pain - steroid injection given  09/01/2020 - PCP visit - left hip / leg pain - steroid injection given  08/03/2020  - Dr. Quay Burow - HA and vertigo - no changes to medications  05/01/2020 - PCP visit - Left hip pain - steroid injection given     Recent consult visits: 1/12,  1/20, and /27 - Chiropractic Medicine visits   Hospital visits: None in previous 6 months  Objective:  Lab Results  Component Value Date   CREATININE 0.86 01/02/2021   BUN 19 01/02/2021   GFR 68.02 01/02/2021   GFRNONAA >60 11/28/2018   GFRAA >60 11/28/2018   NA 139 01/02/2021   K 3.5 01/02/2021   CALCIUM 9.3 01/02/2021   CO2 33 (H) 01/02/2021   GLUCOSE 117 (H) 01/02/2021    Lab Results  Component Value Date/Time   HGBA1C 6.0 01/02/2021 11:45 AM   HGBA1C 6.0 12/07/2016 11:45 AM   GFR 68.02 01/02/2021 11:45 AM   GFR 64.44 05/07/2019 09:43 AM    Last diabetic Eye exam:  Lab Results  Component Value Date/Time   HMDIABEYEEXA No Retinopathy 01/19/2021 12:00 AM    Last diabetic Foot exam:  No results found for: HMDIABFOOTEX   Lab Results  Component Value Date   CHOL 222 (H) 05/01/2020   HDL 38 (L) 05/01/2020   LDLCALC 139 (H) 05/01/2020   LDLDIRECT 119.0 05/07/2019   TRIG 291 (H) 05/01/2020   CHOLHDL 5.8 (H) 05/01/2020    Hepatic Function Latest Ref Rng & Units 01/02/2021 05/07/2019 08/15/2017  Total Protein 6.0 - 8.3 g/dL 7.0 6.9 7.2  Albumin 3.5 - 5.2 g/dL 4.2 4.3 4.2  AST 0 - 37 U/L 22 16 18   ALT 0 - 35 U/L 22 17 17   Alk Phosphatase 39 -  117 U/L 70 73 73  Total Bilirubin 0.2 - 1.2 mg/dL 0.3 0.3 0.4  Bilirubin, Direct 0.0 - 0.3 mg/dL - 0.1 0.1    Lab Results  Component Value Date/Time   TSH 2.08 01/02/2021 11:45 AM   TSH 1.12 05/07/2019 09:43 AM    CBC Latest Ref Rng & Units 01/02/2021 05/07/2019 08/15/2017  WBC 4.0 - 10.5 K/uL 4.7 4.6 5.0  Hemoglobin 12.0 - 15.0 g/dL 13.2 12.9 13.2  Hematocrit 36.0 - 46.0 % 38.6 38.8 39.9  Platelets 150.0 - 400.0 K/uL 226.0 215.0 234.0    Lab Results  Component Value Date/Time   VD25OH 41 06/10/2010 06:09 PM    Clinical ASCVD: No  The 10-year ASCVD risk score  Mikey Bussing DC Jr., et al., 2013) is: 13.4%   Values used to calculate the score:     Age: 42 years     Sex: Female     Is Non-Hispanic African American: No     Diabetic: No     Tobacco smoker: No     Systolic Blood Pressure: 417 mmHg     Is BP treated: Yes     HDL Cholesterol: 38 mg/dL     Total Cholesterol: 222 mg/dL    Depression screen [facility] 2/9 02/20/2021 02/19/2021 01/31/2020  Decreased Interest 0 0 0  Down, Depressed, Hopeless 1 0 0  PHQ - 2 Score 1 0 0  Altered sleeping - - -  Tired, decreased energy - - -  Change in appetite - - -  Feeling bad or failure about yourself  - - -  Trouble concentrating - - -  Moving slowly or fidgety/restless - - -  Suicidal thoughts - - -  PHQ-9 Score - - -     Social History   Tobacco Use  Smoking Status Former Smoker   Packs/day: 0.50   Years: 30.00   Pack years: 15.00   Types: Cigarettes   Quit date: 10/04/2001   Years since quitting: 19.3  Smokeless Tobacco Never Used   BP Readings from Last 3 Encounters:  02/19/21 120/66  01/02/21 138/72  09/01/20 (!) 142/78   Pulse Readings from Last 3 Encounters:  02/19/21 75  01/02/21 85  09/01/20 78   Wt Readings from Last 3 Encounters:  02/19/21 157 lb 6.4 oz (71.4 kg)  01/02/21 156 lb 9.6 oz (71 kg)  09/01/20 156 lb 12.8 oz (71.1 kg)   BMI Readings from Last 3 Encounters:  02/19/21 26.19 kg/m  01/02/21 26.06 kg/m  09/01/20 26.09 kg/m    Assessment/Interventions: Review of patient past medical history, allergies, medications, health status, including review of consultants reports, laboratory and other test data, was performed as part of comprehensive evaluation and provision of chronic care management services.   SDOH:  (Social Determinants of Health) assessments and interventions performed: Yes   SDOH Screenings   Alcohol Screen: Low Risk    Last Alcohol Screening Score (AUDIT): 0  Depression (PHQ2-9): Low Risk    PHQ-2 Score: 1  Financial Resource Strain: Low Risk     Difficulty of Paying Living Expenses: Not hard at all  Food Insecurity: No Food Insecurity   Worried About Charity fundraiser in the Last Year: Never true   Ran Out of Food in the Last Year: Never true  Housing: Low Risk    Last Housing Risk Score: 0  Physical Activity: Sufficiently Active   Days of Exercise per Week: 5 days   Minutes of Exercise per Session: 30 min  Social Connections: Engineer, building services of Communication with Friends and Family: More than three times a week   Frequency of Social Gatherings with Friends and Family: Once a week   Attends Religious Services: 1 to 4 times per year   Active Member of [employer] or Organizations: No   Attends Music therapist: 1 to 4 times per year   Marital Status: Married  Stress: No Stress Concern Present   Feeling of Stress : Not at all  Tobacco Use: Medium Risk   Smoking Tobacco Use: Former Smoker   Smokeless Tobacco Use: Never Used  Transportation Needs: No Data processing manager (Medical): No   Lack of Transportation (Non-Medical): No    CCM Care Plan  Allergies  Allergen Reactions   Poison Ivy Extract     Medications Reviewed Today     Reviewed by Tomasa Blase, [facility] (Pharmacist) on 02/20/21 at 1154  Med List Status: <None>   Medication Order Taking? Sig Documenting Provider Last Dose Status Informant  acyclovir (ZOVIRAX) 400 MG tablet [id number] Yes TAKE 1 TABLET(400 MG) BY MOUTH THREE TIMES DAILY FOR 10 DAYS Plotnikov, Evie Lacks, MD Taking Active   aspirin EC 81 MG tablet [id number] No Take 81 mg by mouth daily.  Patient not taking: No sig reported   [provider] Not Taking Active   Cholecalciferol 1000 UNITS tablet [id number] Yes Take 1,000 Units by mouth daily. [provider] Taking Active   escitalopram (LEXAPRO) 5 MG tablet [id number] Yes Take 1 tablet (5 mg total) by mouth daily. Must keep august appt for future refills Plotnikov, Evie Lacks, MD Taking  Active   fexofenadine (ALLEGRA) 180 MG tablet [id number] No Take 180 mg by mouth daily.  Patient not taking: Reported on 02/20/2021   [provider] Not Taking Active   fluticasone (FLONASE) 50 MCG/ACT nasal spray [id number] Yes Place 2 sprays into both nostrils daily. Marrian Salvage, FNP Taking Active   loratadine (CLARITIN) 10 MG tablet [id number] Yes Take 10 mg by mouth daily. [provider] Taking Active   magnesium oxide (MAG-OX) 400 MG tablet [id number] Yes Take 400 mg by mouth daily. [provider] Taking Active   omeprazole (PRILOSEC) 40 MG capsule [id number] Yes TAKE 1 CAPSULE BY MOUTH EVERY DAY Plotnikov, Evie Lacks, MD Taking Active   tiZANidine (ZANAFLEX) 4 MG tablet [id number] No Take 0.5-1 tablets (2-4 mg total) by mouth every 8 (eight) hours as needed for muscle spasms.  Patient not taking: No sig reported   Plotnikov, Evie Lacks, MD Not Taking Consider Medication Status and Discontinue   triamterene-hydrochlorothiazide (MAXZIDE-25) 37.5-25 MG tablet [id number] Yes TAKE 1/2 TABLET BY MOUTH DAILY. ANNUAL APPT DUE IN JULY MUST SEE PROVIDER FOR FUTURE REFILLS Plotnikov, Evie Lacks, MD Taking Active   vitamin B-12 (CYANOCOBALAMIN) 1000 MCG tablet [id number] Yes Take 1,000 mcg by mouth daily. [provider] Taking Active Self            Patient Active Problem List   Diagnosis Date Noted   Right-sided headache 07/16/2020   Trochanteric bursitis of left hip 03/24/2020   GERD (gastroesophageal reflux disease) 03/24/2020   Anxiety disorder 03/24/2020   Urinary tract infection 01/31/2020   Osteopenia 01/25/2019   Dermatochalasis of both upper eyelids 07/06/2018   Upper respiratory infection 04/05/2018   Conjunctivitis 04/05/2018   Sciatic leg pain 01/25/2018   Pharyngitis 08/30/2017   Contact dermatitis 01/26/2017   Neuropathy 12/07/2016   Essential hypertension, benign 05/25/2016  Dyslipidemia 05/25/2016   Acute appendicitis 01/25/2016    Rash and nonspecific skin eruption 04/22/2015   Near syncope 11/05/2013   Heart murmur 11/05/2013   Well adult exam 10/30/2012   Internal hemorrhoids with prolapse & pain 07/12/2012   IBS (irritable bowel syndrome) 07/12/2012   Diverticulosis of colon with sigmoid stricture 07/12/2012   Cough 03/07/2012   Asthmatic bronchitis 03/07/2012   Hip pain, chronic 12/14/2011   Glossal erythema 12/14/2011   Osteoarthritis 07/27/2011   HIP PAIN 12/02/2010   MENOPAUSAL SYNDROME 06/02/2010   DIZZINESS 06/02/2010   LOW BACK PAIN 05/13/2007    Immunization History  Administered Date(s) Administered   Influenza Split 07/27/2011, 08/29/2012   Influenza Whole 06/02/2010   Influenza, High Dose Seasonal PF 07/15/2016, 07/11/2017   Influenza,inj,Quad PF,6+ Mos 09/12/2013, 07/26/2014, 07/02/2015, 06/25/2018, 06/19/2019   Influenza-Unspecified 06/21/2020   Moderna SARS-COV2 Booster Vaccination 02/15/2021   Moderna Sars-Covid-2 Vaccination 11/19/2019, 12/18/2019, 07/27/2020   Pneumococcal Conjugate-13 08/09/2018   Pneumococcal Polysaccharide-23 05/07/2016   Td 06/04/2009   Tdap 05/01/2020   Zoster 08/11/2011    Conditions to be addressed/monitored:  Hypertension, Hyperlipidemia, GERD, Depression, Allergic Rhinitis and Chronic left hip pain   Care Plan : CCM Care plan  Updates made by Tomasa Blase, [place] since 02/20/2021 12:00 AM     Problem: HTN, HLD, Depression, GERD, Allergic Rhinitis   Priority: High  Onset Date: 02/20/2021     Long-Range Goal: Disease Management   Start Date: 02/20/2021  Expected End Date: 08/23/2021  This Visit's Progress: On track  Priority: High  Note:   Current Barriers:  Unable to independently monitor therapeutic efficacy  Pharmacist Clinical Goal(s):  Patient will achieve adherence to monitoring guidelines and medication adherence to achieve therapeutic efficacy achieve control of LDL  as evidenced by next lipid panel maintain control of BP as evidenced  by continued BP logs in goal range   through collaboration with PharmD and provider.   Interventions: 1:1 collaboration with Plotnikov, Evie Lacks, MD regarding development and update of comprehensive plan of care as evidenced by provider attestation and co-signature Inter-disciplinary care team collaboration (see longitudinal plan of care) Comprehensive medication review performed; medication list updated in electronic medical record  Hypertension (BP goal <140/90) -Controlled -Current treatment: Triamterene-HCTZ - 37.5-25mg - 1/2 tablet daily  -Medications previously tried: n/a  -Current home readings: has not been checking at home - yesterday was 120/66 in office  -Current dietary habits: notes that she watches sodium and caffeine intake  -Current exercise habits: [facility] / gardeningDenies hypotensive/hypertensive symptoms -Educated on BP goals and benefits of medications for prevention of heart attack, stroke and kidney damage; Daily salt intake goal < 2300 mg; Exercise goal of 150 minutes per week; Symptoms of hypotension and importance of maintaining adequate hydration; -Counseled to monitor BP at home three times weekly, document, and provide log at future appointments -Counseled on diet and exercise extensively Recommended to continue current medication  Hyperlipidemia: (LDL goal < 100) -Not ideally controlled  - Last LDL 139 mg/dL (05/01/2020) -Current treatment: ASA 35m daily - not currently taking (patinet unsure as to why she stopped taking) -Medications previously tried: atorvastatin 110m  -Current dietary patterns: avoiding excessive fried / fatty foods / red meat consumption -[facility] habits[facility], gardening as well -Educated on Cholesterol goals;  Benefits of statin for ASCVD risk reduction; Importance of limiting foods high in cholesterol; -Counseled on diet and exercise extensively Recommended for patient to re-trial  atorvastatin  32m daily - also will restart ASA 851mdaily which she had previously been taking and stopped on her own - denies any issues with medications in the past   Depression/Anxiety (Goal: Mood control / prevention of disease progression) -Controlled -Current treatment: Escitalopram 66m73maily  -Medications previously tried/failed: citalopram, venlafaxine  -PHQ9: 0 -Educated on Benefits of medication for symptom control -Recommended to continue current medication   Allergic Rhinitis (Goal: Allergy control / flare prevention) -Controlled -Current treatment  Fluticasone 45m58m act 1-2 sprays into each nostril daily  Loratadine 10mg60mly  -Medications previously tried: loratadine, cetirizine   -Recommended to continue current medication   GERD (Goal: Acid control / flare prevention) -Controlled -Current treatment  Omeprazole 40mg 46my  -Medications previously tried: Famotidine  -Counseled on diet and exercise extensively Recommended to continue current medication  Health Maintenance -Current therapy:  Acyclovir 400mg -59m x 10 days - prn cold sores  Cholecalciferol 1000 units daily  Vit B12 1000mcg d72m  Magnesium Oxide 400mg dai48m -Educated on Herbal supplement research is limited and benefits usually cannot be proven Cost vs benefit of each product must be carefully weighed by individual consumer -Patient is satisfied with current therapy and denies issues -Recommended to continue current medication  Patient Goals/Self-Care Activities Patient will:  - take medications as prescribed check blood pressure 3 times weekly , document, and provide at future appointments    Medication Assistance: None required.  Patient affirms current coverage meets needs.  [id number] [place] [phone] box?  Yes Pt endorses 100% compliance  Medication Changes: Restart: Atorvastatin 10mg dail466mestart: ASA 81mg daily66mare Plan and Follow Up Patient Decision:  Patient agrees to Care Plan and Follow-up.  Plan: Telephone follow up appointment with care management team member scheduled for:  3 months  and The patient has been provided with contact information for the care management team and has been advised to call with any health related questions or concerns.    Yentl Verge C SzTomasa Blaseinical Pharmacist, Melissa GreLisbonexamination/treatment/procedure(s) were performed by non-physician practitioner and as supervising physician I was immediately available for consultation/collaboration.  I agree with above. Aleksei PloLew Dawes

## 2021-04-22 ENCOUNTER — Telehealth: Payer: Self-pay

## 2021-04-22 ENCOUNTER — Other Ambulatory Visit: Payer: Self-pay | Admitting: Internal Medicine

## 2021-04-22 MED ORDER — ESCITALOPRAM OXALATE 5 MG PO TABS: 5.0000 mg | ORAL_TABLET | Freq: Every day | ORAL | 0 refills | Status: DC

## 2021-04-22 MED ORDER — TRIAMTERENE-HCTZ 37.5-25 MG PO TABS: ORAL_TABLET | ORAL | 0 refills | Status: DC

## 2021-04-22 NOTE — Telephone Encounter (Signed)
Per office policy sent 30 day to local pharmacy until appt.../lmb  

## 2021-04-22 NOTE — Telephone Encounter (Signed)
pt has stated she needs a rx refill for Omeprazole,triamterene-hydrochlorothiazide (MAXZIDE-25) 37.5-25 MG tablet , Escitalopram.  *Pt has stated her pharmacy has informed her she does not have have any refills left on these meds.  **Sent to MA to advise.

## 2021-04-23 DIAGNOSIS — M5136 Other intervertebral disc degeneration, lumbar region: Secondary | ICD-10-CM | POA: Diagnosis not present

## 2021-04-23 DIAGNOSIS — M9902 Segmental and somatic dysfunction of thoracic region: Secondary | ICD-10-CM | POA: Diagnosis not present

## 2021-04-23 DIAGNOSIS — M5417 Radiculopathy, lumbosacral region: Secondary | ICD-10-CM | POA: Diagnosis not present

## 2021-04-23 DIAGNOSIS — M9903 Segmental and somatic dysfunction of lumbar region: Secondary | ICD-10-CM | POA: Diagnosis not present

## 2021-04-23 DIAGNOSIS — M9905 Segmental and somatic dysfunction of pelvic region: Secondary | ICD-10-CM | POA: Diagnosis not present

## 2021-04-24 ENCOUNTER — Other Ambulatory Visit: Payer: Self-pay | Admitting: Internal Medicine

## 2021-04-28 DIAGNOSIS — M9905 Segmental and somatic dysfunction of pelvic region: Secondary | ICD-10-CM | POA: Diagnosis not present

## 2021-04-28 DIAGNOSIS — M5417 Radiculopathy, lumbosacral region: Secondary | ICD-10-CM | POA: Diagnosis not present

## 2021-04-28 DIAGNOSIS — M5136 Other intervertebral disc degeneration, lumbar region: Secondary | ICD-10-CM | POA: Diagnosis not present

## 2021-04-28 DIAGNOSIS — M9902 Segmental and somatic dysfunction of thoracic region: Secondary | ICD-10-CM | POA: Diagnosis not present

## 2021-04-28 DIAGNOSIS — M9903 Segmental and somatic dysfunction of lumbar region: Secondary | ICD-10-CM | POA: Diagnosis not present

## 2021-05-04 ENCOUNTER — Encounter: Payer: Medicare Other | Admitting: Internal Medicine

## 2021-05-08 ENCOUNTER — Other Ambulatory Visit: Payer: Self-pay

## 2021-05-08 ENCOUNTER — Encounter: Payer: Self-pay | Admitting: Internal Medicine

## 2021-05-08 ENCOUNTER — Ambulatory Visit (INDEPENDENT_AMBULATORY_CARE_PROVIDER_SITE_OTHER): Payer: Medicare Other | Admitting: Internal Medicine

## 2021-05-08 ENCOUNTER — Encounter: Payer: Medicare Other | Admitting: Internal Medicine

## 2021-05-08 VITALS — BP 120/82 | HR 73 | Temp 98.1°F | Ht 65.0 in | Wt 156.4 lb

## 2021-05-08 DIAGNOSIS — Z1211 Encounter for screening for malignant neoplasm of colon: Secondary | ICD-10-CM

## 2021-05-08 DIAGNOSIS — R739 Hyperglycemia, unspecified: Secondary | ICD-10-CM | POA: Diagnosis not present

## 2021-05-08 DIAGNOSIS — Z1231 Encounter for screening mammogram for malignant neoplasm of breast: Secondary | ICD-10-CM | POA: Diagnosis not present

## 2021-05-08 DIAGNOSIS — Z0000 Encounter for general adult medical examination without abnormal findings: Secondary | ICD-10-CM | POA: Diagnosis not present

## 2021-05-08 MED ORDER — SHINGRIX 50 MCG/0.5ML IM SUSR: 0.5000 mL | Freq: Once | INTRAMUSCULAR | 1 refills | Status: AC

## 2021-05-08 MED ORDER — OMEPRAZOLE 40 MG PO CPDR: DELAYED_RELEASE_CAPSULE | ORAL | 3 refills | Status: DC

## 2021-05-08 MED ORDER — TRIAMTERENE-HCTZ 37.5-25 MG PO TABS: ORAL_TABLET | ORAL | 3 refills | Status: DC

## 2021-05-08 MED ORDER — ESCITALOPRAM OXALATE 5 MG PO TABS: 5.0000 mg | ORAL_TABLET | Freq: Every day | ORAL | 3 refills | Status: DC

## 2021-05-08 MED ORDER — ACYCLOVIR 400 MG PO TABS: ORAL_TABLET | ORAL | 3 refills | Status: DC

## 2021-05-08 NOTE — Progress Notes (Signed)
Subjective:  Patient ID: Tina Bird, female    DOB: 1950/12/14  Age: 70 y.o. MRN: [id number]  CC: Annual Exam   HPI Tina Bird presents for a well exam  Outpatient Medications Prior to Visit  Medication Sig Dispense Refill   acyclovir (ZOVIRAX) 400 MG tablet TAKE 1 TABLET(400 MG) BY MOUTH THREE TIMES DAILY FOR 10 DAYS 21 tablet 3   B Complex Vitamins (B COMPLEX-B12 PO) Take 1 tablet by mouth daily.     Cholecalciferol 1000 UNITS tablet Take 1,000 Units by mouth daily.     escitalopram (LEXAPRO) 5 MG tablet Take 1 tablet (5 mg total) by mouth daily. Must keep august appt for future refills 30 tablet 0   fluticasone (FLONASE) 50 MCG/ACT nasal spray Place 2 sprays into both nostrils daily. 16 g 6   loratadine (CLARITIN) 10 MG tablet Take 10 mg by mouth daily.     magnesium oxide (MAG-OX) 400 MG tablet Take 400 mg by mouth daily.     omeprazole (PRILOSEC) 40 MG capsule TAKE 1 CAPSULE BY MOUTH EVERY DAY Annual appt is due must see provider for future refills 30 capsule 0   triamterene-hydrochlorothiazide (MAXZIDE-25) 37.5-25 MG tablet TAKE 1/2 TABLET BY MOUTH DAILY. ANNUAL APPT DUE IN JULY MUST SEE PROVIDER FOR FUTURE REFILLS 15 tablet 0   aspirin EC 81 MG tablet Take 81 mg by mouth daily. (Patient not taking: No sig reported)     fexofenadine (ALLEGRA) 180 MG tablet Take 180 mg by mouth daily. (Patient not taking: No sig reported)     tiZANidine (ZANAFLEX) 4 MG tablet Take 0.5-1 tablets (2-4 mg total) by mouth every 8 (eight) hours as needed for muscle spasms. (Patient not taking: No sig reported) 30 tablet 1   vitamin B-12 (CYANOCOBALAMIN) 1000 MCG tablet Take 1,000 mcg by mouth daily. (Patient not taking: Reported on 05/08/2021)     No facility-administered medications prior to visit.    ROS: Review of Systems  Constitutional:  Negative for activity change, appetite change, chills, fatigue and unexpected weight change.  HENT:  Negative for congestion, mouth sores  and sinus pressure.   Eyes:  Negative for visual disturbance.  Respiratory:  Negative for cough and chest tightness.   Gastrointestinal:  Negative for abdominal pain and nausea.  Genitourinary:  Negative for difficulty urinating, frequency and vaginal pain.  Musculoskeletal:  Negative for back pain and gait problem.  Skin:  Negative for pallor and rash.  Neurological:  Positive for headaches. Negative for dizziness, tremors, weakness and numbness.  Psychiatric/Behavioral:  Negative for confusion and sleep disturbance.    Objective:  BP 120/82 (BP Location: Left Arm)   Pulse 73   Temp 98.1 F (36.7 C) (Oral)   Ht '5\' 5"'$  (1.651 m)   Wt 156 lb 6.4 oz (70.9 kg)   SpO2 96%   BMI 26.03 kg/m   BP Readings from Last 3 Encounters:  05/08/21 120/82  02/19/21 120/66  01/02/21 138/72    Wt Readings from Last 3 Encounters:  05/08/21 156 lb 6.4 oz (70.9 kg)  02/19/21 157 lb 6.4 oz (71.4 kg)  01/02/21 156 lb 9.6 oz (71 kg)    Physical Exam Constitutional:      General: She is not in acute distress.    Appearance: She is well-developed.  HENT:     Head: Normocephalic.     Right Ear: External ear normal.     Left Ear: External ear normal.     Nose: Nose normal.  Eyes:     General:        Right eye: No discharge.        Left eye: No discharge.     Conjunctiva/sclera: Conjunctivae normal.     Pupils: Pupils are equal, round, and reactive to light.  Neck:     Thyroid: No thyromegaly.     Vascular: No JVD.     Trachea: No tracheal deviation.  Cardiovascular:     Rate and Rhythm: Normal rate and regular rhythm.     Heart sounds: Normal heart sounds.  Pulmonary:     Effort: No respiratory distress.     Breath sounds: No stridor. No wheezing.  Abdominal:     General: Bowel sounds are normal. There is no distension.     Palpations: Abdomen is soft. There is no mass.     Tenderness: There is no abdominal tenderness. There is no guarding or rebound.  Musculoskeletal:         General: No tenderness.     Cervical back: Normal range of motion and neck supple. No rigidity.  Lymphadenopathy:     Cervical: No cervical adenopathy.  Skin:    Findings: No erythema or rash.  Neurological:     Cranial Nerves: No cranial nerve deficit.     Motor: No abnormal muscle tone.     Coordination: Coordination normal.     Deep Tendon Reflexes: Reflexes normal.  Psychiatric:        Behavior: Behavior normal.        Thought Content: Thought content normal.        Judgment: Judgment normal.    Lab Results  Component Value Date   WBC 4.7 01/02/2021   HGB 13.2 01/02/2021   HCT 38.6 01/02/2021   PLT 226.0 01/02/2021   GLUCOSE 117 (H) 01/02/2021   CHOL 222 (H) 05/01/2020   TRIG 291 (H) 05/01/2020   HDL 38 (L) 05/01/2020   LDLDIRECT 119.0 05/07/2019   LDLCALC 139 (H) 05/01/2020   ALT 22 01/02/2021   AST 22 01/02/2021   NA 139 01/02/2021   K 3.5 01/02/2021   CL 99 01/02/2021   CREATININE 0.86 01/02/2021   BUN 19 01/02/2021   CO2 33 (H) 01/02/2021   TSH 2.08 01/02/2021   HGBA1C 6.0 01/02/2021    CT Head Wo Contrast  Result Date: 01/17/2021 CLINICAL DATA:  Headache, confusion EXAM: CT HEAD WITHOUT CONTRAST TECHNIQUE: Contiguous axial images were obtained from the base of the skull through the vertex without intravenous contrast. COMPARISON:  None. FINDINGS: Brain: No acute intracranial abnormality. Specifically, no hemorrhage, hydrocephalus, mass lesion, acute infarction, or significant intracranial injury. Vascular: No hyperdense vessel or unexpected calcification. Skull: No acute calvarial abnormality. Sinuses/Orbits: No acute findings Other: None IMPRESSION: No acute intracranial abnormality. Electronically Signed   By: Rolm Baptise M.D.   On: 01/17/2021 22:36    Assessment & Plan:     Walker Kehr, MD

## 2021-05-08 NOTE — Assessment & Plan Note (Signed)
  We discussed age appropriate health related issues, including available/recomended screening tests and vaccinations. Labs were ordered to be later reviewed . All questions were answered. We discussed one or more of the following - seat belt use, use of sunscreen/sun exposure exercise, fall risk reduction, second hand smoke exposure, firearm use and storage, seat belt use, a need for adhering to healthy diet and exercise. Labs were ordered.  All questions were answered. Colon 2011 - Dr Carlean Purl, due next 2021 mammo q 1 year PAP - advised to schedule Singrix Rx CT IMPRESSION: Coronary calcium score of 0. This was 0 percentile for age and sex matched control. Fransico Him Electronically Signed   By: Fransico Him   On: 06/13/2019 11:49

## 2021-05-22 ENCOUNTER — Other Ambulatory Visit: Payer: Self-pay

## 2021-05-22 ENCOUNTER — Ambulatory Visit (INDEPENDENT_AMBULATORY_CARE_PROVIDER_SITE_OTHER): Payer: Medicare Other

## 2021-05-22 DIAGNOSIS — K219 Gastro-esophageal reflux disease without esophagitis: Secondary | ICD-10-CM

## 2021-05-22 DIAGNOSIS — E785 Hyperlipidemia, unspecified: Secondary | ICD-10-CM

## 2021-05-22 DIAGNOSIS — I10 Essential (primary) hypertension: Secondary | ICD-10-CM | POA: Diagnosis not present

## 2021-05-22 NOTE — Patient Instructions (Signed)
Visit Information  PATIENT GOALS:  Goals Addressed             This Visit's Progress    Track and Manage My Blood Pressure-Hypertension   On track    Timeframe:  Long-Range Goal Priority:  Medium Start Date: 02/20/2021                            Expected End Date:  08/23/2021                     Follow Up Date 05/22/2021   - check blood pressure 3 times per week    Why is this important?   You won't feel high blood pressure, but it can still hurt your blood vessels.  High blood pressure can cause heart or kidney problems. It can also cause a stroke.  Making lifestyle changes like losing a little weight or eating less salt will help.  Checking your blood pressure at home and at different times of the day can help to control blood pressure.  If the doctor prescribes medicine remember to take it the way the doctor ordered.  Call the office if you cannot afford the medicine or if there are questions about it.     Notes:         Patient verbalizes understanding of instructions provided today and agrees to view in [place].   Telephone follow up appointment with care management team member scheduled for: months  The patient has been provided with contact information for the care management team and has been advised to call with any health related questions or concerns.   Tomasa Blase, PharmD Clinical Pharmacist, [place]

## 2021-05-22 NOTE — Progress Notes (Addendum)
Chronic Care Management Pharmacy Note  05/22/2021 Name:  Tina Bird MRN:  [id number] DOB:  Sep 06, 1951  Summary: - Reports that she has been doing well since last visit, stopped taking aspirin per her preference, blood pressure has been well controlled, no issues or complaints at this time -Did not restart atorvastatin since last visit, but was interested in [facility]   Recommendations/Changes made from today's visit: - Recommending for patient to restart atorvastatin 65m daily -patient to have CMP and lipid panel checked 6-8 weeks after restarting  -Patient to continue to monitor blood pressure 3 times weekly, will reach out should blood pressure be >140/90  Subjective: Tina Renais an 70y.o. year old female who is a primary patient of Plotnikov, AEvie Lacks MD.  The CCM team was consulted for assistance with disease management and care coordination needs.    Engaged with patient by telephone for follow up visit in response to provider referral for pharmacy case management and/or care coordination services.   Consent to Services:  The patient was given the following information about Chronic Care Management services today, agreed to services, and gave verbal consent: 1. CCM service includes personalized support from designated clinical staff supervised by the primary care provider, including individualized plan of care and coordination with other care providers 2. 24/7 contact phone numbers for assistance for urgent and routine care needs. 3. Service will only be billed when office clinical staff spend 20 minutes or more in a month to coordinate care. 4. Only one practitioner may furnish and bill the service in a calendar month. 5.The patient may stop CCM services at any time (effective at the end of the month) by phone call to the office staff. 6. The patient will be responsible for cost sharing (co-pay) of up to 20% of the service fee (after annual deductible is met).  Patient agreed to services and consent obtained.  Patient Care Team: Plotnikov, AEvie Lacks MD as PCP - General GCarlean PurlCOfilia Neas MD as Consulting Physician (Gastroenterology) Koren Sermersheim, DDarnelle Maffucci [facility](Pharmacist) SDelice BisonDDarnelle Maffucci RS[facility]as Pharmacist (Pharmacist) GWarden Fillers MD as Consulting Physician (Ophthalmology)  Recent office visits: 05/08/2021 - Dr. PAlain Marion- aspirin, allegra, tizanidine, and vitamin B12 discontinued, shingles vaccine given     Recent consult visits: 1/12,  1/20, and /27 - Chiropractic Medicine visits   Hospital visits: None in previous 6 months  Objective:  Lab Results  Component Value Date   CREATININE 0.86 01/02/2021   BUN 19 01/02/2021   GFR 68.02 01/02/2021   GFRNONAA >60 11/28/2018   GFRAA >60 11/28/2018   NA 139 01/02/2021   K 3.5 01/02/2021   CALCIUM 9.3 01/02/2021   CO2 33 (H) 01/02/2021   GLUCOSE 117 (H) 01/02/2021    Lab Results  Component Value Date/Time   HGBA1C 6.0 01/02/2021 11:45 AM   HGBA1C 6.0 12/07/2016 11:45 AM   GFR 68.02 01/02/2021 11:45 AM   GFR 64.44 05/07/2019 09:43 AM    Last diabetic Eye exam:  Lab Results  Component Value Date/Time   HMDIABEYEEXA No Retinopathy 01/19/2021 12:00 AM    Last diabetic Foot exam:  No results found for: HMDIABFOOTEX   Lab Results  Component Value Date   CHOL 222 (H) 05/01/2020   HDL 38 (L) 05/01/2020   LDLCALC 139 (H) 05/01/2020   LDLDIRECT 119.0 05/07/2019   TRIG 291 (H) 05/01/2020   CHOLHDL 5.8 (H) 05/01/2020    Hepatic Function Latest Ref Rng & Units 01/02/2021 05/07/2019 08/15/2017  Total Protein 6.0 - 8.3 g/dL 7.0 6.9 7.2  Albumin 3.5 - 5.2 g/dL 4.2 4.3 4.2  AST 0 - 37 U/L _0 ALT 0 - 35 U/L _1 Alk Phosphatase 39 - 117 U/L 70 73 73  Total Bilirubin 0.2 - 1.2 mg/dL 0.3 0.3 0.4  Bilirubin, Direct 0.0 - 0.3 mg/dL - 0.1 0.1    Lab Results  Component Value Date/Time   TSH 2.08 01/02/2021 11:45 AM   TSH 1.12 05/07/2019 09:43 AM    CBC Latest Ref Rng &  Units 01/02/2021 05/07/2019 08/15/2017  WBC 4.0 - 10.5 K/uL 4.7 4.6 5.0  Hemoglobin 12.0 - 15.0 g/dL 13.2 12.9 13.2  Hematocrit 36.0 - 46.0 % 38.6 38.8 39.9  Platelets 150.0 - 400.0 K/uL 226.0 215.0 234.0    Lab Results  Component Value Date/Time   VD25OH 41 06/10/2010 06:09 PM    Clinical ASCVD: No  The 10-year ASCVD risk score Mikey Bussing DC Jr., et al., 2013) is: 13.4%   Values used to calculate the score:     Age: 70 years     Sex: Female     Is Non-Hispanic African American: No     Diabetic: No     Tobacco smoker: No     Systolic Blood Pressure: 536 mmHg     Is BP treated: Yes     HDL Cholesterol: 38 mg/dL     Total Cholesterol: 222 mg/dL    Depression screen [facility] 2/9 05/08/2021 02/20/2021 02/19/2021  Decreased Interest 0 0 0  Down, Depressed, Hopeless 0 1 0  PHQ - 2 Score 0 1 0  Altered sleeping 0 - -  Tired, decreased energy 0 - -  Change in appetite 0 - -  Feeling bad or failure about yourself  0 - -  Trouble concentrating 0 - -  Moving slowly or fidgety/restless 0 - -  Suicidal thoughts 0 - -  PHQ-9 Score 0 - -     Social History   Tobacco Use  Smoking Status Former   Packs/day: 0.50   Years: 30.00   Pack years: 15.00   Types: Cigarettes   Quit date: 10/04/2001   Years since quitting: 19.6  Smokeless Tobacco Never   BP Readings from Last 3 Encounters:  05/08/21 120/82  02/19/21 120/66  01/02/21 138/72   Pulse Readings from Last 3 Encounters:  05/08/21 73  02/19/21 75  01/02/21 85   Wt Readings from Last 3 Encounters:  05/08/21 156 lb 6.4 oz (70.9 kg)  02/19/21 157 lb 6.4 oz (71.4 kg)  01/02/21 156 lb 9.6 oz (71 kg)   BMI Readings from Last 3 Encounters:  05/08/21 26.03 kg/m  02/19/21 26.19 kg/m  01/02/21 26.06 kg/m    Assessment/Interventions: Review of patient past medical history, allergies, medications, health status, including review of consultants reports, laboratory and other test data, was performed as part of comprehensive evaluation and  provision of chronic care management services.   SDOH:  (Social Determinants of Health) assessments and interventions performed: Yes  SDOH Screenings   Alcohol Screen: Low Risk    Last Alcohol Screening Score (AUDIT): 0  Depression (PHQ2-9): Low Risk    PHQ-2 Score: 0  Financial Resource Strain: Low Risk    Difficulty of Paying Living Expenses: Not hard at all  Food Insecurity: No Food Insecurity   Worried About Charity fundraiser in the Last Year: Never true   Ran Out of Food in the Last Year: Never  true  Housing: Low Risk    Last Housing Risk Score: 0  Physical Activity: Sufficiently Active   Days of Exercise per Week: 5 days   Minutes of Exercise per Session: 30 min  Social Connections: Socially Integrated   Frequency of Communication with Friends and Family: More than three times a week   Frequency of Social Gatherings with Friends and Family: Once a week   Attends Religious Services: 1 to 4 times per year   Active Member of [employer] or Organizations: No   Attends Engineer, structural: 1 to 4 times per year   Marital Status: Married  Stress: No Stress Concern Present   Feeling of Stress : Not at all  Tobacco Use: Medium Risk   Smoking Tobacco Use: Former   Smokeless Tobacco Use: Never  Transportation Needs: No Regulatory affairs officer (Medical): No   Lack of Transportation (Non-Medical): No    CCM Care Plan  Allergies  Allergen Reactions   Poison Ivy Extract     Medications Reviewed Today     Reviewed by Tresa Garter, MD (Physician) on 05/08/21 at 1133  Med List Status: <None>   Medication Order Taking? Sig Documenting Provider Last Dose Status Informant  acyclovir (ZOVIRAX) 400 MG tablet [id number] Yes TAKE 1 TABLET(400 MG) BY MOUTH THREE TIMES DAILY FOR 10 DAYS Plotnikov, Georgina Quint, MD Taking Active   B Complex Vitamins (B COMPLEX-B12 PO) [id number] Yes Take 1 tablet by mouth daily. [provider] Taking Active    Cholecalciferol 1000 UNITS tablet [id number] Yes Take 1,000 Units by mouth daily. [provider] Taking Active   escitalopram (LEXAPRO) 5 MG tablet [id number] Yes Take 1 tablet (5 mg total) by mouth daily. Must keep august appt for future refills Plotnikov, Georgina Quint, MD Taking Active   fluticasone (FLONASE) 50 MCG/ACT nasal spray [id number] Yes Place 2 sprays into both nostrils daily. Olive Bass, FNP Taking Active   loratadine (CLARITIN) 10 MG tablet [id number] Yes Take 10 mg by mouth daily. [provider] Taking Active   magnesium oxide (MAG-OX) 400 MG tablet [id number] Yes Take 400 mg by mouth daily. [provider] Taking Active   omeprazole (PRILOSEC) 40 MG capsule [id number] Yes TAKE 1 CAPSULE BY MOUTH EVERY DAY Annual appt is due must see provider for future refills Plotnikov, Georgina Quint, MD Taking Active   triamterene-hydrochlorothiazide (MAXZIDE-25) 37.5-25 MG tablet [id number] Yes TAKE 1/2 TABLET BY MOUTH DAILY. ANNUAL APPT DUE IN JULY MUST SEE PROVIDER FOR FUTURE REFILLS Plotnikov, Georgina Quint, MD Taking Active             Patient Active Problem List   Diagnosis Date Noted   Right-sided headache 07/16/2020   Trochanteric bursitis of left hip 03/24/2020   GERD (gastroesophageal reflux disease) 03/24/2020   Anxiety disorder 03/24/2020   Urinary tract infection 01/31/2020   Osteopenia 01/25/2019   Dermatochalasis of both upper eyelids 07/06/2018   Upper respiratory infection 04/05/2018   Conjunctivitis 04/05/2018   Sciatic leg pain 01/25/2018   Pharyngitis 08/30/2017   Contact dermatitis 01/26/2017   Neuropathy 12/07/2016   Essential hypertension, benign 05/25/2016   Dyslipidemia 05/25/2016   Acute appendicitis 01/25/2016   Rash and nonspecific skin eruption 04/22/2015   Near syncope 11/05/2013   Heart murmur 11/05/2013   Well adult exam 10/30/2012   Internal hemorrhoids with prolapse & pain 07/12/2012   IBS (irritable bowel syndrome)  07/12/2012   Diverticulosis of colon with sigmoid stricture 07/12/2012  Cough 03/07/2012   Asthmatic bronchitis 03/07/2012   Hip pain, chronic 12/14/2011   Glossal erythema 12/14/2011   Osteoarthritis 07/27/2011   HIP PAIN 12/02/2010   MENOPAUSAL SYNDROME 06/02/2010   DIZZINESS 06/02/2010   LOW BACK PAIN 05/13/2007    Immunization History  Administered Date(s) Administered   Influenza Split 07/27/2011, 08/29/2012   Influenza Whole 06/02/2010   Influenza, High Dose Seasonal PF 07/15/2016, 07/11/2017   Influenza,inj,Quad PF,6+ Mos 09/12/2013, 07/26/2014, 07/02/2015, 06/25/2018, 06/19/2019   Influenza-Unspecified 06/21/2020   Moderna SARS-COV2 Booster Vaccination 02/15/2021   Moderna Sars-Covid-2 Vaccination 11/19/2019, 12/18/2019, 07/27/2020   Pneumococcal Conjugate-13 08/09/2018   Pneumococcal Polysaccharide-23 05/07/2016   Td 06/04/2009   Tdap 05/01/2020   Zoster, Live 08/11/2011    Conditions to be addressed/monitored:  Hypertension, Hyperlipidemia, GERD, Depression, Allergic Rhinitis and Chronic left hip pain   Care Plan : CCM Care plan  Updates made by Tomasa Blase, RPH since 05/22/2021 12:00 AM     Problem: HTN, HLD, Depression, GERD, Allergic Rhinitis   Priority: High  Onset Date: 02/20/2021     Long-Range Goal: Disease Management   Start Date: 02/20/2021  Expected End Date: 08/23/2021  This Visit's Progress: On track  Recent Progress: On track  Priority: High  Note:   Current Barriers:  Unable to independently monitor therapeutic efficacy  Pharmacist Clinical Goal(s):  Patient will achieve adherence to monitoring guidelines and medication adherence to achieve therapeutic efficacy achieve control of LDL  as evidenced by next lipid panel maintain control of BP as evidenced by continued BP logs in goal range   through collaboration with PharmD and provider.   Interventions: 1:1 collaboration with Plotnikov, Evie Lacks, MD regarding development and update  of comprehensive plan of care as evidenced by provider attestation and co-signature Inter-disciplinary care team collaboration (see longitudinal plan of care) Comprehensive medication review performed; medication list updated in electronic medical record  Hypertension (BP goal <140/90) -Controlled -Current treatment: Triamterene-HCTZ - 37.5-25mg  - 1/2 tablet daily  -Medications previously tried: n/a  -Current home readings: has not been checking at home - yesterday was 120/82 in office  -Current dietary habits: notes that she watches sodium and caffeine intake  -Current exercise habits: walks at the [facility] / gardening Denies hypotensive/hypertensive symptoms -Educated on BP goals and benefits of medications for prevention of heart attack, stroke and kidney damage; Daily salt intake goal < 2300 mg; Exercise goal of 150 minutes per week; Symptoms of hypotension and importance of maintaining adequate hydration; -Counseled to monitor BP at home three times weekly, document, and provide log at future appointments -Counseled on diet and exercise extensively Recommended to continue current medication  Hyperlipidemia: (LDL goal < 100) -Not ideally controlled  - Last LDL 139 mg/dL (05/01/2020) -Current treatment: ASA 81mg  daily - not currently taking (patinet unsure as to why she stopped taking) -Medications previously tried: atorvastatin 10mg    -Current dietary patterns: avoiding excessive fried / fatty foods / red meat consumption -Current exercise habits: walking at the [facility] often, gardening as well -Educated on Cholesterol goals;  Benefits of statin for ASCVD risk reduction; Importance of limiting foods high in cholesterol; -Counseled on diet and exercise extensively Recommended for patient to re-trial atorvastatin 10mg  daily - denies any issues with medication in the past   Depression/Anxiety (Goal: Mood control / prevention of disease progression) -Controlled -Current  treatment: Escitalopram 5mg  daily  -Medications previously tried/failed: citalopram, venlafaxine  -PHQ9: 0 -Educated on Benefits of medication for symptom control -Recommended to continue current medication  Allergic Rhinitis (Goal: Allergy control / flare prevention) -Controlled -Current treatment  Fluticasone 46mg / act 1-2 sprays into each nostril daily  Loratadine 156mdaily  -Medications previously tried: loratadine, cetirizine   -Recommended to continue current medication   GERD (Goal: Acid control / flare prevention) -Controlled -Current treatment  Omeprazole 4075maily  -Medications previously tried: Famotidine  -Counseled on diet and exercise extensively Recommended to continue current medication  Health Maintenance -Current therapy:  Acyclovir 400m56mTID x 10 days - prn cold sores  Cholecalciferol 1000 units daily  Vit B12 1000mc66mily  Magnesium Oxide 400mg 10my   -Educated on Herbal supplement research is limited and benefits usually cannot be proven Cost vs benefit of each product must be carefully weighed by individual consumer -Patient is satisfied with current therapy and denies issues -Recommended to continue current medication  Patient Goals/Self-Care Activities Patient will:  - take medications as prescribed check blood pressure 3 times weekly , document, and provide at future appointments     Medication Assistance: None required.  Patient affirms current coverage meets needs.  [id number]: [phone] pill box? Yes Pt endorses 100% compliance   Care Plan and Follow Up Patient Decision:  Patient agrees to Care Plan and Follow-up.  Plan: Telephone follow up appointment with care management team member scheduled for:  2 months  and The patient has been provided with  contact information for the care management team and has been advised to call with any health related questions or concerns.    DanielTomasa BlasemD Clinical Pharmacist, [place] examination/treatment/procedure(s) were performed by non-physician practitioner and as supervising physician I was immediately available for consultation/collaboration.  I agree with above. AlekseLew Dawes

## 2021-07-31 ENCOUNTER — Other Ambulatory Visit: Payer: Self-pay

## 2021-07-31 ENCOUNTER — Ambulatory Visit (INDEPENDENT_AMBULATORY_CARE_PROVIDER_SITE_OTHER): Payer: Medicare Other

## 2021-07-31 DIAGNOSIS — E785 Hyperlipidemia, unspecified: Secondary | ICD-10-CM

## 2021-07-31 DIAGNOSIS — I10 Essential (primary) hypertension: Secondary | ICD-10-CM

## 2021-07-31 NOTE — Patient Instructions (Signed)
Tina Bird,  It was great to talk to you today!  Please call me with any questions or concerns.  Visit Information  PATIENT GOALS:  Goals Addressed             This Visit's Progress    Track and Manage My Blood Pressure-Hypertension   Not on track    Timeframe:  Long-Range Goal Priority:  Medium Start Date: 02/20/2021                            Expected End Date:  08/23/2021                     Follow Up Date 05/22/2021   - check blood pressure 3 times per week    Why is this important?   You won't feel high blood pressure, but it can still hurt your blood vessels.  High blood pressure can cause heart or kidney problems. It can also cause a stroke.  Making lifestyle changes like losing a little weight or eating less salt will help.  Checking your blood pressure at home and at different times of the day can help to control blood pressure.  If the doctor prescribes medicine remember to take it the way the doctor ordered.  Call the office if you cannot afford the medicine or if there are questions about it.     Notes:         Patient verbalizes understanding of instructions provided today and agrees to view in [place].   Telephone follow up appointment with care management team member scheduled for: 4 months The patient has been provided with contact information for the care management team and has been advised to call with any health related questions or concerns.   Tomasa Blase, PharmD Clinical Pharmacist, [place]

## 2021-07-31 NOTE — Progress Notes (Addendum)
Chronic Care Management Pharmacy Note  07/31/2021 Name:  Tina Bird MRN:  [id number] DOB:  1951-07-03  Summary: - Patient doing well since last visit, reports that blood pressure has ben well controlled with most recent office checks, notes she has not been checking at home  -Has not yet restarted atorvastatin - is agreeable to restarting if PCP is agreeable   Recommendations/Changes made from today's visit: - Recommending for patient to restart atorvastatin 51m daily -patient to have CMP and lipid panel checked 6-8 weeks after restarting  -Patient to continue to monitor blood pressure 1-2 times weekly, will reach out should blood pressure be >140/90  Subjective: BAurea Bird an 70y.o. year old female who is a primary patient of Tina Bird.  The CCM team was consulted for assistance with disease management and care coordination needs.    Engaged with patient by telephone for follow up visit in response to provider referral for pharmacy case management and/or care coordination services.   Consent to Services:  The patient was given the following information about Chronic Care Management services today, agreed to services, and gave verbal consent: 1. CCM service includes personalized support from designated clinical staff supervised by the primary care provider, including individualized plan of care and coordination with other care providers 2. 24/7 contact phone numbers for assistance for urgent and routine care needs. 3. Service will only be billed when office clinical staff spend 20 minutes or more in a month to coordinate care. 4. Only one practitioner may furnish and bill the service in a calendar month. 5.The patient may stop CCM services at any time (effective at the end of the month) by phone call to the office staff. 6. The patient will be responsible for cost sharing (co-pay) of up to 20% of the service fee (after annual deductible is met).  Patient agreed to services and consent obtained.  Patient Care Team: Tina Bird as PCP - General GCarlean PurlCOfilia Neas Bird as Consulting Physician (Gastroenterology) Autumnrose Yore, DDarnelle Maffucci [facility](Pharmacist) SDelice Bison DDarnelle Maffucci R[facility]as Pharmacist (Pharmacist) GWarden Fillers Bird as Consulting Physician (Ophthalmology)  Recent office visits: None since last visit   Recent consult visits: None since last visit   Hospital visits: None in previous 6 months  Objective:  Lab Results  Component Value Date   CREATININE 0.86 01/02/2021   BUN 19 01/02/2021   GFR 68.02 01/02/2021   GFRNONAA >60 11/28/2018   GFRAA >60 11/28/2018   NA 139 01/02/2021   K 3.5 01/02/2021   CALCIUM 9.3 01/02/2021   CO2 33 (H) 01/02/2021   GLUCOSE 117 (H) 01/02/2021    Lab Results  Component Value Date/Time   HGBA1C 6.0 01/02/2021 11:45 AM   HGBA1C 6.0 12/07/2016 11:45 AM   GFR 68.02 01/02/2021 11:45 AM   GFR 64.44 05/07/2019 09:43 AM    Last diabetic Eye exam:  Lab Results  Component Value Date/Time   HMDIABEYEEXA No Retinopathy 01/19/2021 12:00 AM    Last diabetic Foot exam:  No results found for: HMDIABFOOTEX   Lab Results  Component Value Date   CHOL 222 (H) 05/01/2020   HDL 38 (L) 05/01/2020   LDLCALC 139 (H) 05/01/2020   LDLDIRECT 119.0 05/07/2019   TRIG 291 (H) 05/01/2020   CHOLHDL 5.8 (H) 05/01/2020    Hepatic Function Latest Ref Rng & Units 01/02/2021 05/07/2019 08/15/2017  Total Protein 6.0 - 8.3 g/dL 7.0 6.9 7.2  Albumin 3.5 - 5.2 g/dL 4.2 4.3  4.2  AST 0 - 37 U/L _0 ALT 0 - 35 U/L _1 Alk Phosphatase 39 - 117 U/L 70 73 73  Total Bilirubin 0.2 - 1.2 mg/dL 0.3 0.3 0.4  Bilirubin, Direct 0.0 - 0.3 mg/dL - 0.1 0.1    Lab Results  Component Value Date/Time   TSH 2.08 01/02/2021 11:45 AM   TSH 1.12 05/07/2019 09:43 AM    CBC Latest Ref Rng & Units 01/02/2021 05/07/2019 08/15/2017  WBC 4.0 - 10.5 K/uL 4.7 4.6 5.0  Hemoglobin 12.0 - 15.0 g/dL 13.2 12.9 13.2   Hematocrit 36.0 - 46.0 % 38.6 38.8 39.9  Platelets 150.0 - 400.0 K/uL 226.0 215.0 234.0    Lab Results  Component Value Date/Time   VD25OH 41 06/10/2010 06:09 PM    Clinical ASCVD: No  The 10-year ASCVD risk score (Arnett DK, et al., 2019) is: 13.4%   Values used to calculate the score:     Age: 70 years     Sex: Female     Is Non-Hispanic African American: No     Diabetic: No     Tobacco smoker: No     Systolic Blood Pressure: 703 mmHg     Is BP treated: Yes     HDL Cholesterol: 38 mg/dL     Total Cholesterol: 222 mg/dL    Depression screen [facility] 2/9 05/08/2021 02/20/2021 02/19/2021  Decreased Interest 0 0 0  Down, Depressed, Hopeless 0 1 0  PHQ - 2 Score 0 1 0  Altered sleeping 0 - -  Tired, decreased energy 0 - -  Change in appetite 0 - -  Feeling bad or failure about yourself  0 - -  Trouble concentrating 0 - -  Moving slowly or fidgety/restless 0 - -  Suicidal thoughts 0 - -  PHQ-9 Score 0 - -     Social History   Tobacco Use  Smoking Status Former   Packs/day: 0.50   Years: 30.00   Pack years: 15.00   Types: Cigarettes   Quit date: 10/04/2001   Years since quitting: 19.8  Smokeless Tobacco Never   BP Readings from Last 3 Encounters:  05/08/21 120/82  02/19/21 120/66  01/02/21 138/72   Pulse Readings from Last 3 Encounters:  05/08/21 73  02/19/21 75  01/02/21 85   Wt Readings from Last 3 Encounters:  05/08/21 156 lb 6.4 oz (70.9 kg)  02/19/21 157 lb 6.4 oz (71.4 kg)  01/02/21 156 lb 9.6 oz (71 kg)   BMI Readings from Last 3 Encounters:  05/08/21 26.03 kg/m  02/19/21 26.19 kg/m  01/02/21 26.06 kg/m    Assessment/Interventions: Review of patient past medical history, allergies, medications, health status, including review of consultants reports, laboratory and other test data, was performed as part of comprehensive evaluation and provision of chronic care management services.   SDOH:  (Social Determinants of Health) assessments and interventions  performed: Yes  SDOH Screenings   Alcohol Screen: Low Risk    Last Alcohol Screening Score (AUDIT): 0  Depression (PHQ2-9): Low Risk    PHQ-2 Score: 0  Financial Resource Strain: Low Risk    Difficulty of Paying Living Expenses: Not hard at all  Food Insecurity: No Food Insecurity   Worried About Charity fundraiser in the Last Year: Never true   Ran Out of Food in the Last Year: Never true  Housing: Low Risk    Last Housing Risk Score: 0  Physical Activity: Sufficiently Active  Days of Exercise per Week: 5 days   Minutes of Exercise per Session: 30 min  Social Connections: Socially Integrated   Frequency of Communication with Friends and Family: More than three times a week   Frequency of Social Gatherings with Friends and Family: Once a week   Attends Religious Services: 1 to 4 times per year   Active Member of [employer] or Organizations: No   Attends Music therapist: 1 to 4 times per year   Marital Status: Married  Stress: No Stress Concern Present   Feeling of Stress : Not at all  Tobacco Use: Medium Risk   Smoking Tobacco Use: Former   Smokeless Tobacco Use: Never   Passive Exposure: Not on Pensions consultant Needs: No Transportation Needs   Lack of Transportation (Medical): No   Lack of Transportation (Non-Medical): No    CCM Care Plan  Allergies  Allergen Reactions   Poison Ivy Extract     Medications Reviewed Today     Reviewed by Cassandria Anger, Bird (Physician) on 05/08/21 at 1133  Med List Status: <None>   Medication Order Taking? Sig Documenting Provider Last Dose Status Informant  acyclovir (ZOVIRAX) 400 MG tablet [id number] Yes TAKE 1 TABLET(400 MG) BY MOUTH THREE TIMES DAILY FOR 10 DAYS Plotnikov, Evie Lacks, Bird Taking Active   B Complex Vitamins (B COMPLEX-B12 PO) [id number] Yes Take 1 tablet by mouth daily. Provider, Historical, Bird Taking Active   Cholecalciferol 1000 UNITS tablet [id number] Yes Take 1,000 Units by mouth daily. Provider,  Historical, Bird Taking Active   escitalopram (LEXAPRO) 5 MG tablet [id number] Yes Take 1 tablet (5 mg total) by mouth daily. Must keep august appt for future refills Plotnikov, Evie Lacks, Bird Taking Active   fluticasone (FLONASE) 50 MCG/ACT nasal spray [id number] Yes Place 2 sprays into both nostrils daily. Marrian Salvage, FNP Taking Active   loratadine (CLARITIN) 10 MG tablet [id number] Yes Take 10 mg by mouth daily. Provider, Historical, Bird Taking Active   magnesium oxide (MAG-OX) 400 MG tablet [id number] Yes Take 400 mg by mouth daily. Provider, Historical, Bird Taking Active   omeprazole (PRILOSEC) 40 MG capsule [id number] Yes TAKE 1 CAPSULE BY MOUTH EVERY DAY Annual appt is due must see provider for future refills Plotnikov, Evie Lacks, Bird Taking Active   triamterene-hydrochlorothiazide (MAXZIDE-25) 37.5-25 MG tablet [id number] Yes TAKE 1/2 TABLET BY MOUTH DAILY. ANNUAL APPT DUE IN JULY MUST SEE PROVIDER FOR FUTURE REFILLS Plotnikov, Evie Lacks, Bird Taking Active             Patient Active Problem List   Diagnosis Date Noted   Right-sided headache 07/16/2020   Trochanteric bursitis of left hip 03/24/2020   GERD (gastroesophageal reflux disease) 03/24/2020   Anxiety disorder 03/24/2020   Urinary tract infection 01/31/2020   Osteopenia 01/25/2019   Dermatochalasis of both upper eyelids 07/06/2018   Upper respiratory infection 04/05/2018   Conjunctivitis 04/05/2018   Sciatic leg pain 01/25/2018   Pharyngitis 08/30/2017   Contact dermatitis 01/26/2017   Neuropathy 12/07/2016   Essential hypertension, benign 05/25/2016   Dyslipidemia 05/25/2016   Acute appendicitis 01/25/2016   Rash and nonspecific skin eruption 04/22/2015   Near syncope 11/05/2013   Heart murmur 11/05/2013   Well adult exam 10/30/2012   Internal hemorrhoids with prolapse & pain 07/12/2012   IBS (irritable bowel syndrome) 07/12/2012   Diverticulosis of colon with sigmoid stricture 07/12/2012   Cough 03/07/2012    Asthmatic bronchitis 03/07/2012   Hip pain, chronic 12/14/2011  Glossal erythema 12/14/2011   Osteoarthritis 07/27/2011   HIP PAIN 12/02/2010   MENOPAUSAL SYNDROME 06/02/2010   DIZZINESS 06/02/2010   LOW BACK PAIN 05/13/2007    Immunization History  Administered Date(s) Administered   Influenza Split 07/27/2011, 08/29/2012   Influenza Whole 06/02/2010   Influenza, High Dose Seasonal PF 07/15/2016, 07/11/2017   Influenza,inj,Quad PF,6+ Mos 09/12/2013, 07/26/2014, 07/02/2015, 06/25/2018, 06/19/2019, 06/16/2021   Influenza-Unspecified 06/21/2020   Moderna SARS-COV2 Booster Vaccination 02/15/2021   Moderna Sars-Covid-2 Vaccination 11/19/2019, 12/18/2019, 07/27/2020   Pneumococcal Conjugate-13 08/09/2018   Pneumococcal Polysaccharide-23 05/07/2016   Td 06/04/2009   Tdap 05/01/2020   Zoster, Live 08/11/2011    Conditions to be addressed/monitored:  Hypertension, Hyperlipidemia, GERD, Depression, Allergic Rhinitis and Chronic left hip pain   Care Plan : CCM Care plan  Updates made by Tomasa Blase, [place] since 07/31/2021 12:00 AM     Problem: HTN, HLD, Depression, GERD, Allergic Rhinitis   Priority: High  Onset Date: 02/20/2021     Long-Range Goal: Disease Management   Start Date: 02/20/2021  Expected End Date: 08/23/2021  This Visit's Progress: On track  Recent Progress: On track  Priority: High  Note:   Current Barriers:  Unable to independently monitor therapeutic efficacy  Pharmacist Clinical Goal(s):  Patient will achieve adherence to monitoring guidelines and medication adherence to achieve therapeutic efficacy achieve control of LDL  as evidenced by next lipid panel maintain control of BP as evidenced by continued BP logs in goal range   through collaboration with PharmD and provider.   Interventions: 1:1 collaboration with Plotnikov, Evie Lacks, Bird regarding development and update of comprehensive plan of care as evidenced by provider attestation and  co-signature Inter-disciplinary care team collaboration (see longitudinal plan of care) Comprehensive medication review performed; medication list updated in electronic medical record  Hypertension (BP goal <140/90) -Controlled -Current treatment: Triamterene-HCTZ - 37.5-25mg - 1/2 tablet daily  -Medications previously tried: n/a  -Current home readings: reports that blood pressure has been averaging in the 120/80's at times can elevate to 140/80 but is typically due to increases in pain levels  -Current dietary habits: notes that she watches sodium and caffeine intake  -Current exercise habits: walks at the [facility] / gardening Denies hypotensive/hypertensive symptoms -Educated on BP goals and benefits of medications for prevention of heart attack, stroke and kidney damage; Daily salt intake goal < 2300 mg; Exercise goal of 150 minutes per week; Symptoms of hypotension and importance of maintaining adequate hydration; -Counseled to monitor BP at home three times weekly, document, and provide log at future appointments -Counseled on diet and exercise extensively Recommended to continue current medication  Hyperlipidemia: (LDL goal < 100) -Not ideally controlled  Lab Results  Component Value Date   LDLCALC 139 (H) 05/01/2020  -Current treatment: ASA 52m daily -Medications previously tried: atorvastatin 152m  -Current dietary patterns: avoiding excessive fried / fatty foods / red meat consumption -Current exercise habits: [facility], gardening as well -Educated on Cholesterol goals;  Benefits of statin for ASCVD risk reduction; Importance of limiting foods high in cholesterol; -Counseled on diet and exercise extensively Recommended for patient to re-trial atorvastatin 1052maily - denies any issues with medication in the past   Depression/Anxiety (Goal: Mood control / prevention of disease progression) -Controlled -Current treatment: Escitalopram 5mg75mily   -Medications previously tried/failed: citalopram, venlafaxine  -PHQ9: 0 -Educated on Benefits of medication for symptom control -Recommended to continue current medication   Allergic Rhinitis (Goal: Allergy control /  flare prevention) -Controlled -Current treatment  Fluticasone 78mg / act 1-2 sprays into each nostril daily  Loratadine 117mdaily  -Medications previously tried: loratadine, cetirizine   -Recommended to continue current medication   GERD (Goal: Acid control / flare prevention) -Controlled -Current treatment  Omeprazole 4040maily  -Medications previously tried: Famotidine  -Counseled on diet and exercise extensively Recommended to continue current medication  Health Maintenance -Current therapy:  Acyclovir 400m61mTID x 10 days - prn cold sores  Cholecalciferol 1000 units daily  Vit B12 1000mc33mily  Magnesium Oxide 400mg 92my   -Educated on Herbal supplement research is limited and benefits usually cannot be proven Cost vs benefit of each product must be carefully weighed by individual consumer -Patient is satisfied with current therapy and denies issues -Recommended to continue current medication  Patient Goals/Self-Care Activities Patient will:  - take medications as prescribed check blood pressure 1-2 times weekly , document, and provide at future appointments      Medication Assistance: None required.  Patient affirms current coverage meets needs.  [id number] [id number]: [phone] pill box? Yes Pt endorses 100% compliance   Care Plan and Follow Up Patient Decision:  Patient agrees to Care Plan and Follow-up.  Plan: Telephone follow up appointment with care management team member scheduled for:  4 months  and The patient has been provided with contact information for the care  management team and has been advised to call with any health related questions or concerns.    DanielTomasa BlasemD Clinical Pharmacist, LebaueRadnorning examination/treatment/procedure(s) were performed by non-physician practitioner and as supervising physician I was immediately available for consultation/collaboration.  I agree with above. AlekseLew Dawes

## 2021-08-03 ENCOUNTER — Telehealth: Payer: Self-pay | Admitting: Internal Medicine

## 2021-08-03 DIAGNOSIS — E785 Hyperlipidemia, unspecified: Secondary | ICD-10-CM

## 2021-08-03 DIAGNOSIS — I10 Essential (primary) hypertension: Secondary | ICD-10-CM | POA: Diagnosis not present

## 2021-08-03 NOTE — Telephone Encounter (Signed)
Patient calling in  Patient has telephone visit w/ pharmacist Dan 10/28  Dan recommended patient start taking atorvastatin 10mg  daily & sent message to Plotnikov on recommendation  Patient calling in to see if Plotnikov was okay w/ the recommendation & when rx would be sent to pharmacy  Please call patient [phone]

## 2021-08-04 NOTE — Telephone Encounter (Signed)
Taking fish oil should be sufficient. Carotids and coronary arteries are clear. Thanks

## 2021-08-05 ENCOUNTER — Other Ambulatory Visit: Payer: Self-pay

## 2021-08-05 ENCOUNTER — Ambulatory Visit
Admission: RE | Admit: 2021-08-05 | Discharge: 2021-08-05 | Disposition: A | Discharge status: Home / Self Care | Payer: Medicare Other | Source: Ambulatory Visit | Attending: Internal Medicine | Admitting: Internal Medicine

## 2021-08-05 DIAGNOSIS — Z1231 Encounter for screening mammogram for malignant neoplasm of breast: Secondary | ICD-10-CM | POA: Diagnosis not present

## 2021-08-05 NOTE — Telephone Encounter (Signed)
Called pt there was no answer LMOM w/MD response../lmb 

## 2021-08-07 ENCOUNTER — Telehealth: Payer: Self-pay

## 2021-08-07 NOTE — Progress Notes (Signed)
    Chronic Care Management Pharmacy Assistant   Name: Virtie Bungert  MRN: [id number] DOB: April 01, 1951   Reason for Encounter: Disease State   Conditions to be addressed/monitored: General   Recent office visits:  None ID  Recent consult visits:  None ID  Hospital visits:  None in previous 6 months  Medications: Outpatient Encounter Medications as of 08/07/2021  Medication Sig   acyclovir (ZOVIRAX) 400 MG tablet TAKE 1 TABLET(400 MG) BY MOUTH THREE TIMES DAILY FOR 10 DAYS   B Complex Vitamins (B COMPLEX-B12 PO) Take 1 tablet by mouth daily.   Cholecalciferol 1000 UNITS tablet Take 1,000 Units by mouth daily.   escitalopram (LEXAPRO) 5 MG tablet Take 1 tablet (5 mg total) by mouth daily.   fluticasone (FLONASE) 50 MCG/ACT nasal spray Place 2 sprays into both nostrils daily.   loratadine (CLARITIN) 10 MG tablet Take 10 mg by mouth daily.   magnesium oxide (MAG-OX) 400 MG tablet Take 400 mg by mouth daily.   omeprazole (PRILOSEC) 40 MG capsule TAKE 1 CAPSULE BY MOUTH EVERY DAY   triamterene-hydrochlorothiazide (MAXZIDE-25) 37.5-25 MG tablet TAKE 1/2 TABLET BY MOUTH DAILY   No facility-administered encounter medications on file as of 08/07/2021.   Pharmacist Review Have you had any problems recently with your health? Patient states that she is not having any new health issues  Have you had any problems with your pharmacy?Patient states she does not have any problems with getting her medications or the cost of medications at the pharmacy  What issues or side effects are you having with your medications?Patient states she does not have any side effects to medications  What would you like me to pass along to [facility] for them to help you with? Patient stated to pass along to [facility] that Dr. Alain Marion does not believe she needs to be on a statin, so she is starting fish oil today  What can we do to take care of you better? Patient states nothing at this  time  Care Gaps: Colonoscopy-07/28/10 Diabetic Foot Exam-NA Mammogram-08/05/21 Ophthalmology-NA Dexa Scan - NA Annual Well Visit - NA Micro albumin-NA Hemoglobin A1c- 01/02/21   Star Rating Drugs: None ID  Ethelene Hal Clinical Pharmacist Assistant [phone]

## 2021-08-20 ENCOUNTER — Encounter: Payer: Self-pay | Admitting: Internal Medicine

## 2021-08-20 ENCOUNTER — Other Ambulatory Visit: Payer: Self-pay

## 2021-08-20 ENCOUNTER — Ambulatory Visit (INDEPENDENT_AMBULATORY_CARE_PROVIDER_SITE_OTHER): Payer: Medicare Other | Admitting: Internal Medicine

## 2021-08-20 DIAGNOSIS — M5410 Radiculopathy, site unspecified: Secondary | ICD-10-CM | POA: Diagnosis not present

## 2021-08-20 DIAGNOSIS — Z0000 Encounter for general adult medical examination without abnormal findings: Secondary | ICD-10-CM | POA: Diagnosis not present

## 2021-08-20 DIAGNOSIS — R739 Hyperglycemia, unspecified: Secondary | ICD-10-CM | POA: Diagnosis not present

## 2021-08-20 LAB — COMPREHENSIVE METABOLIC PANEL
ALT: 24 U/L (ref 0–35)
AST: 25 U/L (ref 0–37)
Albumin: 4.5 g/dL (ref 3.5–5.2)
Alkaline Phosphatase: 70 U/L (ref 39–117)
BUN: 21 mg/dL (ref 6–23)
CO2: 33 mEq/L — ABNORMAL HIGH (ref 19–32)
Calcium: 9.8 mg/dL (ref 8.4–10.5)
Chloride: 98 mEq/L (ref 96–112)
Creatinine, Ser: 0.9 mg/dL (ref 0.40–1.20)
GFR: 64.13 mL/min (ref >60.00)
Glucose, Bld: 64 mg/dL — ABNORMAL LOW (ref 70–99)
Potassium: 3.9 mEq/L (ref 3.5–5.1)
Sodium: 137 mEq/L (ref 135–145)
Total Bilirubin: 0.4 mg/dL (ref 0.2–1.2)
Total Protein: 7.3 g/dL (ref 6.0–8.3)

## 2021-08-20 LAB — HEMOGLOBIN A1C: Hgb A1c MFr Bld: 6 % (ref 4.6–6.5)

## 2021-08-20 MED ORDER — METHYLPREDNISOLONE ACETATE 80 MG/ML IJ SUSP
80.0000 mg | Freq: Once | INTRAMUSCULAR | Status: AC
  Administered 2021-08-20: 12:00:00 80 mg via INTRAMUSCULAR

## 2021-08-20 MED ORDER — MELOXICAM 7.5 MG PO TABS: 7.5000 mg | ORAL_TABLET | Freq: Every day | ORAL | 1 refills | Status: DC

## 2021-08-20 NOTE — Patient Instructions (Signed)
Sciatica Sciatica is pain, numbness, weakness, or tingling along the path of the sciatic nerve. The sciatic nerve starts in the lower back and runs down the back of each leg. The nerve controls the muscles in the lower leg and in the back of the knee. It also provides feeling (sensation) to the back of the thigh, the lower leg, and the sole of the foot. Sciatica is a symptom of another medical condition that pinches or puts pressure on the sciatic nerve. Sciatica most often only affects one side of the body. Sciatica usually goes away on its own or with treatment. In some cases, sciatica may come back (recur). What are the causes? This condition is caused by pressure on the sciatic nerve or pinching of the nerve. This may be the result of: A disk in between the bones of the spine bulging out too far (herniated disk). Age-related changes in the spinal disks. A pain disorder that affects a muscle in the buttock. Extra bone growth near the sciatic nerve. A break (fracture) of the pelvis. Pregnancy. Tumor. This is rare. What increases the risk? The following factors may make you more likely to develop this condition: Playing sports that place pressure or stress on the spine. Having poor strength and flexibility. A history of back injury or surgery. Sitting for long periods of time. Doing activities that involve repetitive bending or lifting. Obesity. What are the signs or symptoms? Symptoms can vary from mild to very severe, and they may include: Any of these problems in the lower back, leg, hip, or buttock: Mild tingling, numbness, or dull aches. Burning sensations. Sharp pains. Numbness in the back of the calf or the sole of the foot. Leg weakness. Severe back pain that makes movement difficult. Symptoms may get worse when you cough, sneeze, or laugh, or when you sit or stand for long periods of time. How is this diagnosed? This condition may be diagnosed based on: Your symptoms and  medical history. A physical exam. Blood tests. Imaging tests, such as: X-rays. MRI. CT scan. How is this treated? In many cases, this condition improves on its own without treatment. However, treatment may include: Reducing or modifying physical activity. Exercising and stretching. Icing and applying heat to the affected area. Medicines that help to: Relieve pain and swelling. Relax your muscles. Injections of medicines that help to relieve pain, irritation, and inflammation around the sciatic nerve (steroids). Surgery. Follow these instructions at home: Medicines Take over-the-counter and prescription medicines only as told by your health care provider. Ask your health care provider if the medicine prescribed to you: Requires you to avoid driving or using heavy machinery. Can cause constipation. You may need to take these actions to prevent or treat constipation: Drink enough fluid to keep your urine pale yellow. Take over-the-counter or prescription medicines. Eat foods that are high in fiber, such as beans, whole grains, and fresh fruits and vegetables. Limit foods that are high in fat and processed sugars, such as fried or sweet foods. Managing pain   If directed, put ice on the affected area. Put ice in a plastic bag. Place a towel between your skin and the bag. Leave the ice on for 20 minutes, 2-3 times a day. If directed, apply heat to the affected area. Use the heat source that your health care provider recommends, such as a moist heat pack or a heating pad. Place a towel between your skin and the heat source. Leave the heat on for 20-30 minutes. Remove   the heat if your skin turns bright red. This is especially important if you are unable to feel pain, heat, or cold. You may have a greater risk of getting burned. Activity  Return to your normal activities as told by your health care provider. Ask your health care provider what activities are safe for you. Avoid  activities that make your symptoms worse. Take brief periods of rest throughout the day. When you rest for longer periods, mix in some mild activity or stretching between periods of rest. This will help to prevent stiffness and pain. Avoid sitting for long periods of time without moving. Get up and move around at least one time each hour. Exercise and stretch regularly, as told by your health care provider. Do not lift anything that is heavier than 10 lb (4.5 kg) while you have symptoms of sciatica. When you do not have symptoms, you should still avoid heavy lifting, especially repetitive heavy lifting. When you lift objects, always use proper lifting technique, which includes: Bending your knees. Keeping the load close to your body. Avoiding twisting. General instructions Maintain a healthy weight. Excess weight puts extra stress on your back. Wear supportive, comfortable shoes. Avoid wearing high heels. Avoid sleeping on a mattress that is too soft or too hard. A mattress that is firm enough to support your back when you sleep may help to reduce your pain. Keep all follow-up visits as told by your health care provider. This is important. Contact a health care provider if: You have pain that: Wakes you up when you are sleeping. Gets worse when you lie down. Is worse than you have experienced in the past. Lasts longer than 4 weeks. You have an unexplained weight loss. Get help right away if: You are not able to control when you urinate or have bowel movements (incontinence). You have: Weakness in your lower back, pelvis, buttocks, or legs that gets worse. Redness or swelling of your back. A burning sensation when you urinate. Summary Sciatica is pain, numbness, weakness, or tingling along the path of the sciatic nerve. This condition is caused by pressure on the sciatic nerve or pinching of the nerve. Sciatica can cause pain, numbness, or tingling in the lower back, legs, hips, and  buttocks. Treatment often includes rest, exercise, medicines, and applying ice or heat. This information is not intended to replace advice given to you by your health care provider. Make sure you discuss any questions you have with your health care provider. Document Revised: 10/09/2018 Document Reviewed: 10/09/2018 [employer]vier Patient Education  2022 [employer]  

## 2021-08-20 NOTE — Assessment & Plan Note (Signed)
Check A1c, CMET 

## 2021-08-20 NOTE — Assessment & Plan Note (Signed)
LLE radiculitis sx's Depo-medrol 80 mg IM Meloxicam po 7.5 mg po x 1 month RTC 6 wks

## 2021-08-20 NOTE — Progress Notes (Signed)
Subjective:  Patient ID: Tina Bird, female    DOB: 1950/10/15  Age: 70 y.o. MRN: [id number]  CC: Hip Pain (Left side.. ? arthritis)   HPI Tina Bird presents for L hip pain, LLE pain down the thigh worse w/sitting. Steroid shot helped before  Outpatient Medications Prior to Visit  Medication Sig Dispense Refill   acyclovir (ZOVIRAX) 400 MG tablet TAKE 1 TABLET(400 MG) BY MOUTH THREE TIMES DAILY FOR 10 DAYS 21 tablet 3   B Complex Vitamins (B COMPLEX-B12 PO) Take 1 tablet by mouth daily.     Cholecalciferol 1000 UNITS tablet Take 1,000 Units by mouth daily.     escitalopram (LEXAPRO) 5 MG tablet Take 1 tablet (5 mg total) by mouth daily. 90 tablet 3   fluticasone (FLONASE) 50 MCG/ACT nasal spray Place 2 sprays into both nostrils daily. 16 g 6   loratadine (CLARITIN) 10 MG tablet Take 10 mg by mouth daily.     magnesium oxide (MAG-OX) 400 MG tablet Take 400 mg by mouth daily.     omeprazole (PRILOSEC) 40 MG capsule TAKE 1 CAPSULE BY MOUTH EVERY DAY 90 capsule 3   triamterene-hydrochlorothiazide (MAXZIDE-25) 37.5-25 MG tablet TAKE 1/2 TABLET BY MOUTH DAILY 90 tablet 3   No facility-administered medications prior to visit.    ROS: Review of Systems  Constitutional:  Negative for activity change, appetite change, chills, fatigue and unexpected weight change.  HENT:  Negative for congestion, mouth sores and sinus pressure.   Eyes:  Negative for visual disturbance.  Respiratory:  Negative for cough and chest tightness.   Gastrointestinal:  Negative for abdominal pain and nausea.  Genitourinary:  Negative for difficulty urinating, frequency and vaginal pain.  Musculoskeletal:  Positive for arthralgias. Negative for back pain and gait problem.  Skin:  Negative for pallor and rash.  Neurological:  Negative for dizziness, tremors, weakness, numbness and headaches.  Psychiatric/Behavioral:  Negative for confusion and sleep disturbance.    Objective:  BP 128/76 (BP  Location: Left Arm)   Pulse 76   Temp 98.3 F (36.8 C) (Oral)   Ht 5\' 5"  (1.651 m)   Wt 154 lb 3.2 oz (69.9 kg)   SpO2 97%   BMI 25.66 kg/m   BP Readings from Last 3 Encounters:  08/20/21 128/76  05/08/21 120/82  02/19/21 120/66    Wt Readings from Last 3 Encounters:  08/20/21 154 lb 3.2 oz (69.9 kg)  05/08/21 156 lb 6.4 oz (70.9 kg)  02/19/21 157 lb 6.4 oz (71.4 kg)    Physical Exam Constitutional:      General: She is not in acute distress.    Appearance: She is well-developed.  HENT:     Head: Normocephalic.     Right Ear: External ear normal.     Left Ear: External ear normal.     Nose: Nose normal.  Eyes:     General:        Right eye: No discharge.        Left eye: No discharge.     Conjunctiva/sclera: Conjunctivae normal.     Pupils: Pupils are equal, round, and reactive to light.  Neck:     Thyroid: No thyromegaly.     Vascular: No JVD.     Trachea: No tracheal deviation.  Cardiovascular:     Rate and Rhythm: Normal rate and regular rhythm.     Heart sounds: Normal heart sounds.  Pulmonary:     Effort: No respiratory distress.  Breath sounds: No stridor. No wheezing.  Abdominal:     General: Bowel sounds are normal. There is no distension.     Palpations: Abdomen is soft. There is no mass.     Tenderness: There is no abdominal tenderness. There is no guarding or rebound.  Musculoskeletal:        General: No tenderness.     Cervical back: Normal range of motion and neck supple. No rigidity.  Lymphadenopathy:     Cervical: No cervical adenopathy.  Skin:    Findings: No erythema or rash.  Neurological:     Cranial Nerves: No cranial nerve deficit.     Motor: No abnormal muscle tone.     Coordination: Coordination normal.     Deep Tendon Reflexes: Reflexes normal.  Psychiatric:        Behavior: Behavior normal.        Thought Content: Thought content normal.        Judgment: Judgment normal.   Hip NT, buttock NT B Str leg elev (-) B Lab  Results  Component Value Date   WBC 4.7 01/02/2021   HGB 13.2 01/02/2021   HCT 38.6 01/02/2021   PLT 226.0 01/02/2021   GLUCOSE 64 (L) 08/20/2021   CHOL 222 (H) 05/01/2020   TRIG 291 (H) 05/01/2020   HDL 38 (L) 05/01/2020   LDLDIRECT 119.0 05/07/2019   LDLCALC 139 (H) 05/01/2020   ALT 24 08/20/2021   AST 25 08/20/2021   NA 137 08/20/2021   K 3.9 08/20/2021   CL 98 08/20/2021   CREATININE 0.90 08/20/2021   BUN 21 08/20/2021   CO2 33 (H) 08/20/2021   TSH 2.08 01/02/2021   HGBA1C 6.0 08/20/2021    MM 3D SCREEN BREAST BILATERAL  Result Date: 08/05/2021 CLINICAL DATA:  Screening. EXAM: DIGITAL SCREENING BILATERAL MAMMOGRAM WITH TOMOSYNTHESIS AND CAD TECHNIQUE: Bilateral screening digital craniocaudal and mediolateral oblique mammograms were obtained. Bilateral screening digital breast tomosynthesis was performed. The images were evaluated with computer-aided detection. COMPARISON:  Previous exam(s). ACR Breast Density Category b: There are scattered areas of fibroglandular density. FINDINGS: There are no findings suspicious for malignancy. IMPRESSION: No mammographic evidence of malignancy. A result letter of this screening mammogram will be mailed directly to the patient. RECOMMENDATION: Screening mammogram in one year. (Code:SM-B-01Y) BI-RADS CATEGORY  1: Negative. Electronically Signed   By: Fidela Salisbury M.D.   On: 08/05/2021 16:31    Assessment & Plan:   Problem List Items Addressed This Visit     Hyperglycemia    Check A1c, CMET      Radiculitis of leg     LLE radiculitis sx's Depo-medrol 80 mg IM Meloxicam po 7.5 mg po x 1 month RTC 6 wks      Well adult exam      Meds ordered this encounter  Medications   meloxicam (MOBIC) 7.5 MG tablet    Sig: Take 1 tablet (7.5 mg total) by mouth daily.    Dispense:  30 tablet    Refill:  1   methylPREDNISolone acetate (DEPO-MEDROL) injection 80 mg      Follow-up: Return in about 6 weeks (around 10/01/2021) for a  follow-up visit.  Walker Kehr, MD

## 2021-09-10 ENCOUNTER — Telehealth: Payer: Self-pay | Admitting: Internal Medicine

## 2021-09-10 MED ORDER — TRIAMTERENE-HCTZ 37.5-25 MG PO TABS: ORAL_TABLET | ORAL | 1 refills | Status: DC

## 2021-09-10 NOTE — Telephone Encounter (Signed)
Pt is up-to-date sent refill to [facility] 

## 2021-09-10 NOTE — Telephone Encounter (Signed)
1.Medication Requested: triamterene-hydrochlorothiazide (MAXZIDE-25) 37.5-25 MG tablet  2. Pharmacy (Name, [facility]): [facility], [address] AT [address]  3. On Med List: yes  4. Last Visit with PCP: 08-20-2021  5. Next visit date with PCP: n/a   Agent: Please be advised that RX refills may take up to 3 business days. We ask that you follow-up with your pharmacy.

## 2021-11-26 DIAGNOSIS — M9905 Segmental and somatic dysfunction of pelvic region: Secondary | ICD-10-CM | POA: Diagnosis not present

## 2021-11-26 DIAGNOSIS — M9902 Segmental and somatic dysfunction of thoracic region: Secondary | ICD-10-CM | POA: Diagnosis not present

## 2021-11-26 DIAGNOSIS — M5136 Other intervertebral disc degeneration, lumbar region: Secondary | ICD-10-CM | POA: Diagnosis not present

## 2021-11-26 DIAGNOSIS — M9903 Segmental and somatic dysfunction of lumbar region: Secondary | ICD-10-CM | POA: Diagnosis not present

## 2021-11-26 DIAGNOSIS — M5417 Radiculopathy, lumbosacral region: Secondary | ICD-10-CM | POA: Diagnosis not present

## 2021-12-02 DIAGNOSIS — M9902 Segmental and somatic dysfunction of thoracic region: Secondary | ICD-10-CM | POA: Diagnosis not present

## 2021-12-02 DIAGNOSIS — M9905 Segmental and somatic dysfunction of pelvic region: Secondary | ICD-10-CM | POA: Diagnosis not present

## 2021-12-02 DIAGNOSIS — M5136 Other intervertebral disc degeneration, lumbar region: Secondary | ICD-10-CM | POA: Diagnosis not present

## 2021-12-02 DIAGNOSIS — M5417 Radiculopathy, lumbosacral region: Secondary | ICD-10-CM | POA: Diagnosis not present

## 2021-12-02 DIAGNOSIS — M9903 Segmental and somatic dysfunction of lumbar region: Secondary | ICD-10-CM | POA: Diagnosis not present

## 2021-12-04 ENCOUNTER — Ambulatory Visit: Payer: Medicare Other

## 2021-12-04 DIAGNOSIS — I10 Essential (primary) hypertension: Secondary | ICD-10-CM

## 2021-12-04 DIAGNOSIS — E785 Hyperlipidemia, unspecified: Secondary | ICD-10-CM

## 2021-12-04 DIAGNOSIS — K219 Gastro-esophageal reflux disease without esophagitis: Secondary | ICD-10-CM

## 2021-12-04 NOTE — Progress Notes (Addendum)
Chronic Care Management Pharmacy Note  12/04/2021 Name:  Tina Bird MRN:  [id number] DOB:  Jul 01, 1951  Summary: -Patient reports that she is doing well since last visit -Has been given prescription for meloxicam due to hip pain, notes pain has been well controlled, has not had to use recently  -BP well controlled in office, patient has not been checking at home  -Reports compliance to current medications, denies any issues or concerns   Recommendations/Changes made from today's visit: -Patient to continue to monitor blood pressure 1-2 times weekly, will reach out should blood pressure be >140/90 or if she has any issues with hypotension -Advised for patient to receive shingrix vaccination and updated COVID booster with next visit to pharmacy  -Consider restarting atorvastatin as last LDL elevated at 139 - patient agreeable if PCP agrees to restart    Subjective: Tina Bird is an 71 y.o. year old female who is a primary patient of Plotnikov, Evie Lacks, MD.  The CCM team was consulted for assistance with disease management and care coordination needs.    Engaged with patient by telephone for follow up visit in response to provider referral for pharmacy case management and/or care coordination services.   Consent to Services:  The patient was given the following information about Chronic Care Management services today, agreed to services, and gave verbal consent: 1. CCM service includes personalized support from designated clinical staff supervised by the primary care provider, including individualized plan of care and coordination with other care providers 2. 24/7 contact phone numbers for assistance for urgent and routine care needs. 3. Service will only be billed when office clinical staff spend 20 minutes or more in a month to coordinate care. 4. Only one practitioner may furnish and bill the service in a calendar month. 5.The patient may stop CCM services at any time  (effective at the end of the month) by phone call to the office staff. 6. The patient will be responsible for cost sharing (co-pay) of up to 20% of the service fee (after annual deductible is met). Patient agreed to services and consent obtained.  Patient Care Team: Plotnikov, Evie Lacks, MD as PCP - General Carlean Purl Ofilia Neas, MD as Consulting Physician (Gastroenterology) Priyah Schmuck, Darnelle Maffucci, [facility] (Pharmacist) Delice Bison Darnelle Maffucci, [facility] as Pharmacist (Pharmacist) Warden Fillers, MD as Consulting Physician (Ophthalmology)  Recent office visits: 08/20/2021 - Dr. Alain Marion - hip pain - depo-medrol shot given - meloxicam rx'd - f/u in 6 weeks   Recent consult visits: None since last visit   Hospital visits: None in previous 6 months  Objective:  Lab Results  Component Value Date   CREATININE 0.90 08/20/2021   BUN 21 08/20/2021   GFR 64.13 08/20/2021   GFRNONAA >60 11/28/2018   GFRAA >60 11/28/2018   NA 137 08/20/2021   K 3.9 08/20/2021   CALCIUM 9.8 08/20/2021   CO2 33 (H) 08/20/2021   GLUCOSE 64 (L) 08/20/2021   Lab Results  Component Value Date/Time   HGBA1C 6.0 08/20/2021 08:50 AM   HGBA1C 6.0 01/02/2021 11:45 AM   GFR 64.13 08/20/2021 08:50 AM   GFR 68.02 01/02/2021 11:45 AM    Last diabetic Eye exam:  Lab Results  Component Value Date/Time   HMDIABEYEEXA No Retinopathy 01/19/2021 12:00 AM    Last diabetic Foot exam:  No results found for: HMDIABFOOTEX   Lab Results  Component Value Date   CHOL 222 (H) 05/01/2020   HDL 38 (L) 05/01/2020   LDLCALC 139 (H)  05/01/2020   LDLDIRECT 119.0 05/07/2019   TRIG 291 (H) 05/01/2020   CHOLHDL 5.8 (H) 05/01/2020    Hepatic Function Latest Ref Rng & Units 08/20/2021 01/02/2021 05/07/2019  Total Protein 6.0 - 8.3 g/dL 7.3 7.0 6.9  Albumin 3.5 - 5.2 g/dL 4.5 4.2 4.3  AST 0 - 37 U/L 25 22 16   ALT 0 - 35 U/L 24 22 17   Alk Phosphatase 39 - 117 U/L 70 70 73  Total Bilirubin 0.2 - 1.2 mg/dL 0.4 0.3 0.3  Bilirubin, Direct 0.0 - 0.3 mg/dL  - - 0.1    Lab Results  Component Value Date/Time   TSH 2.08 01/02/2021 11:45 AM   TSH 1.12 05/07/2019 09:43 AM    CBC Latest Ref Rng & Units 01/02/2021 05/07/2019 08/15/2017  WBC 4.0 - 10.5 K/uL 4.7 4.6 5.0  Hemoglobin 12.0 - 15.0 g/dL 13.2 12.9 13.2  Hematocrit 36.0 - 46.0 % 38.6 38.8 39.9  Platelets 150.0 - 400.0 K/uL 226.0 215.0 234.0    Lab Results  Component Value Date/Time   VD25OH 41 06/10/2010 06:09 PM    Clinical ASCVD: No  The 10-year ASCVD risk score (Arnett DK, et al., 2019) is: 16.4%   Values used to calculate the score:     Age: 71 years     Sex: Female     Is Non-Hispanic African American: No     Diabetic: No     Tobacco smoker: No     Systolic Blood Pressure: 458 mmHg     Is BP treated: Yes     HDL Cholesterol: 38 mg/dL     Total Cholesterol: 222 mg/dL    Depression screen [facility] 2/9 05/08/2021 02/20/2021 02/19/2021  Decreased Interest 0 0 0  Down, Depressed, Hopeless 0 1 0  PHQ - 2 Score 0 1 0  Altered sleeping 0 - -  Tired, decreased energy 0 - -  Change in appetite 0 - -  Feeling bad or failure about yourself  0 - -  Trouble concentrating 0 - -  Moving slowly or fidgety/restless 0 - -  Suicidal thoughts 0 - -  PHQ-9 Score 0 - -     Social History   Tobacco Use  Smoking Status Former   Packs/day: 0.50   Years: 30.00   Pack years: 15.00   Types: Cigarettes   Quit date: 10/04/2001   Years since quitting: 20.1  Smokeless Tobacco Never   BP Readings from Last 3 Encounters:  08/20/21 128/76  05/08/21 120/82  02/19/21 120/66   Pulse Readings from Last 3 Encounters:  08/20/21 76  05/08/21 73  02/19/21 75   Wt Readings from Last 3 Encounters:  08/20/21 154 lb 3.2 oz (69.9 kg)  05/08/21 156 lb 6.4 oz (70.9 kg)  02/19/21 157 lb 6.4 oz (71.4 kg)   BMI Readings from Last 3 Encounters:  08/20/21 25.66 kg/m  05/08/21 26.03 kg/m  02/19/21 26.19 kg/m    Assessment/Interventions: Review of patient past medical history, allergies, medications,  health status, including review of consultants reports, laboratory and other test data, was performed as part of comprehensive evaluation and provision of chronic care management services.   SDOH:  (Social Determinants of Health) assessments and interventions performed: Yes  SDOH Screenings   Alcohol Screen: Low Risk    Last Alcohol Screening Score (AUDIT): 0  Depression (PHQ2-9): Low Risk    PHQ-2 Score: 0  Financial Resource Strain: Low Risk    Difficulty of Paying Living Expenses: Not hard at all  Food Insecurity: No Food Insecurity   Worried About Charity fundraiser in the Last Year: Never true   Arboriculturist in the Last Year: Never true  Housing: Low Risk    Last Housing Risk Score: 0  Physical Activity: Sufficiently Active   Days of Exercise per Week: 5 days   Minutes of Exercise per Session: 30 min  Social Connections: Engineer, building services of Communication with Friends and Family: More than three times a week   Frequency of Social Gatherings with Friends and Family: Once a week   Attends Religious Services: 1 to 4 times per year   Active Member of [employer] or Organizations: No   Attends Music therapist: 1 to 4 times per year   Marital Status: Married  Stress: No Stress Concern Present   Feeling of Stress : Not at all  Tobacco Use: Medium Risk   Smoking Tobacco Use: Former   Smokeless Tobacco Use: Never   Passive Exposure: Not on Pensions consultant Needs: No Transportation Needs   Lack of Transportation (Medical): No   Lack of Transportation (Non-Medical): No    CCM Care Plan  Allergies  Allergen Reactions   Poison Ivy Extract     Medications Reviewed Today     Reviewed by Tomasa Blase, [facility] (Pharmacist) on 12/04/21 at 1138  Med List Status: <None>   Medication Order Taking? Sig Documenting Provider Last Dose Status Informant  acyclovir (ZOVIRAX) 400 MG tablet [id number] Yes TAKE 1 TABLET(400 MG) BY MOUTH THREE TIMES DAILY FOR 10  DAYS Plotnikov, Evie Lacks, MD Taking Active   B Complex Vitamins (B COMPLEX-B12 PO) [id number] Yes Take 1 tablet by mouth daily. [provider] Taking Active   Cholecalciferol 1000 UNITS tablet [id number] Yes Take 1,000 Units by mouth daily. [provider] Taking Active   escitalopram (LEXAPRO) 5 MG tablet [id number] Yes Take 1 tablet (5 mg total) by mouth daily. Plotnikov, Evie Lacks, MD Taking Active   fluticasone (FLONASE) 50 MCG/ACT nasal spray [id number] Yes Place 2 sprays into both nostrils daily. Marrian Salvage, FNP Taking Active   loratadine (CLARITIN) 10 MG tablet [id number] Yes Take 10 mg by mouth daily. [provider] Taking Active   magnesium oxide (MAG-OX) 400 MG tablet [id number] Yes Take 400 mg by mouth daily. [provider] Taking Active   meloxicam (MOBIC) 7.5 MG tablet [id number] Yes Take 1 tablet (7.5 mg total) by mouth daily. Plotnikov, Evie Lacks, MD Taking Active   omeprazole (PRILOSEC) 40 MG capsule [id number] Yes TAKE 1 CAPSULE BY MOUTH EVERY DAY Plotnikov, Evie Lacks, MD Taking Active   triamterene-hydrochlorothiazide (MAXZIDE-25) 37.5-25 MG tablet [id number] Yes TAKE 1/2 TABLET BY MOUTH DAILY Plotnikov, Evie Lacks, MD Taking Active             Patient Active Problem List   Diagnosis Date Noted   Radiculitis of leg 08/20/2021   Hyperglycemia 08/20/2021   Right-sided headache 07/16/2020   Trochanteric bursitis of left hip 03/24/2020   GERD (gastroesophageal reflux disease) 03/24/2020   Anxiety disorder 03/24/2020   Urinary tract infection 01/31/2020   Osteopenia 01/25/2019   Dermatochalasis of both upper eyelids 07/06/2018   Upper respiratory infection 04/05/2018   Conjunctivitis 04/05/2018   Sciatic leg pain 01/25/2018   Pharyngitis 08/30/2017   Contact dermatitis 01/26/2017   Neuropathy 12/07/2016   Essential hypertension, benign 05/25/2016   Dyslipidemia 05/25/2016   Acute appendicitis 01/25/2016   Rash and nonspecific  skin eruption 04/22/2015  Near syncope 11/05/2013   Heart murmur 11/05/2013   Well adult exam 10/30/2012   Internal hemorrhoids with prolapse & pain 07/12/2012   IBS (irritable bowel syndrome) 07/12/2012   Diverticulosis of colon with sigmoid stricture 07/12/2012   Cough 03/07/2012   Asthmatic bronchitis 03/07/2012   Hip pain, chronic 12/14/2011   Glossal erythema 12/14/2011   Osteoarthritis 07/27/2011   HIP PAIN 12/02/2010   MENOPAUSAL SYNDROME 06/02/2010   DIZZINESS 06/02/2010   LOW BACK PAIN 05/13/2007    Immunization History  Administered Date(s) Administered   Influenza Split 07/27/2011, 08/29/2012   Influenza Whole 06/02/2010   Influenza, High Dose Seasonal PF 07/15/2016, 07/11/2017   Influenza,inj,Quad PF,6+ Mos 09/12/2013, 07/26/2014, 07/02/2015, 06/25/2018, 06/19/2019, 06/16/2021   Influenza-Unspecified 06/21/2020   Moderna SARS-COV2 Booster Vaccination 02/15/2021   Moderna Sars-Covid-2 Vaccination 11/19/2019, 12/18/2019, 07/27/2020   Pneumococcal Conjugate-13 08/09/2018   Pneumococcal Polysaccharide-23 05/07/2016   Td 06/04/2009   Tdap 05/01/2020   Zoster, Live 08/11/2011    Conditions to be addressed/monitored:  Hypertension, Hyperlipidemia, GERD, Depression, Allergic Rhinitis and Chronic left hip pain   Care Plan : CCM Care plan  Updates made by Tomasa Blase, RPH since 12/04/2021 12:00 AM     Problem: HTN, HLD, Depression, GERD, Allergic Rhinitis   Priority: High  Onset Date: 02/20/2021     Long-Range Goal: Disease Management   Start Date: 02/20/2021  Expected End Date: 12/05/2022  This Visit's Progress: On track  Recent Progress: On track  Priority: High  Note:   Current Barriers:  Unable to independently monitor therapeutic efficacy  Pharmacist Clinical Goal(s):  Patient will achieve adherence to monitoring guidelines and medication adherence to achieve therapeutic efficacy achieve control of LDL  as evidenced by next lipid panel maintain  control of BP as evidenced by continued BP logs in goal range through collaboration with PharmD and provider.   Interventions: 1:1 collaboration with Plotnikov, Evie Lacks, MD regarding development and update of comprehensive plan of care as evidenced by provider attestation and co-signature Inter-disciplinary care team collaboration (see longitudinal plan of care) Comprehensive medication review performed; medication list updated in electronic medical record  Hypertension (BP goal <140/90) -Controlled -Current treatment: Triamterene-HCTZ - 37.5-25mg - 1/2 tablet daily  -Medications previously tried: n/a  -Current home readings: has not been checking at home recently   BP Readings from Last 3 Encounters:  08/20/21 128/76  05/08/21 120/82  02/19/21 120/66  -Current dietary habits: notes that she watches sodium and caffeine intake  -Current exercise habits: walks at the [facility] / gardening Reports occasional hypotensive/ symptoms - feeling of passing out that lasts for about 2-3 seconds - has never had any syncopal episodes - recommended for patient to ensure she is properly hydrated throughout the day / monitor BP to check for hypotensive BP readings - patient to reach out should she occurrence increase / if she has any issues with hypotension -Educated on BP goals and benefits of medications for prevention of heart attack, stroke and kidney damage; Daily salt intake goal < 2300 mg; Exercise goal of 150 minutes per week; Symptoms of hypotension and importance of maintaining adequate hydration; -Counseled to monitor BP at home three times weekly, document, and provide log at future appointments -Counseled on diet and exercise extensively Recommended to continue current medication  Hyperlipidemia: (LDL goal < 100) -Not ideally controlled  Lab Results  Component Value Date   LDLCALC 139 (H) 05/01/2020  -Current treatment: ASA 69m daily -Medications previously tried: atorvastatin 193m   -Current dietary  patterns: avoiding excessive fried / fatty foods / red meat consumption -Current exercise habits: walking at the [facility] often, gardening as well -Educated on Cholesterol goals;  Benefits of statin for ASCVD risk reduction; Importance of limiting foods high in cholesterol; -Counseled on diet and exercise extensively Recommended for patient to re-trial atorvastatin 9m daily - denies any issues with medication in the past  -Patient to discuss with PCP / have updated lipid panel with next viist   Depression/Anxiety (Goal: Mood control / prevention of disease progression) -Controlled -Current treatment: Escitalopram 526mdaily  -Medications previously tried/failed: citalopram, venlafaxine  -PHQ9: 0 -Educated on Benefits of medication for symptom control -Recommended to continue current medication   Allergic Rhinitis (Goal: Allergy control / flare prevention) -Controlled -Current treatment  Fluticasone 506m/ act 1-2 sprays into each nostril daily  Loratadine 5m51mily  -Medications previously tried: loratadine, cetirizine   -Recommended to continue current medication   GERD (Goal: Acid control / flare prevention) -Controlled -Current treatment  Omeprazole 40mg15mly  -Medications previously tried: Famotidine  -Counseled on diet and exercise extensively Recommended to continue current medication  Health Maintenance -Current therapy:  Acyclovir 400mg 59mD x 10 days - prn cold sores  Cholecalciferol 1000 units daily  Vitamin B complex daily Magnesium Oxide 400mg d26m   -Educated on Herbal supplement research is limited and benefits usually cannot be proven Cost vs benefit of each product must be carefully weighed by individual consumer -Patient is satisfied with current therapy and denies issues -Recommended to continue current medication  Patient Goals/Self-Care Activities Patient will:  - take medications as prescribed check blood pressure 1-2 times  weekly , document, and provide at future appointments       Medication Assistance: None required.  Patient affirms current coverage meets needs.  [id number] [id number] [phone] pill box? Yes Pt endorses 100% compliance   Care Plan and Follow Up Patient Decision:  Patient agrees to Care Plan and Follow-up.  Plan: Telephone follow up appointment with care management team member scheduled for:  4 months  and The patient has been provided with contact information for the care management team and has been advised to call with any health related questions or concerns.    Kameah Rawl Tomasa BlaseD Clinical Pharmacist, [place] examination/treatment/procedure(s) were performed by non-physician practitioner and as supervising physician I was immediately available for consultation/collaboration.  I agree with above. AlekseiLew Dawes

## 2021-12-04 NOTE — Patient Instructions (Signed)
Visit Information ? ?Following are the goals we discussed today:  ? ?Track and Manage My Blood Pressure  ? ?Timeframe:  Long-Range Goal ?Priority:  Medium ?Start Date: 02/20/2021                            ?Expected End Date:  07/31/2022                    ? ?Follow Up Date 07/2022 ?  ?- check blood pressure 3 times per week  ?  ?Why is this important?   ?You won't feel high blood pressure, but it can still hurt your blood vessels.  ?High blood pressure can cause heart or kidney problems. It can also cause a stroke.  ?Making lifestyle changes like losing a little weight or eating less salt will help.  ?Checking your blood pressure at home and at different times of the day can help to control blood pressure.  ?If the doctor prescribes medicine remember to take it the way the doctor ordered.  ?Call the office if you cannot afford the medicine or if there are questions about it.  ? ?Plan: Telephone follow up appointment with care management team member scheduled for:  6 months ?The patient has been provided with contact information for the care management team and has been advised to call with any health related questions or concerns.  ? ?Tomasa Blase, PharmD ?Clinical Pharmacist, [facility]  ? ?Please call the care guide team at [phone] if you need to cancel or reschedule your appointment.  ? ?Patient verbalizes understanding of instructions and care plan provided today and agrees to view in [place]. Active MyChart status confirmed with patient.   ? ?

## 2021-12-09 DIAGNOSIS — M9903 Segmental and somatic dysfunction of lumbar region: Secondary | ICD-10-CM | POA: Diagnosis not present

## 2021-12-09 DIAGNOSIS — M5136 Other intervertebral disc degeneration, lumbar region: Secondary | ICD-10-CM | POA: Diagnosis not present

## 2021-12-09 DIAGNOSIS — M9902 Segmental and somatic dysfunction of thoracic region: Secondary | ICD-10-CM | POA: Diagnosis not present

## 2021-12-09 DIAGNOSIS — M5417 Radiculopathy, lumbosacral region: Secondary | ICD-10-CM | POA: Diagnosis not present

## 2021-12-09 DIAGNOSIS — M9905 Segmental and somatic dysfunction of pelvic region: Secondary | ICD-10-CM | POA: Diagnosis not present

## 2022-02-16 ENCOUNTER — Telehealth: Payer: Self-pay

## 2022-02-16 NOTE — Telephone Encounter (Signed)
Pt is requesting a refill on: ?escitalopram (LEXAPRO) 5 MG tablet ? ?Pt states she was going through some empty bottles throwing them out and she accidentally threw away the full bottle of her Rx. ? ?Pharmacy: ?[facility] [place] [place] [facility] ? ?LOV 08/20/21 ?ROV 03/08/22 ? ?

## 2022-02-17 NOTE — Telephone Encounter (Signed)
I advised pt that Dr. Alain Marion would be returning to the office on Friday 5/19. Due to it being Controlled [facility] couldn't process the refill.

## 2022-02-17 NOTE — Telephone Encounter (Signed)
Noted.  Okay to renew early.  Thanks ?

## 2022-02-19 ENCOUNTER — Ambulatory Visit (INDEPENDENT_AMBULATORY_CARE_PROVIDER_SITE_OTHER): Payer: Medicare Other | Admitting: Family Medicine

## 2022-02-19 ENCOUNTER — Encounter: Payer: Self-pay | Admitting: Family Medicine

## 2022-02-19 VITALS — BP 120/68 | HR 90 | Temp 97.7°F | Ht 65.0 in | Wt 155.0 lb

## 2022-02-19 DIAGNOSIS — R059 Cough, unspecified: Secondary | ICD-10-CM | POA: Diagnosis not present

## 2022-02-19 DIAGNOSIS — R011 Cardiac murmur, unspecified: Secondary | ICD-10-CM | POA: Diagnosis not present

## 2022-02-19 DIAGNOSIS — Z1211 Encounter for screening for malignant neoplasm of colon: Secondary | ICD-10-CM

## 2022-02-19 DIAGNOSIS — J029 Acute pharyngitis, unspecified: Secondary | ICD-10-CM

## 2022-02-19 LAB — POCT RAPID STREP A (OFFICE): Rapid Strep A Screen: NEGATIVE

## 2022-02-19 LAB — POC COVID19 BINAXNOW: SARS Coronavirus 2 Ag: NEGATIVE

## 2022-02-19 MED ORDER — ESCITALOPRAM OXALATE 5 MG PO TABS: 5.0000 mg | ORAL_TABLET | Freq: Every day | ORAL | 3 refills | Status: DC

## 2022-02-19 MED ORDER — BENZONATATE 200 MG PO CAPS: 200.0000 mg | ORAL_CAPSULE | Freq: Two times a day (BID) | ORAL | 0 refills | Status: DC | PRN

## 2022-02-19 NOTE — Patient Instructions (Signed)
You are negative for COVID and strep throat today in the office.  Your symptoms are most likely related to a viral illness.  I have prescribed Tessalon Perles for cough. I also recommend Mucinex for chest congestion and cough.  I recommend salt water gargles, Tylenol or ibuprofen, Chloraseptic and warm fluids for sore throat.  Follow-up if you are getting much worse or not feeling better in the next 3 to 4 days.

## 2022-02-19 NOTE — Telephone Encounter (Signed)
Okay.  Done.  Thanks 

## 2022-02-19 NOTE — Assessment & Plan Note (Signed)
Known murmur. Asymptomatic

## 2022-02-19 NOTE — Assessment & Plan Note (Addendum)
24-hour history of symptoms.  Negative home COVID test and negative COVID test in the office today.   Negative strep.  Suspect viral illness.  Discussed symptomatic treatment including salt water gargles, Mucinex, Tylenol or ibuprofen, good hydration and rest.  She will follow-up if worsening

## 2022-02-19 NOTE — Progress Notes (Signed)
Subjective:     Patient ID: Tina Bird, female    DOB: 1951-07-21, 71 y.o.   MRN: [id number]  Chief Complaint  Patient presents with   Headache   Sore Throat    Started yesterday and is effecting her sleep, accompanied by sneezing and dry coughing.  COVID negative 5/18    Headache   Sore Throat  Associated symptoms include headaches.  Patient is in today for a 24 hour hx of sore throat, sneezing, runny nose headache and dry cough.  Her grandson was sick and staying with her earlier this week.  Negative Covid test at home.   Taking Allegra for allergies.   Denies hx of pulmonary disease.   No fever, chills, dizziness, chest pain, palpitations, shortness of breath, abdominal pain, N/V/D, LE edema.   Known murmur. Has follow up with PCP soon per patient.     Health Maintenance Due  Topic Date Due   Zoster Vaccines- Shingrix (1 of 2) Never done   COLONOSCOPY (Pts 45-14yr Insurance coverage will need to be confirmed)  07/28/2020    Past Medical History:  Diagnosis Date   Asthmatic bronchitis 09-08-12   03-07-12 bronchits x1   Essential hypertension, benign 05/25/2016   GERD (gastroesophageal reflux disease)    LBP (low back pain)    [place]    Right leg and Right breast   Menopausal symptoms    Dr. MRee Edman  Osteoarthritis 07/27/2011   left knee   Vertigo     Past Surgical History:  Procedure Laterality Date   BREAST EXCISIONAL BIOPSY Right    skin CA   BROW LIFT Bilateral 11/30/2018   Procedure: BLEPHAROPLASTY;  Surgeon: DWallace Going DO;  [facility] [facility]  Service: Plastics;  Laterality: Bilateral;  90 min surgery time - per provider   CHOLECYSTECTOMY     laparoscopic   COLONOSCOPY  2011   DILATION AND CURETTAGE OF UTERUS     LAPAROSCOPIC APPENDECTOMY N/A 01/25/2016   Procedure: APPENDECTOMY LAPAROSCOPIC;  Surgeon: FStark Klein MD;  [facility];  Service: General;  Laterality: N/A;   PEXY  09/11/2012    Procedure: PEXY;  Surgeon: SAdin Hector MD;  Location: [facility];  Service: General;  Laterality: N/A;   TONSILLECTOMY     TRANSANAL HEMORRHOIDAL DEARTERIALIZATION  09/11/2012   Procedure: TRANSANAL HEMORRHOIDAL DEARTERIALIZATION;  Surgeon: SAdin Hector MD;  Location: [facility];  Service: General;  Laterality: N/A;  [place]   TUBAL LIGATION      Family History  Problem Relation Age of Onset   Heart disease Brother    Diabetes Mother    Hypertension Mother    Stroke Sister    Hypertension Other    Alzheimer's disease Other     Social History   Socioeconomic History   Marital status: Married    Spouse name: Not on file   Number of children: Not on file   Years of education: Not on file   Highest education level: Not on file  Occupational History   Not on file  Tobacco Use   Smoking status: Former    Packs/day: 0.50    Years: 30.00    Pack years: 15.00    Types: Cigarettes    Quit date: 10/04/2001    Years since quitting: 20.3   Smokeless tobacco: Never  Vaping Use   Vaping Use: Never used  Substance and Sexual Activity   Alcohol use: Yes    Alcohol/week: 1.0  standard drink    Types: 1 Glasses of wine per week    Comment: rare wine   Drug use: No   Sexual activity: Yes  Other Topics Concern   Not on file  Social History Narrative   Regular Exercise- no   Social Determinants of Health   Financial Resource Strain: Low Risk    Difficulty of Paying Living Expenses: Not hard at all  Food Insecurity: No Food Insecurity   Worried About Charity fundraiser in the Last Year: Never true   Ran Out of Food in the Last Year: Never true  Transportation Needs: No Transportation Needs   Lack of Transportation (Medical): No   Lack of Transportation (Non-Medical): No  Physical Activity: Sufficiently Active   Days of Exercise per Week: 5 days   Minutes of Exercise per Session: 30 min  Stress: No Stress Concern Present   Feeling of Stress : Not at all   Social Connections: Socially Integrated   Frequency of Communication with Friends and Family: More than three times a week   Frequency of Social Gatherings with Friends and Family: Once a week   Attends Religious Services: 1 to 4 times per year   Active Member of [employer] or Organizations: No   Attends Music therapist: 1 to 4 times per year   Marital Status: Married  Human resources officer Violence: Not on file    Outpatient Medications Prior to Visit  Medication Sig Dispense Refill   acyclovir (ZOVIRAX) 400 MG tablet TAKE 1 TABLET(400 MG) BY MOUTH THREE TIMES DAILY FOR 10 DAYS 21 tablet 3   B Complex Vitamins (B COMPLEX-B12 PO) Take 1 tablet by mouth daily.     Cholecalciferol 1000 UNITS tablet Take 1,000 Units by mouth daily.     escitalopram (LEXAPRO) 5 MG tablet Take 1 tablet (5 mg total) by mouth daily. 90 tablet 3   fluticasone (FLONASE) 50 MCG/ACT nasal spray Place 2 sprays into both nostrils daily. 16 g 6   loratadine (CLARITIN) 10 MG tablet Take 10 mg by mouth daily.     magnesium oxide (MAG-OX) 400 MG tablet Take 400 mg by mouth daily.     omeprazole (PRILOSEC) 40 MG capsule TAKE 1 CAPSULE BY MOUTH EVERY DAY 90 capsule 3   triamterene-hydrochlorothiazide (MAXZIDE-25) 37.5-25 MG tablet TAKE 1/2 TABLET BY MOUTH DAILY 90 tablet 1   meloxicam (MOBIC) 7.5 MG tablet Take 1 tablet (7.5 mg total) by mouth daily. 30 tablet 1   No facility-administered medications prior to visit.    Allergies  Allergen Reactions   Poison Ivy Extract     Review of Systems  Neurological:  Positive for headaches.      Objective:    Physical Exam Constitutional:      General: She is not in acute distress.    Appearance: She is ill-appearing.  Eyes:     Extraocular Movements: Extraocular movements intact.     Pupils: Pupils are equal, round, and reactive to light.  Cardiovascular:     Rate and Rhythm: Normal rate and regular rhythm.     Heart sounds: Murmur heard.  Pulmonary:      Effort: Pulmonary effort is normal.     Breath sounds: Examination of the right-lower field reveals wheezing. Wheezing present.     Comments: Wheezing RLL, CTA otherwise  Abdominal:     Palpations: Abdomen is soft.  Musculoskeletal:        General: Normal range of motion.     Cervical  back: Normal range of motion and neck supple.     Right lower leg: No edema.     Left lower leg: No edema.  Lymphadenopathy:     Cervical: No cervical adenopathy.  Skin:    General: Skin is warm and dry.  Neurological:     Mental Status: She is alert and oriented to person, place, and time.     Motor: Motor function is intact.     Coordination: Coordination is intact.  Psychiatric:        Mood and Affect: Mood normal.        Speech: Speech normal.        Behavior: Behavior normal.    BP 120/68 (BP Location: Left Arm, Patient Position: Sitting, Cuff Size: Large)   Pulse 90   Temp 97.7 F (36.5 C) (Temporal)   Ht '5\' 5"'$  (1.651 m)   Wt 155 lb (70.3 kg)   SpO2 97%   BMI 25.79 kg/m  Wt Readings from Last 3 Encounters:  02/19/22 155 lb (70.3 kg)  08/20/21 154 lb 3.2 oz (69.9 kg)  05/08/21 156 lb 6.4 oz (70.9 kg)       Assessment & Plan:   Problem List Items Addressed This Visit       Respiratory   Upper respiratory infection - Primary    24-hour history of symptoms.  Negative home COVID test and negative COVID test in the office today.   Negative strep.  Suspect viral illness.  Discussed symptomatic treatment including salt water gargles, Mucinex, Tylenol or ibuprofen, good hydration and rest.  She will follow-up if worsening         Other   Cough    No acute distress.  She may try over-the-counter medications.  Tessalon Perles prescribed       Relevant Medications   benzonatate (TESSALON) 200 MG capsule   Other Relevant Orders   POC COVID-19 (Completed)   POCT rapid strep A   Heart murmur    Known murmur. Asymptomatic        Other Visit Diagnoses     Sore throat        Relevant Orders   POC COVID-19 (Completed)   POCT rapid strep A   Screen for colon cancer       Relevant Orders   Ambulatory referral to Gastroenterology       I have discontinued Wandra Arthurs. [facility]'s meloxicam. I am also having her start on benzonatate. Additionally, I am having her maintain her Cholecalciferol, fluticasone, loratadine, magnesium oxide, B Complex Vitamins (B COMPLEX-B12 PO), acyclovir, omeprazole, triamterene-hydrochlorothiazide, and escitalopram.  Meds ordered this encounter  Medications   benzonatate (TESSALON) 200 MG capsule    Sig: Take 1 capsule (200 mg total) by mouth 2 (two) times daily as needed for cough.    Dispense:  20 capsule    Refill:  0    Order Specific Question:   Supervising Provider    Answer:   Pricilla Holm A [[id number]]

## 2022-02-19 NOTE — Assessment & Plan Note (Signed)
No acute distress.  She may try over-the-counter medications.  Tessalon Perles prescribed

## 2022-02-26 ENCOUNTER — Ambulatory Visit: Payer: Medicare Other

## 2022-03-08 ENCOUNTER — Encounter: Payer: Self-pay | Admitting: Internal Medicine

## 2022-03-08 ENCOUNTER — Ambulatory Visit (INDEPENDENT_AMBULATORY_CARE_PROVIDER_SITE_OTHER): Payer: Medicare Other | Admitting: Internal Medicine

## 2022-03-08 VITALS — BP 130/90 | HR 69 | Temp 97.8°F | Ht 65.0 in | Wt 158.0 lb

## 2022-03-08 DIAGNOSIS — Z0000 Encounter for general adult medical examination without abnormal findings: Secondary | ICD-10-CM

## 2022-03-08 DIAGNOSIS — R011 Cardiac murmur, unspecified: Secondary | ICD-10-CM

## 2022-03-08 DIAGNOSIS — F419 Anxiety disorder, unspecified: Secondary | ICD-10-CM | POA: Diagnosis not present

## 2022-03-08 DIAGNOSIS — E785 Hyperlipidemia, unspecified: Secondary | ICD-10-CM

## 2022-03-08 DIAGNOSIS — M8580 Other specified disorders of bone density and structure, unspecified site: Secondary | ICD-10-CM | POA: Diagnosis not present

## 2022-03-08 DIAGNOSIS — K219 Gastro-esophageal reflux disease without esophagitis: Secondary | ICD-10-CM

## 2022-03-08 MED ORDER — ESCITALOPRAM OXALATE 5 MG PO TABS: 5.0000 mg | ORAL_TABLET | Freq: Every day | ORAL | 3 refills | Status: DC

## 2022-03-08 NOTE — Assessment & Plan Note (Signed)
Cont on Vit D 

## 2022-03-08 NOTE — Assessment & Plan Note (Signed)
Chronic. Will get an ECHO

## 2022-03-08 NOTE — Assessment & Plan Note (Signed)
Cont on Lexapro 

## 2022-03-08 NOTE — Progress Notes (Signed)
Subjective:  Patient ID: Tina Bird, female    DOB: Jan 08, 1951  Age: 71 y.o. MRN: [id number]  CC: No chief complaint on file.   HPI Tina Bird presents for anxiety, GERD, heart murmur  Outpatient Medications Prior to Visit  Medication Sig Dispense Refill   acyclovir (ZOVIRAX) 400 MG tablet TAKE 1 TABLET(400 MG) BY MOUTH THREE TIMES DAILY FOR 10 DAYS 21 tablet 3   B Complex Vitamins (B COMPLEX-B12 PO) Take 1 tablet by mouth daily.     Cholecalciferol 1000 UNITS tablet Take 1,000 Units by mouth daily.     fluticasone (FLONASE) 50 MCG/ACT nasal spray Place 2 sprays into both nostrils daily. 16 g 6   loratadine (CLARITIN) 10 MG tablet Take 10 mg by mouth daily.     omeprazole (PRILOSEC) 40 MG capsule TAKE 1 CAPSULE BY MOUTH EVERY DAY 90 capsule 3   triamterene-hydrochlorothiazide (MAXZIDE-25) 37.5-25 MG tablet TAKE 1/2 TABLET BY MOUTH DAILY 90 tablet 1   benzonatate (TESSALON) 200 MG capsule Take 1 capsule (200 mg total) by mouth 2 (two) times daily as needed for cough. 20 capsule 0   escitalopram (LEXAPRO) 5 MG tablet Take 1 tablet (5 mg total) by mouth daily. 90 tablet 3   magnesium oxide (MAG-OX) 400 MG tablet Take 400 mg by mouth daily.     No facility-administered medications prior to visit.    ROS: Review of Systems  Constitutional:  Negative for activity change, appetite change, chills, fatigue and unexpected weight change.  HENT:  Negative for congestion, mouth sores and sinus pressure.   Eyes:  Negative for visual disturbance.  Respiratory:  Negative for cough and chest tightness.   Gastrointestinal:  Negative for abdominal pain and nausea.  Genitourinary:  Negative for difficulty urinating, frequency and vaginal pain.  Musculoskeletal:  Negative for back pain and gait problem.  Skin:  Negative for pallor and rash.  Neurological:  Positive for light-headedness. Negative for dizziness, tremors, syncope, weakness, numbness and headaches.   Psychiatric/Behavioral:  Negative for confusion, sleep disturbance and suicidal ideas. The patient is nervous/anxious.    Objective:  BP 130/90 (BP Location: Left Arm, Patient Position: Sitting, Cuff Size: Normal)   Pulse 69   Temp 97.8 F (36.6 C) (Oral)   Ht '5\' 5"'$  (1.651 m)   Wt 158 lb (71.7 kg)   SpO2 97%   BMI 26.29 kg/m   BP Readings from Last 3 Encounters:  03/08/22 130/90  02/19/22 120/68  08/20/21 128/76    Wt Readings from Last 3 Encounters:  03/08/22 158 lb (71.7 kg)  02/19/22 155 lb (70.3 kg)  08/20/21 154 lb 3.2 oz (69.9 kg)    Physical Exam Constitutional:      General: She is not in acute distress.    Appearance: Normal appearance. She is well-developed.  HENT:     Head: Normocephalic.     Right Ear: External ear normal.     Left Ear: External ear normal.     Nose: Nose normal.  Eyes:     General:        Right eye: No discharge.        Left eye: No discharge.     Conjunctiva/sclera: Conjunctivae normal.     Pupils: Pupils are equal, round, and reactive to light.  Neck:     Thyroid: No thyromegaly.     Vascular: No JVD.     Trachea: No tracheal deviation.  Cardiovascular:     Rate and Rhythm: Normal rate and  regular rhythm.     Heart sounds: Murmur heard.  Pulmonary:     Effort: No respiratory distress.     Breath sounds: No stridor. No wheezing.  Abdominal:     General: Bowel sounds are normal. There is no distension.     Palpations: Abdomen is soft. There is no mass.     Tenderness: There is no abdominal tenderness. There is no guarding or rebound.  Musculoskeletal:        General: No tenderness.     Cervical back: Normal range of motion and neck supple. No rigidity.  Lymphadenopathy:     Cervical: No cervical adenopathy.  Skin:    Findings: No erythema or rash.  Neurological:     Cranial Nerves: No cranial nerve deficit.     Motor: No abnormal muscle tone.     Coordination: Coordination normal.     Deep Tendon Reflexes: Reflexes  normal.  Psychiatric:        Behavior: Behavior normal.        Thought Content: Thought content normal.        Judgment: Judgment normal.  1-2 murmur  Lab Results  Component Value Date   WBC 4.7 01/02/2021   HGB 13.2 01/02/2021   HCT 38.6 01/02/2021   PLT 226.0 01/02/2021   GLUCOSE 64 (L) 08/20/2021   CHOL 222 (H) 05/01/2020   TRIG 291 (H) 05/01/2020   HDL 38 (L) 05/01/2020   LDLDIRECT 119.0 05/07/2019   LDLCALC 139 (H) 05/01/2020   ALT 24 08/20/2021   AST 25 08/20/2021   NA 137 08/20/2021   K 3.9 08/20/2021   CL 98 08/20/2021   CREATININE 0.90 08/20/2021   BUN 21 08/20/2021   CO2 33 (H) 08/20/2021   TSH 2.08 01/02/2021   HGBA1C 6.0 08/20/2021    MM 3D SCREEN BREAST BILATERAL  Result Date: 08/05/2021 CLINICAL DATA:  Screening. EXAM: DIGITAL SCREENING BILATERAL MAMMOGRAM WITH TOMOSYNTHESIS AND CAD TECHNIQUE: Bilateral screening digital craniocaudal and mediolateral oblique mammograms were obtained. Bilateral screening digital breast tomosynthesis was performed. The images were evaluated with computer-aided detection. COMPARISON:  Previous exam(s). ACR Breast Density Category b: There are scattered areas of fibroglandular density. FINDINGS: There are no findings suspicious for malignancy. IMPRESSION: No mammographic evidence of malignancy. A result letter of this screening mammogram will be mailed directly to the patient. RECOMMENDATION: Screening mammogram in one year. (Code:SM-B-01Y) BI-RADS CATEGORY  1: Negative. Electronically Signed   By: Fidela Salisbury M.D.   On: 08/05/2021 16:31    Assessment & Plan:   Problem List Items Addressed This Visit     Anxiety disorder    Cont on Lexapro      Relevant Medications   escitalopram (LEXAPRO) 5 MG tablet   Other Relevant Orders   TSH   Urinalysis   Comprehensive metabolic panel   Dyslipidemia   Relevant Orders   TSH   Lipid panel   GERD (gastroesophageal reflux disease)    Cont on Omeprazole      Heart murmur -  Primary    Chronic. Will get an ECHO       Relevant Orders   ECHOCARDIOGRAM COMPLETE   Osteopenia    Cont on Vit D       Well adult exam   Relevant Orders   TSH   Urinalysis   CBC with Differential/Platelet   Lipid panel   Comprehensive metabolic panel      Meds ordered this encounter  Medications   escitalopram (LEXAPRO) 5 MG  tablet    Sig: Take 1 tablet (5 mg total) by mouth daily.    Dispense:  90 tablet    Refill:  3      Follow-up: Return in about 6 months (around 09/07/2022) for Wellness Exam.  Walker Kehr, MD

## 2022-03-08 NOTE — Assessment & Plan Note (Signed)
Cont on Omeprazole 

## 2022-03-12 ENCOUNTER — Ambulatory Visit: Payer: Medicare Other

## 2022-03-12 ENCOUNTER — Telehealth: Payer: Self-pay

## 2022-03-12 NOTE — Telephone Encounter (Signed)
Patient scheduled for AWV-S via telephone at 11:30 a.m.  Left voicemail to return call to [phone].

## 2022-03-26 ENCOUNTER — Ambulatory Visit: Payer: Medicare Other

## 2022-04-07 ENCOUNTER — Ambulatory Visit (HOSPITAL_COMMUNITY): Payer: Medicare Other | Attending: Cardiology

## 2022-04-07 DIAGNOSIS — R011 Cardiac murmur, unspecified: Secondary | ICD-10-CM | POA: Insufficient documentation

## 2022-04-07 LAB — ECHOCARDIOGRAM COMPLETE
Area-P 1/2: 2.73 cm2
S' Lateral: 2.3 cm

## 2022-04-09 ENCOUNTER — Telehealth: Payer: Self-pay | Admitting: Cardiology

## 2022-04-09 NOTE — Telephone Encounter (Signed)
Patient called wanting to get results from Echo test.

## 2022-04-09 NOTE — Telephone Encounter (Signed)
Returned call to patient who states she was calling for her Echo results. Advised patient that these have not been reviewed yet by Dr. Percival Spanish  but once reviewed we will be contact with her about these. Patient verbalized understanding.

## 2022-04-12 NOTE — Telephone Encounter (Signed)
Returned call to patient who states she was told to see Dr. Percival Spanish by her PCP- advised her that no referral was seen in chart. Advised her to call her PCP for results and to discuss referral if that is needed. Patient verbalized understanding.

## 2022-04-13 NOTE — Telephone Encounter (Signed)
Tina Bird, I am sorry.  I think, you were contacted in error.  I sent [name] a MyChart message on 04/07/2022.  Thank you, AP

## 2022-04-16 ENCOUNTER — Ambulatory Visit (INDEPENDENT_AMBULATORY_CARE_PROVIDER_SITE_OTHER): Payer: Medicare Other

## 2022-04-16 DIAGNOSIS — Z0000 Encounter for general adult medical examination without abnormal findings: Secondary | ICD-10-CM | POA: Diagnosis not present

## 2022-04-16 DIAGNOSIS — Z1211 Encounter for screening for malignant neoplasm of colon: Secondary | ICD-10-CM

## 2022-04-16 NOTE — Progress Notes (Addendum)
Subjective:   Tina Bird is a 71 y.o. female who presents for Medicare Annual (Subsequent) preventive examination.   I connected with Tina Bird  today by telephone and verified that I am speaking with the correct person using two identifiers. Location patient: home Location provider: work Persons participating in the virtual visit: patient, provider.   I discussed the limitations, risks, security and privacy concerns of performing an evaluation and management service by telephone and the availability of in person appointments. I also discussed with the patient that there may be a patient responsible charge related to this service. The patient expressed understanding and verbally consented to this telephonic visit.    Interactive audio and video telecommunications were attempted between this provider and patient, however failed, due to patient having technical difficulties OR patient did not have access to video capability.  We continued and completed visit with audio only.    Review of Systems     Cardiac Risk Factors include: advanced age (>2mn, >>74women)     Objective:    Today's Vitals   There is no height or weight on file to calculate BMI.     04/16/2022    8:14 AM 02/19/2021   11:31 AM 11/23/2018    9:45 AM 08/09/2018    1:04 PM 04/14/2017    9:41 AM 01/25/2016    1:22 PM 09/06/2012   10:56 AM  Advanced Directives  Does Patient Have a Medical Advance Directive? Yes No No No No No Patient does not have advance directive  Type of AScientist, forensicPower of [place]Living will        Copy of [place] Chart? No - copy requested        Would patient like information on creating a medical advance directive?  No - Patient declined  Yes (ED - Information included in AVS) No - Patient declined    Pre-existing out of facility DNR order (yellow form or pink MOST form)       No    Current Medications (verified) Outpatient  Encounter Medications as of 04/16/2022  Medication Sig   acyclovir (ZOVIRAX) 400 MG tablet TAKE 1 TABLET(400 MG) BY MOUTH THREE TIMES DAILY FOR 10 DAYS   B Complex Vitamins (B COMPLEX-B12 PO) Take 1 tablet by mouth daily.   Cholecalciferol 1000 UNITS tablet Take 1,000 Units by mouth daily.   escitalopram (LEXAPRO) 5 MG tablet Take 1 tablet (5 mg total) by mouth daily.   fluticasone (FLONASE) 50 MCG/ACT nasal spray Place 2 sprays into both nostrils daily.   loratadine (CLARITIN) 10 MG tablet Take 10 mg by mouth daily.   omeprazole (PRILOSEC) 40 MG capsule TAKE 1 CAPSULE BY MOUTH EVERY DAY   triamterene-hydrochlorothiazide (MAXZIDE-25) 37.5-25 MG tablet TAKE 1/2 TABLET BY MOUTH DAILY   No facility-administered encounter medications on file as of 04/16/2022.    Allergies (verified) Poison ivy extract   History: Past Medical History:  Diagnosis Date   Asthmatic bronchitis 09-08-12   03-07-12 bronchits x1   Essential hypertension, benign 05/25/2016   GERD (gastroesophageal reflux disease)    LBP (low back pain)    Melanoma (HCC)    Right leg and Right breast   Menopausal symptoms    Dr. MRee Edman  Osteoarthritis 07/27/2011   left knee   Vertigo    Past Surgical History:  Procedure Laterality Date   BREAST EXCISIONAL BIOPSY Right    skin CA   BROW LIFT Bilateral 11/30/2018  Procedure: BLEPHAROPLASTY;  Surgeon: Wallace Going, DO;  Location: [place];  Service: Plastics;  Laterality: Bilateral;  90 min surgery time - per provider   CHOLECYSTECTOMY     laparoscopic   COLONOSCOPY  2011   DILATION AND CURETTAGE OF UTERUS     LAPAROSCOPIC APPENDECTOMY N/A 01/25/2016   Procedure: APPENDECTOMY LAPAROSCOPIC;  Surgeon: Stark Klein, MD;  Location: [facility];  Service: General;  Laterality: N/A;   PEXY  09/11/2012   Procedure: PEXY;  Surgeon: Adin Hector, MD;  Location: [facility];  Service: General;  Laterality: N/A;   TONSILLECTOMY     TRANSANAL HEMORRHOIDAL  DEARTERIALIZATION  09/11/2012   Procedure: TRANSANAL HEMORRHOIDAL DEARTERIALIZATION;  Surgeon: Adin Hector, MD;  Location: [facility];  Service: General;  Laterality: N/A;  [place] hemorrhoidal ligation/pexy   TUBAL LIGATION     Family History  Problem Relation Age of Onset   Heart disease Brother    Diabetes Mother    Hypertension Mother    Stroke Sister    Hypertension Other    Alzheimer's disease Other    Social History   Socioeconomic History   Marital status: Married    Spouse name: Not on file   Number of children: Not on file   Years of education: Not on file   Highest education level: Not on file  Occupational History   Not on file  Tobacco Use   Smoking status: Former    Packs/day: 0.50    Years: 30.00    Total pack years: 15.00    Types: Cigarettes    Quit date: 10/04/2001    Years since quitting: 20.5   Smokeless tobacco: Never  Vaping Use   Vaping Use: Never used  Substance and Sexual Activity   Alcohol use: Yes    Alcohol/week: 1.0 standard drink of alcohol    Types: 1 Glasses of wine per week    Comment: rare wine   Drug use: No   Sexual activity: Yes  Other Topics Concern   Not on file  Social History Narrative   Regular Exercise- no   Social Determinants of Health   Financial Resource Strain: Low Risk  (04/16/2022)   Overall Financial Resource Strain (CARDIA)    Difficulty of Paying Living Expenses: Not hard at all  Food Insecurity: No Food Insecurity (04/16/2022)   Hunger Vital Sign    Worried About Running Out of Food in the Last Year: Never true    Ran Out of Food in the Last Year: Never true  Transportation Needs: No Transportation Needs (04/16/2022)   PRAPARE - Hydrologist (Medical): No    Lack of Transportation (Non-Medical): No  Physical Activity: Sufficiently Active (04/16/2022)   Exercise Vital Sign    Days of Exercise per Week: 7 days    Minutes of Exercise per Session: 40 min  Stress: No Stress Concern Present  (04/16/2022)   [place]    Feeling of Stress : Not at all  Social Connections: Moderately Integrated (04/16/2022)   Social Connection and Isolation Panel [NHANES]    Frequency of Communication with Friends and Family: Three times a week    Frequency of Social Gatherings with Friends and Family: Three times a week    Attends Religious Services: More than 4 times per year    Active Member of Clubs or Organizations: No    Attends Archivist Meetings: Never  Marital Status: Married    Tobacco Counseling Counseling given: Not Answered   Clinical Intake:  Pre-visit preparation completed: Yes  Pain : No/denies pain     Nutritional Risks: None Diabetes: No  How often do you need to have someone help you when you read instructions, pamphlets, or other written materials from your doctor or pharmacy?: 1 - Never What is the last grade level you completed in school?: High School  Diabetic?no   Interpreter Needed?: No  Information entered by :: [place] of Daily Living    04/16/2022    8:19 AM  In your present state of health, do you have any difficulty performing the following activities:  Hearing? 0  Vision? 0  Difficulty concentrating or making decisions? 0  Walking or climbing stairs? 0  Dressing or bathing? 0  Doing errands, shopping? 0  Preparing Food and eating ? N  Using the Toilet? N  In the past six months, have you accidently leaked urine? N  Do you have problems with loss of bowel control? N  Managing your Medications? N  Managing your Finances? N  Housekeeping or managing your Housekeeping? N    Patient Care Team: Plotnikov, Evie Lacks, MD as PCP - General Carlean Purl Ofilia Neas, MD as Consulting Physician (Gastroenterology) Szabat, Darnelle Maffucci, [facility] (Inactive) (Pharmacist) Szabat, Darnelle Maffucci, [facility] (Inactive) as Pharmacist (Pharmacist) Warden Fillers, MD as Consulting  Physician (Ophthalmology)  Indicate any recent Medical Services you may have received from other than [facility] providers in the past year (date may be approximate).     Assessment:   This is a routine wellness examination for Yeraldin.  Hearing/Vision screen Vision Screening - Comments:: Annual eye exams wear glasses   Dietary issues and exercise activities discussed: Current Exercise Habits: Home exercise routine, Type of exercise: walking, Time (Minutes): 40, Frequency (Times/Week): 3, Weekly Exercise (Minutes/Week): 120, Intensity: Mild, Exercise limited by: None identified   Goals Addressed   None    Depression Screen    04/16/2022    8:15 AM 04/16/2022    8:13 AM 02/19/2022   10:19 AM 05/08/2021   11:14 AM 02/20/2021   11:06 AM 02/19/2021   11:06 AM 01/31/2020   10:26 AM  PHQ 2/9 Scores  PHQ - 2 Score 0 0 0 0 1 0 0  PHQ- 9 Score    0       Fall Risk    04/16/2022    8:15 AM 02/19/2022   10:19 AM 05/08/2021   11:14 AM 02/19/2021   11:06 AM 01/02/2021   10:59 AM  Fall Risk   Falls in the past year? 0 0 0 0 0  Number falls in past yr: 0 0 0 0 0  Injury with Fall? 0 0 0 0 0  Risk for fall due to :  No Fall Risks No Fall Risks No Fall Risks   Follow up Falls evaluation completed;Education provided Falls evaluation completed  Falls evaluation completed Falls evaluation completed    [place]:  Any stairs in or around the home? No  If so, are there any without handrails? No  Home free of loose throw rugs in walkways, pet beds, electrical cords, etc? Yes  Adequate lighting in your home to reduce risk of falls? Yes   ASSISTIVE DEVICES UTILIZED TO PREVENT FALLS:  Life alert? No  Use of a cane, walker or w/c? No  Grab bars in the bathroom? Yes  Shower chair or bench  in shower? Yes  Elevated toilet seat or a handicapped toilet? No    Cognitive Function:    Normal cognitive status assessed by telephone conversation  by this Nurse Health Advisor.  No abnormalities found.      Immunizations Immunization History  Administered Date(s) Administered   Influenza Split 07/27/2011, 08/29/2012   Influenza Whole 06/02/2010   Influenza, High Dose Seasonal PF 07/15/2016, 07/11/2017   Influenza,inj,Quad PF,6+ Mos 09/12/2013, 07/26/2014, 07/02/2015, 06/25/2018, 06/19/2019, 06/16/2021   Influenza-Unspecified 06/21/2020   Moderna SARS-COV2 Booster Vaccination 02/15/2021   Moderna Sars-Covid-2 Vaccination 11/19/2019, 12/18/2019, 07/27/2020   Pneumococcal Conjugate-13 08/09/2018   Pneumococcal Polysaccharide-23 05/07/2016   Td 06/04/2009   Tdap 05/01/2020   Zoster, Live 08/11/2011    TDAP status: Up to date  Flu Vaccine status: Up to date  Pneumococcal vaccine status: Up to date  Covid-19 vaccine status: Completed vaccines  Qualifies for Shingles Vaccine? Yes   Zostavax completed No   Shingrix Completed?: No.    Education has been provided regarding the importance of this vaccine. Patient has been advised to call insurance company to determine out of pocket expense if they have not yet received this vaccine. Advised may also receive vaccine at local pharmacy or Health Dept. Verbalized acceptance and understanding.  Screening Tests Health Maintenance  Topic Date Due   Zoster Vaccines- Shingrix (1 of 2) Never done   COLONOSCOPY (Pts 45-57yr Insurance coverage will need to be confirmed)  07/28/2020   COVID-19 Vaccine (4 - Moderna series) 04/12/2021   INFLUENZA VACCINE  05/04/2022   MAMMOGRAM  08/05/2022   TETANUS/TDAP  05/01/2030   Pneumonia Vaccine 71 Years old  Completed   DEXA SCAN  Completed   Hepatitis C Screening  Completed   HPV VACCINES  Aged Out    Health Maintenance  Health Maintenance Due  Topic Date Due   Zoster Vaccines- Shingrix (1 of 2) Never done   COLONOSCOPY (Pts 45-487yrInsurance coverage will need to be confirmed)  07/28/2020   COVID-19 Vaccine (4 - Moderna series) 04/12/2021    Colorectal cancer  screening: Referral to GI placed 04/15/2022. Pt aware the office will call re: appt.  Mammogram status: Completed 08/05/2021. Repeat every year  Bone Density status: Completed 10/24/2018. Results reflect: Bone density results: OSTEOPENIA. Repeat every 5 years.  Lung Cancer Screening: (Low Dose CT Chest recommended if Age 73-80ears, 30 pack-year currently smoking OR have quit w/in 15years.) does not qualify.   Lung Cancer Screening Referral: n/a  Additional Screening:  Hepatitis C Screening: does not qualify;   Vision Screening: Recommended annual ophthalmology exams for early detection of glaucoma and other disorders of the eye. Is the patient up to date with their annual eye exam?  Yes  Who is the provider or what is the name of the office in which the patient attends annual eye exams? Dr.Groat  If pt is not established with a provider, would they like to be referred to a provider to establish care? No .   Dental Screening: Recommended annual dental exams for proper oral hygiene  Community Resource Referral / Chronic Care Management: CRR required this visit?  No   CCM required this visit?  No      Plan:     I have personally reviewed and noted the following in the patient's chart:   Medical and social history Use of alcohol, tobacco or illicit drugs  Current medications and supplements including opioid prescriptions.  Functional ability and status Nutritional status Physical activity Advanced directives  List of other physicians Hospitalizations, surgeries, and ER visits in previous 12 months Vitals Screenings to include cognitive, depression, and falls Referrals and appointments  In addition, I have reviewed and discussed with patient certain preventive protocols, quality metrics, and best practice recommendations. A written personalized care plan for preventive services as well as general preventive health recommendations were provided to patient.     Randel Pigg, LPN   05/28/1897   Nurse Notes: none   Medical screening examination/treatment/procedure(s) were performed by non-physician practitioner and as supervising physician I was immediately available for consultation/collaboration.  I agree with above. Lew Dawes, MD

## 2022-04-16 NOTE — Patient Instructions (Signed)
[name] , Thank you for taking time to come for your Medicare Wellness Visit. I appreciate your ongoing commitment to your health goals. Please review the following plan we discussed and let me know if I can assist you in the future.   Screening recommendations/referrals: Colonoscopy: referral 04/15/2022 Mammogram: 08/05/2021 Bone Density: 10/24/2018 Recommended yearly ophthalmology/optometry visit for glaucoma screening and checkup Recommended yearly dental visit for hygiene and checkup  Vaccinations: Influenza vaccine: completed  Pneumococcal vaccine: completed  Tdap vaccine: 05/01/2020 Shingles vaccine: will consider     Advanced directives: yes   Conditions/risks identified: none   Next appointment: none    Preventive Care 56 Years and Older, Female Preventive care refers to lifestyle choices and visits with your health care provider that can promote health and wellness. What does preventive care include? A yearly physical exam. This is also called an annual well check. Dental exams once or twice a year. Routine eye exams. Ask your health care provider how often you should have your eyes checked. Personal lifestyle choices, including: Daily care of your teeth and gums. Regular physical activity. Eating a healthy diet. Avoiding tobacco and drug use. Limiting alcohol use. Practicing safe sex. Taking low-dose aspirin every day. Taking vitamin and mineral supplements as recommended by your health care provider. What happens during an annual well check? The services and screenings done by your health care provider during your annual well check will depend on your age, overall health, lifestyle risk factors, and family history of disease. Counseling  Your health care provider may ask you questions about your: Alcohol use. Tobacco use. Drug use. Emotional well-being. Home and relationship well-being. Sexual activity. Eating habits. History of falls. Memory and  ability to understand (cognition). Work and work Statistician. Reproductive health. Screening  You may have the following tests or measurements: Height, weight, and BMI. Blood pressure. Lipid and cholesterol levels. These may be checked every 5 years, or more frequently if you are over 65 years old. Skin check. Lung cancer screening. You may have this screening every year starting at age 31 if you have a 30-pack-year history of smoking and currently smoke or have quit within the past 15 years. Fecal occult blood test (FOBT) of the stool. You may have this test every year starting at age 109. Flexible sigmoidoscopy or colonoscopy. You may have a sigmoidoscopy every 5 years or a colonoscopy every 10 years starting at age 10. Hepatitis C blood test. Hepatitis B blood test. Sexually transmitted disease (STD) testing. Diabetes screening. This is done by checking your blood sugar (glucose) after you have not eaten for a while (fasting). You may have this done every 1-3 years. Bone density scan. This is done to screen for osteoporosis. You may have this done starting at age 32. Mammogram. This may be done every 1-2 years. Talk to your health care provider about how often you should have regular mammograms. Talk with your health care provider about your test results, treatment options, and if necessary, the need for more tests. Vaccines  Your health care provider may recommend certain vaccines, such as: Influenza vaccine. This is recommended every year. Tetanus, diphtheria, and acellular pertussis (Tdap, Td) vaccine. You may need a Td booster every 10 years. Zoster vaccine. You may need this after age 26. Pneumococcal 13-valent conjugate (PCV13) vaccine. One dose is recommended after age 81. Pneumococcal polysaccharide (PPSV23) vaccine. One dose is recommended after age 67. Talk to your health care provider about which screenings and vaccines you need and how  often you need them. This information is  not intended to replace advice given to you by your health care provider. Make sure you discuss any questions you have with your health care provider. Document Released: 10/17/2015 Document Revised: 06/09/2016 Document Reviewed: 07/22/2015 Elsevier Interactive Patient Education  2017 [place] Prevention in the Home Falls can cause injuries. They can happen to people of all ages. There are many things you can do to make your home safe and to help prevent falls. What can I do on the outside of my home? Regularly fix the edges of walkways and driveways and fix any cracks. Remove anything that might make you trip as you walk through a door, such as a raised step or threshold. Trim any bushes or trees on the path to your home. Use bright outdoor lighting. Clear any walking paths of anything that might make someone trip, such as rocks or tools. Regularly check to see if handrails are loose or broken. Make sure that both sides of any steps have handrails. Any raised decks and porches should have guardrails on the edges. Have any leaves, snow, or ice cleared regularly. Use sand or salt on walking paths during winter. Clean up any spills in your garage right away. This includes oil or grease spills. What can I do in the bathroom? Use night lights. Install grab bars by the toilet and in the tub and shower. Do not use towel bars as grab bars. Use non-skid mats or decals in the tub or shower. If you need to sit down in the shower, use a plastic, non-slip stool. Keep the floor dry. Clean up any water that spills on the floor as soon as it happens. Remove soap buildup in the tub or shower regularly. Attach bath mats securely with double-sided non-slip rug tape. Do not have throw rugs and other things on the floor that can make you trip. What can I do in the bedroom? Use night lights. Make sure that you have a light by your bed that is easy to reach. Do not use any sheets or blankets that  are too big for your bed. They should not hang down onto the floor. Have a firm chair that has side arms. You can use this for support while you get dressed. Do not have throw rugs and other things on the floor that can make you trip. What can I do in the kitchen? Clean up any spills right away. Avoid walking on wet floors. Keep items that you use a lot in easy-to-reach places. If you need to reach something above you, use a strong step stool that has a grab bar. Keep electrical cords out of the way. Do not use floor polish or wax that makes floors slippery. If you must use wax, use non-skid floor wax. Do not have throw rugs and other things on the floor that can make you trip. What can I do with my stairs? Do not leave any items on the stairs. Make sure that there are handrails on both sides of the stairs and use them. Fix handrails that are broken or loose. Make sure that handrails are as long as the stairways. Check any carpeting to make sure that it is firmly attached to the stairs. Fix any carpet that is loose or worn. Avoid having throw rugs at the top or bottom of the stairs. If you do have throw rugs, attach them to the floor with carpet tape. Make sure that you have a  light switch at the top of the stairs and the bottom of the stairs. If you do not have them, ask someone to add them for you. What else can I do to help prevent falls? Wear shoes that: Do not have high heels. Have rubber bottoms. Are comfortable and fit you well. Are closed at the toe. Do not wear sandals. If you use a stepladder: Make sure that it is fully opened. Do not climb a closed stepladder. Make sure that both sides of the stepladder are locked into place. Ask someone to hold it for you, if possible. Clearly mark and make sure that you can see: Any grab bars or handrails. First and last steps. Where the edge of each step is. Use tools that help you move around (mobility aids) if they are needed. These  include: Canes. Walkers. Scooters. Crutches. Turn on the lights when you go into a dark area. Replace any light bulbs as soon as they burn out. Set up your furniture so you have a clear path. Avoid moving your furniture around. If any of your floors are uneven, fix them. If there are any pets around you, be aware of where they are. Review your medicines with your doctor. Some medicines can make you feel dizzy. This can increase your chance of falling. Ask your doctor what other things that you can do to help prevent falls. This information is not intended to replace advice given to you by your health care provider. Make sure you discuss any questions you have with your health care provider. Document Released: 07/17/2009 Document Revised: 02/26/2016 Document Reviewed: 10/25/2014 Elsevier Interactive Patient Education  2017 [employer].

## 2022-05-16 ENCOUNTER — Other Ambulatory Visit: Payer: Self-pay | Admitting: Internal Medicine

## 2022-05-18 ENCOUNTER — Encounter: Payer: Self-pay | Admitting: Internal Medicine

## 2022-06-14 ENCOUNTER — Other Ambulatory Visit: Payer: Self-pay

## 2022-06-14 ENCOUNTER — Ambulatory Visit (AMBULATORY_SURGERY_CENTER): Payer: Self-pay | Admitting: *Deleted

## 2022-06-14 VITALS — Ht 65.0 in | Wt 155.0 lb

## 2022-06-14 DIAGNOSIS — Z1211 Encounter for screening for malignant neoplasm of colon: Secondary | ICD-10-CM

## 2022-06-14 NOTE — Progress Notes (Signed)
Patient completed pre visit in person  No egg or soy allergy known to patient  No issues known to pt with past sedation with any surgeries or procedures Patient denies ever being told they had issues or difficulty with intubation  No FH of Malignant Hyperthermia Pt is not on diet pills Pt is not on  home 02  Pt is not on blood thinners  Pt denies issues with constipation  No A fib or A flutter  Pt instructed to use [email or web address].com or GoodRx for a price reduction on prep

## 2022-06-30 ENCOUNTER — Encounter: Payer: Self-pay | Admitting: Internal Medicine

## 2022-07-09 ENCOUNTER — Telehealth: Payer: Medicare Other

## 2022-07-14 ENCOUNTER — Ambulatory Visit (AMBULATORY_SURGERY_CENTER): Payer: Medicare Other | Admitting: Internal Medicine

## 2022-07-14 ENCOUNTER — Encounter: Payer: Self-pay | Admitting: Internal Medicine

## 2022-07-14 VITALS — BP 143/59 | HR 179 | Temp 97.3°F | Resp 10 | Ht 65.0 in | Wt 155.0 lb

## 2022-07-14 DIAGNOSIS — D123 Benign neoplasm of transverse colon: Secondary | ICD-10-CM

## 2022-07-14 DIAGNOSIS — Z1211 Encounter for screening for malignant neoplasm of colon: Secondary | ICD-10-CM

## 2022-07-14 DIAGNOSIS — K635 Polyp of colon: Secondary | ICD-10-CM | POA: Diagnosis not present

## 2022-07-14 MED ORDER — SODIUM CHLORIDE 0.9 % IV SOLN: 500.0000 mL | Freq: Once | INTRAVENOUS | Status: DC

## 2022-07-14 NOTE — Patient Instructions (Addendum)
I found and removed one very small polyp that looks benign.  You also have a condition called diverticulosis - common and not usually a problem. Please read the handout provided.  I will let you know pathology results and when to have another routine colonoscopy by mail and/or My Chart.  I appreciate the opportunity to care for you. Gatha Mayer, MD, [facility]   Handouts on polyps & diverticulosis given to you today  Await pathology results on polyps removed    YOU HAD AN ENDOSCOPIC PROCEDURE TODAY AT [facility]:   Refer to the procedure report that was given to you for any specific questions about what was found during the examination.  If the procedure report does not answer your questions, please call your gastroenterologist to clarify.  If you requested that your care partner not be given the details of your procedure findings, then the procedure report has been included in a sealed envelope for you to review at your convenience later.  YOU SHOULD EXPECT: Some feelings of bloating in the abdomen. Passage of more gas than usual.  Walking can help get rid of the air that was put into your GI tract during the procedure and reduce the bloating. If you had a lower endoscopy (such as a colonoscopy or flexible sigmoidoscopy) you may notice spotting of blood in your stool or on the toilet paper. If you underwent a bowel prep for your procedure, you may not have a normal bowel movement for a few days.  Please Note:  You might notice some irritation and congestion in your nose or some drainage.  This is from the oxygen used during your procedure.  There is no need for concern and it should clear up in a day or so.  SYMPTOMS TO REPORT IMMEDIATELY:  Following lower endoscopy (colonoscopy or flexible sigmoidoscopy):  Excessive amounts of blood in the stool  Significant tenderness or worsening of abdominal pains  Swelling of the abdomen that is new, acute  Fever of 100F or  higher   For urgent or emergent issues, a gastroenterologist can be reached at any hour by calling [phone]. Do not use MyChart messaging for urgent concerns.    DIET:  We do recommend a small meal at first, but then you may proceed to your regular diet.  Drink plenty of fluids but you should avoid alcoholic beverages for 24 hours.  ACTIVITY:  You should plan to take it easy for the rest of today and you should NOT DRIVE or use heavy machinery until tomorrow (because of the sedation medicines used during the test).    FOLLOW UP: Our staff will call the number listed on your records the next business day following your procedure.  We will call around 7:15- 8:00 am to check on you and address any questions or concerns that you may have regarding the information given to you following your procedure. If we do not reach you, we will leave a message.     If any biopsies were taken you will be contacted by phone or by letter within the next 1-3 weeks.  Please call us at [phone] if you have not heard about the biopsies in 3 weeks.    SIGNATURES/CONFIDENTIALITY: You and/or your care partner have signed paperwork which will be entered into your electronic medical record.  These signatures attest to the fact that that the information above on your After Visit Summary has been reviewed and is understood.  Full responsibility of  the confidentiality of this discharge information lies with you and/or your care-partner.

## 2022-07-14 NOTE — Progress Notes (Signed)
Called to room to assist during endoscopic procedure.  Patient ID and intended procedure confirmed with present staff. Received instructions for my participation in the procedure from the performing physician.  

## 2022-07-14 NOTE — Progress Notes (Signed)
Pt resting comfortably. VSS. Airway intact. SBAR complete to RN. All questions answered.   

## 2022-07-14 NOTE — Progress Notes (Signed)
[facility] History and Physical   Primary Care Physician:  Cassandria Anger, MD   Reason for Procedure:   CRCA screen  Plan:    colonoscopy     HPI: Tina Bird is a 71 y.o. female w/ prior colonoscopy 2011 - 2 distal hyperplastic polyps, diverticulosis w/ severe angulation. For repeat screening exam.   Past Medical History:  Diagnosis Date   Asthmatic bronchitis 09/08/2012   03-07-12 bronchits x1   Cataract    Essential hypertension, benign 05/25/2016   GERD (gastroesophageal reflux disease)    LBP (low back pain)    Melanoma (HCC)    Right leg and Right breast   Menopausal symptoms    Dr. Ree Edman   Osteoarthritis 07/27/2011   left knee   Vertigo     Past Surgical History:  Procedure Laterality Date   BREAST EXCISIONAL BIOPSY Right    skin CA   BROW LIFT Bilateral 11/30/2018   Procedure: BLEPHAROPLASTY;  Surgeon: Wallace Going, DO;  Location: [facility];  Service: Plastics;  Laterality: Bilateral;  90 min surgery time - per provider   CHOLECYSTECTOMY     laparoscopic   COLONOSCOPY  2011   [place] N/A 01/25/2016   Procedure: APPENDECTOMY LAPAROSCOPIC;  Surgeon: Stark Klein, MD;  Location: [facility];  Service: General;  Laterality: N/A;   PEXY  09/11/2012   Procedure: PEXY;  Surgeon: Adin Hector, MD;  Location: [facility];  Service: General;  Laterality: N/A;   TONSILLECTOMY     TRANSANAL HEMORRHOIDAL DEARTERIALIZATION  09/11/2012   Procedure: TRANSANAL HEMORRHOIDAL DEARTERIALIZATION;  Surgeon: Adin Hector, MD;  Location: [facility];  Service: General;  Laterality: N/A;  [place] hemorrhoidal ligation/pexy   TUBAL LIGATION      Prior to Admission medications   Medication Sig Start Date End Date Taking? Authorizing Provider  acetaminophen (TYLENOL) 500 MG tablet Take 500 mg by mouth every 6 (six) hours as needed.    [provider]  acyclovir (ZOVIRAX) 400 MG  tablet TAKE 1 TABLET(400 MG) BY MOUTH THREE TIMES DAILY FOR 10 DAYS 05/08/21   Plotnikov, Evie Lacks, MD  aspirin 81 MG chewable tablet Chew 81 mg by mouth daily.    [provider]  B Complex Vitamins (B COMPLEX-B12 PO) Take 1 tablet by mouth daily.    [provider]  Cholecalciferol 1000 UNITS tablet Take 1,000 Units by mouth daily.    [provider]  escitalopram (LEXAPRO) 5 MG tablet TAKE 1 TABLET(5 MG) BY MOUTH DAILY 05/17/22   Plotnikov, Evie Lacks, MD  fluticasone (FLONASE) 50 MCG/ACT nasal spray Place 2 sprays into both nostrils daily. Patient not taking: Reported on 06/14/2022 12/03/19   Marrian Salvage, FNP  loratadine (CLARITIN) 10 MG tablet Take 10 mg by mouth daily.    [provider]  omeprazole (PRILOSEC) 40 MG capsule TAKE 1 CAPSULE BY MOUTH EVERY DAY 05/17/22   Plotnikov, Evie Lacks, MD  Polyethylene Glycol 3350 (MIRALAX PO) Take 234 g by mouth once. Colonoscopy prep    [provider]  triamterene-hydrochlorothiazide (MAXZIDE-25) 37.5-25 MG tablet TAKE 1/2 TABLET BY MOUTH DAILY 09/10/21   Plotnikov, Evie Lacks, MD    Current Outpatient Medications  Medication Sig Dispense Refill   acetaminophen (TYLENOL) 500 MG tablet Take 500 mg by mouth every 6 (six) hours as needed.     aspirin 81 MG chewable tablet Chew 81 mg by mouth daily.  Cholecalciferol 1000 UNITS tablet Take 1,000 Units by mouth daily.     escitalopram (LEXAPRO) 5 MG tablet TAKE 1 TABLET(5 MG) BY MOUTH DAILY 90 tablet 1   fexofenadine-pseudoephedrine (ALLEGRA-D 24) 180-240 MG 24 hr tablet Take 1 tablet by mouth daily.     omeprazole (PRILOSEC) 40 MG capsule TAKE 1 CAPSULE BY MOUTH EVERY DAY 90 capsule 1   triamterene-hydrochlorothiazide (MAXZIDE-25) 37.5-25 MG tablet TAKE 1/2 TABLET BY MOUTH DAILY 90 tablet 1   acyclovir (ZOVIRAX) 400 MG tablet TAKE 1 TABLET(400 MG) BY MOUTH THREE TIMES DAILY FOR 10 DAYS 21 tablet 3   B Complex Vitamins (B COMPLEX-B12 PO) Take 1 tablet by  mouth daily.     fluticasone (FLONASE) 50 MCG/ACT nasal spray Place 2 sprays into both nostrils daily. (Patient not taking: Reported on 06/14/2022) 16 g 6   loratadine (CLARITIN) 10 MG tablet Take 10 mg by mouth daily.     Current Facility-Administered Medications  Medication Dose Route Frequency Provider Last Rate Last Admin   0.9 %  sodium chloride infusion  500 mL Intravenous Once Gatha Mayer, MD        Allergies as of 07/14/2022 - Review Complete 07/14/2022  Allergen Reaction Noted   Poison ivy extract  01/26/2017    Family History  Problem Relation Age of Onset   Diabetes Mother    Hypertension Mother    Stroke Sister    Heart disease Brother    Hypertension Other    Alzheimer's disease Other    Colon cancer Neg Hx    Colon polyps Neg Hx    Esophageal cancer Neg Hx    Stomach cancer Neg Hx    Rectal cancer Neg Hx     Social History   Socioeconomic History   Marital status: Married    Spouse name: Not on file   Number of children: Not on file   Years of education: Not on file   Highest education level: Not on file  Occupational History   Not on file  Tobacco Use   Smoking status: Former    Packs/day: 0.50    Years: 30.00    Total pack years: 15.00    Types: Cigarettes    Quit date: 10/04/2001    Years since quitting: 20.7   Smokeless tobacco: Never  Vaping Use   Vaping Use: Never used  Substance and Sexual Activity   Alcohol use: Yes    Alcohol/week: 1.0 standard drink of alcohol    Types: 1 Glasses of wine per week    Comment: rare wine   Drug use: No   Sexual activity: Yes  Other Topics Concern   Not on file  Social History Narrative   Regular Exercise- no   Social Determinants of Health   Financial Resource Strain: Low Risk  (04/16/2022)   Overall Financial Resource Strain (CARDIA)    Difficulty of Paying Living Expenses: Not hard at all  Food Insecurity: No Food Insecurity (04/16/2022)   Hunger Vital Sign    Worried About Running Out of Food  in the Last Year: Never true    Ran Out of Food in the Last Year: Never true  Transportation Needs: No Transportation Needs (04/16/2022)   PRAPARE - Hydrologist (Medical): No    Lack of Transportation (Non-Medical): No  Physical Activity: Sufficiently Active (04/16/2022)   Exercise Vital Sign    Days of Exercise per Week: 7 days    Minutes of Exercise per Session:  40 min  Stress: No Stress Concern Present (04/16/2022)   [place]    Feeling of Stress : Not at all  Social Connections: Moderately Integrated (04/16/2022)   Social Connection and Isolation Panel [NHANES]    Frequency of Communication with Friends and Family: Three times a week    Frequency of Social Gatherings with Friends and Family: Three times a week    Attends Religious Services: More than 4 times per year    Active Member of Clubs or Organizations: No    Attends Archivist Meetings: Never    Marital Status: Married  Human resources officer Violence: Not At Risk (04/16/2022)   Humiliation, Afraid, Rape, and Kick questionnaire    Fear of Current or Ex-Partner: No    Emotionally Abused: No    Physically Abused: No    Sexually Abused: No    Review of Systems:  All other review of systems negative except as mentioned in the HPI.  Physical Exam: Vital signs BP 125/71   Pulse 69   Temp (!) 97.3 F (36.3 C) (Temporal)   Ht '5\' 5"'$  (1.651 m)   Wt 155 lb (70.3 kg)   SpO2 96%   BMI 25.79 kg/m   General:   Alert,  Well-developed, well-nourished, pleasant and cooperative in NAD Lungs:  Clear throughout to auscultation.   Heart:  Regular rate and rhythm; no murmurs, clicks, rubs,  or gallops. Abdomen:  Soft, nontender and nondistended. Normal bowel sounds.   Neuro/Psych:  Alert and cooperative. Normal mood and affect. A and O x 3   '@Shawnta Schlegel'$  Simonne Maffucci, MD, [facility] [phone] (pager) 07/14/2022 8:55  AM@

## 2022-07-14 NOTE — Op Note (Addendum)
[facility] Patient Name: Tina Bird Procedure Date: 07/14/2022 9:02 AM MRN: [id number] Endoscopist: Gatha Mayer , MD Age: 71 Referring MD:  Date of Birth: Aug 09, 1951 Gender: Female Account #: [id number] Procedure:                Colonoscopy Indications:              Screening for colorectal malignant neoplasm, Last                            colonoscopy: 2011 Medicines:                Monitored Anesthesia Care Procedure:                Pre-Anesthesia Assessment:                           - Prior to the procedure, a History and Physical                            was performed, and patient medications and                            allergies were reviewed. The patient's tolerance of                            previous anesthesia was also reviewed. The risks                            and benefits of the procedure and the sedation                            options and risks were discussed with the patient.                            All questions were answered, and informed consent                            was obtained. Prior Anticoagulants: The patient has                            taken no previous anticoagulant or antiplatelet                            agents. ASA Grade Assessment: II - A patient with                            mild systemic disease. After reviewing the risks                            and benefits, the patient was deemed in                            satisfactory condition to undergo the procedure.  After obtaining informed consent, the colonoscope                            was passed under direct vision. Throughout the                            procedure, the patient's blood pressure, pulse, and                            oxygen saturations were monitored continuously. The                            PCF-HQ190L Colonoscope was introduced through the                            anus and advanced to the the cecum,  identified by                            appendiceal orifice and ileocecal valve. The                            colonoscopy was performed without difficulty. The                            patient tolerated the procedure well. The quality                            of the bowel preparation was good. The ileocecal                            valve, appendiceal orifice, and rectum were                            photographed. The bowel preparation used was                            Miralax via split dose instruction. Scope In: 9:07:40 AM Scope Out: 9:28:32 AM Scope Withdrawal Time: 0 hours 12 minutes 16 seconds  Total Procedure Duration: 0 hours 20 minutes 52 seconds  Findings:                 The perianal and digital rectal examinations were                            normal.                           A diminutive polyp was found in the transverse                            colon. The polyp was sessile. The polyp was removed                            with a cold snare. Resection and retrieval were  complete. Verification of patient identification                            for the specimen was done. Estimated blood loss was                            minimal.                           Multiple small and large-mouthed diverticula were                            found in the sigmoid colon, descending colon and                            transverse colon.                           The exam was otherwise without abnormality on                            direct and retroflexion views. Complications:            No immediate complications. Estimated Blood Loss:     Estimated blood loss was minimal. Impression:               - One diminutive polyp in the transverse colon,                            removed with a cold snare. Resected and retrieved.                           - Diverticulosis in the sigmoid colon, in the                            descending colon and  in the transverse colon.                            Severte w/ angulation in sigmoid.                           - The examination was otherwise normal on direct                            and retroflexion views. Recommendation:           - Patient has a contact number available for                            emergencies. The signs and symptoms of potential                            delayed complications were discussed with the                            patient. Return to normal activities tomorrow.  Written discharge instructions were provided to the                            patient.                           - Resume previous diet.                           - Continue present medications.                           - Await pathology results.                           - Repeat colonoscopy is recommended. The                            colonoscopy date will be determined after pathology                            results from today's exam become available for                            review. Use pediatric scope. Gatha Mayer, MD 07/14/2022 9:38:38 AM This report has been signed electronically.

## 2022-07-14 NOTE — Progress Notes (Signed)
Pt's states no medical or surgical changes since previsit or office visit. 

## 2022-07-15 ENCOUNTER — Telehealth: Payer: Self-pay

## 2022-07-15 NOTE — Telephone Encounter (Signed)
  Follow up Call-     07/14/2022    8:44 AM  Call back number  Post procedure Call Back phone  # [phone]  Permission to leave phone message Yes     Patient questions:  Do you have a fever, pain , or abdominal swelling? No. Pain Score  0 *  Have you tolerated food without any problems? Yes.    Have you been able to return to your normal activities? Yes.    Do you have any questions about your discharge instructions: Diet   No. Medications  No. Follow up visit  No.  Do you have questions or concerns about your Care? No.  Actions: * If pain score is 4 or above: No action needed, pain <4.

## 2022-07-19 ENCOUNTER — Telehealth: Payer: Medicare Other

## 2022-07-21 ENCOUNTER — Encounter: Payer: Self-pay | Admitting: Internal Medicine

## 2022-08-17 ENCOUNTER — Other Ambulatory Visit: Payer: Self-pay | Admitting: Internal Medicine

## 2022-09-15 DIAGNOSIS — M9905 Segmental and somatic dysfunction of pelvic region: Secondary | ICD-10-CM | POA: Diagnosis not present

## 2022-09-15 DIAGNOSIS — M5136 Other intervertebral disc degeneration, lumbar region: Secondary | ICD-10-CM | POA: Diagnosis not present

## 2022-09-15 DIAGNOSIS — M5417 Radiculopathy, lumbosacral region: Secondary | ICD-10-CM | POA: Diagnosis not present

## 2022-09-15 DIAGNOSIS — M9902 Segmental and somatic dysfunction of thoracic region: Secondary | ICD-10-CM | POA: Diagnosis not present

## 2022-09-15 DIAGNOSIS — M9903 Segmental and somatic dysfunction of lumbar region: Secondary | ICD-10-CM | POA: Diagnosis not present

## 2022-09-16 ENCOUNTER — Other Ambulatory Visit: Payer: Self-pay | Admitting: Internal Medicine

## 2022-09-17 ENCOUNTER — Telehealth: Payer: Self-pay | Admitting: Internal Medicine

## 2022-09-17 MED ORDER — ACYCLOVIR 400 MG PO TABS: ORAL_TABLET | ORAL | 0 refills | Status: DC

## 2022-09-17 NOTE — Telephone Encounter (Signed)
Caller & Relationship to patient: Hanin  Call back number: [phone]  Date of last office visit: 03/08/22  Date of next office visit: 10/19/22  Medication(s) to be refilled: acyclovir (ZOVIRAX) 400 MG tablet  Patient wanted to know if it can be sent in today   Preferred Pharmacy: [facility], [place] - [address] [place], [place] [address]

## 2022-09-17 NOTE — Telephone Encounter (Signed)
Notified pt rx sent tp pof.Marland KitchenJohny Bird

## 2022-09-20 DIAGNOSIS — M9902 Segmental and somatic dysfunction of thoracic region: Secondary | ICD-10-CM | POA: Diagnosis not present

## 2022-09-20 DIAGNOSIS — M9903 Segmental and somatic dysfunction of lumbar region: Secondary | ICD-10-CM | POA: Diagnosis not present

## 2022-09-20 DIAGNOSIS — M5417 Radiculopathy, lumbosacral region: Secondary | ICD-10-CM | POA: Diagnosis not present

## 2022-09-20 DIAGNOSIS — M5136 Other intervertebral disc degeneration, lumbar region: Secondary | ICD-10-CM | POA: Diagnosis not present

## 2022-09-20 DIAGNOSIS — M9905 Segmental and somatic dysfunction of pelvic region: Secondary | ICD-10-CM | POA: Diagnosis not present

## 2022-09-22 DIAGNOSIS — M5136 Other intervertebral disc degeneration, lumbar region: Secondary | ICD-10-CM | POA: Diagnosis not present

## 2022-09-22 DIAGNOSIS — M5417 Radiculopathy, lumbosacral region: Secondary | ICD-10-CM | POA: Diagnosis not present

## 2022-09-22 DIAGNOSIS — M9903 Segmental and somatic dysfunction of lumbar region: Secondary | ICD-10-CM | POA: Diagnosis not present

## 2022-09-22 DIAGNOSIS — M9902 Segmental and somatic dysfunction of thoracic region: Secondary | ICD-10-CM | POA: Diagnosis not present

## 2022-09-22 DIAGNOSIS — M9905 Segmental and somatic dysfunction of pelvic region: Secondary | ICD-10-CM | POA: Diagnosis not present

## 2022-09-29 DIAGNOSIS — M9902 Segmental and somatic dysfunction of thoracic region: Secondary | ICD-10-CM | POA: Diagnosis not present

## 2022-09-29 DIAGNOSIS — M5417 Radiculopathy, lumbosacral region: Secondary | ICD-10-CM | POA: Diagnosis not present

## 2022-09-29 DIAGNOSIS — M9903 Segmental and somatic dysfunction of lumbar region: Secondary | ICD-10-CM | POA: Diagnosis not present

## 2022-09-29 DIAGNOSIS — M9905 Segmental and somatic dysfunction of pelvic region: Secondary | ICD-10-CM | POA: Diagnosis not present

## 2022-09-29 DIAGNOSIS — M5136 Other intervertebral disc degeneration, lumbar region: Secondary | ICD-10-CM | POA: Diagnosis not present

## 2022-10-19 ENCOUNTER — Encounter: Payer: Self-pay | Admitting: Internal Medicine

## 2022-10-19 ENCOUNTER — Ambulatory Visit (INDEPENDENT_AMBULATORY_CARE_PROVIDER_SITE_OTHER): Payer: Medicare Other | Admitting: Internal Medicine

## 2022-10-19 VITALS — BP 150/90 | HR 79 | Temp 99.5°F | Ht 65.0 in | Wt 159.8 lb

## 2022-10-19 DIAGNOSIS — E785 Hyperlipidemia, unspecified: Secondary | ICD-10-CM | POA: Diagnosis not present

## 2022-10-19 DIAGNOSIS — F419 Anxiety disorder, unspecified: Secondary | ICD-10-CM | POA: Diagnosis not present

## 2022-10-19 DIAGNOSIS — I10 Essential (primary) hypertension: Secondary | ICD-10-CM | POA: Diagnosis not present

## 2022-10-19 DIAGNOSIS — Z0000 Encounter for general adult medical examination without abnormal findings: Secondary | ICD-10-CM

## 2022-10-19 DIAGNOSIS — Z23 Encounter for immunization: Secondary | ICD-10-CM

## 2022-10-19 LAB — LIPID PANEL
Cholesterol: 228 mg/dL — ABNORMAL HIGH (ref 0–200)
HDL: 39 mg/dL — ABNORMAL LOW (ref >39.00)
NonHDL: 188.53
Total CHOL/HDL Ratio: 6
Triglycerides: 273 mg/dL — ABNORMAL HIGH (ref 0.0–149.0)
VLDL: 54.6 mg/dL — ABNORMAL HIGH (ref 0.0–40.0)

## 2022-10-19 LAB — URINALYSIS, ROUTINE W REFLEX MICROSCOPIC
Bilirubin Urine: NEGATIVE
Hgb urine dipstick: NEGATIVE
Ketones, ur: NEGATIVE
Nitrite: NEGATIVE
RBC / HPF: NONE SEEN (ref 0–?)
Specific Gravity, Urine: 1.01 (ref 1.000–1.030)
Total Protein, Urine: NEGATIVE
Urine Glucose: NEGATIVE
Urobilinogen, UA: 0.2 (ref 0.0–1.0)
pH: 7 (ref 5.0–8.0)

## 2022-10-19 LAB — COMPREHENSIVE METABOLIC PANEL
ALT: 30 U/L (ref 0–35)
AST: 28 U/L (ref 0–37)
Albumin: 4.5 g/dL (ref 3.5–5.2)
Alkaline Phosphatase: 65 U/L (ref 39–117)
BUN: 17 mg/dL (ref 6–23)
CO2: 32 mEq/L (ref 19–32)
Calcium: 9.6 mg/dL (ref 8.4–10.5)
Chloride: 98 mEq/L (ref 96–112)
Creatinine, Ser: 0.89 mg/dL (ref 0.40–1.20)
GFR: 64.46 mL/min (ref >60.00)
Glucose, Bld: 97 mg/dL (ref 70–99)
Potassium: 4.5 mEq/L (ref 3.5–5.1)
Sodium: 137 mEq/L (ref 135–145)
Total Bilirubin: 0.3 mg/dL (ref 0.2–1.2)
Total Protein: 7.3 g/dL (ref 6.0–8.3)

## 2022-10-19 LAB — CBC WITH DIFFERENTIAL/PLATELET
Basophils Absolute: 0.1 10*3/uL (ref 0.0–0.1)
Basophils Relative: 0.9 % (ref 0.0–3.0)
Eosinophils Absolute: 0.2 10*3/uL (ref 0.0–0.7)
Eosinophils Relative: 3.6 % (ref 0.0–5.0)
HCT: 39.7 % (ref 36.0–46.0)
Hemoglobin: 13.8 g/dL (ref 12.0–15.0)
Lymphocytes Relative: 28.4 % (ref 12.0–46.0)
Lymphs Abs: 1.7 10*3/uL (ref 0.7–4.0)
MCHC: 34.7 g/dL (ref 30.0–36.0)
MCV: 85 fl (ref 78.0–100.0)
Monocytes Absolute: 0.3 10*3/uL (ref 0.1–1.0)
Monocytes Relative: 5.9 % (ref 3.0–12.0)
Neutro Abs: 3.6 10*3/uL (ref 1.4–7.7)
Neutrophils Relative %: 61.2 % (ref 43.0–77.0)
Platelets: 254 10*3/uL (ref 150.0–400.0)
RBC: 4.67 Mil/uL (ref 3.87–5.11)
RDW: 13.5 % (ref 11.5–15.5)
WBC: 5.9 10*3/uL (ref 4.0–10.5)

## 2022-10-19 LAB — LDL CHOLESTEROL, DIRECT: Direct LDL: 150 mg/dL

## 2022-10-19 LAB — TSH: TSH: 2.2 u[IU]/mL (ref 0.35–5.50)

## 2022-10-19 MED ORDER — ESCITALOPRAM OXALATE 5 MG PO TABS: 5.0000 mg | ORAL_TABLET | Freq: Every day | ORAL | 1 refills | Status: DC

## 2022-10-19 MED ORDER — OMEPRAZOLE 40 MG PO CPDR: 40.0000 mg | DELAYED_RELEASE_CAPSULE | Freq: Every day | ORAL | 3 refills | Status: DC

## 2022-10-19 MED ORDER — TRIAMTERENE-HCTZ 37.5-25 MG PO TABS: 1.0000 | ORAL_TABLET | Freq: Every day | ORAL | 3 refills | Status: DC

## 2022-10-19 MED ORDER — TRIAMTERENE-HCTZ 37.5-25 MG PO TABS: ORAL_TABLET | ORAL | 3 refills | Status: DC

## 2022-10-19 NOTE — Assessment & Plan Note (Addendum)
We discussed age appropriate health related issues, including available/recomended screening tests and vaccinations. Labs were ordered to be later reviewed . All questions were answered. We discussed one or more of the following - seat belt use, use of sunscreen/sun exposure exercise, fall risk reduction, second hand smoke exposure, firearm use and storage, seat belt use, a need for adhering to healthy diet and exercise. Labs were ordered.  All questions were answered. Colon 2011, 2023 - Dr Carlean Purl, due next  mammo q 1 year PAP - advised to schedule Singrix Rx Mammo is due Coronary calcium CT score of 0. This was 0 percentile for age and sex matched control.

## 2022-10-19 NOTE — Patient Instructions (Signed)
Magnesium oil spray for cramps

## 2022-10-19 NOTE — Addendum Note (Signed)
Addended by: Vertell Novak on: 10/19/2022 10:49 AM   Modules accepted: Orders

## 2022-10-19 NOTE — Assessment & Plan Note (Addendum)
Increase Maxzide to 1 tab daily Magnesium oil spray for cramps

## 2022-10-19 NOTE — Progress Notes (Signed)
Subjective:  Patient ID: Tina Bird, female    DOB: [birth year]  Age: 72 y.o. MRN: [id number]  CC: Annual Exam (Physical. And refills)   HPI Tina Bird presents for well exam  Outpatient Medications Prior to Visit  Medication Sig Dispense Refill   acetaminophen (TYLENOL) 500 MG tablet Take 500 mg by mouth every 6 (six) hours as needed.     acyclovir (ZOVIRAX) 400 MG tablet TAKE 1 TABLET(400 MG) BY MOUTH THREE TIMES DAILY FOR 10 DAYS. Appt is DUE 21 tablet 0   aspirin 81 MG chewable tablet Chew 81 mg by mouth daily.     B Complex Vitamins (B COMPLEX-B12 PO) Take 1 tablet by mouth daily.     COMIRNATY SUSP injection      fexofenadine-pseudoephedrine (ALLEGRA-D 24) 180-240 MG 24 hr tablet Take 1 tablet by mouth daily.     FLUAD QUADRIVALENT 0.5 ML injection      loratadine (CLARITIN) 10 MG tablet Take 10 mg by mouth daily.     escitalopram (LEXAPRO) 5 MG tablet TAKE 1 TABLET(5 MG) BY MOUTH DAILY 90 tablet 1   omeprazole (PRILOSEC) 40 MG capsule TAKE 1 CAPSULE BY MOUTH EVERY DAY 90 capsule 1   triamterene-hydrochlorothiazide (MAXZIDE-25) 37.5-25 MG tablet TAKE 1/2 TABLET BY MOUTH EVERY DAY. FOLLOW-UP DUE IN DECEMBER FOR REFILLS 45 tablet 3   Cholecalciferol 1000 UNITS tablet Take 1,000 Units by mouth daily. (Patient not taking: Reported on 10/19/2022)     fluticasone (FLONASE) 50 MCG/ACT nasal spray Place 2 sprays into both nostrils daily. (Patient not taking: Reported on 06/14/2022) 16 g 6   No facility-administered medications prior to visit.    ROS: Review of Systems  Constitutional:  Negative for activity change, appetite change, chills, fatigue and unexpected weight change.  HENT:  Negative for congestion, mouth sores and sinus pressure.   Eyes:  Negative for visual disturbance.  Respiratory:  Negative for cough and chest tightness.   Gastrointestinal:  Negative for abdominal pain and nausea.  Genitourinary:  Negative for difficulty urinating, frequency and  vaginal pain.  Musculoskeletal:  Positive for back pain and myalgias. Negative for gait problem.  Skin:  Negative for pallor and rash.  Neurological:  Negative for dizziness, tremors, weakness, numbness and headaches.  Psychiatric/Behavioral:  Negative for confusion and sleep disturbance.     Objective:  BP (!) 150/90 (BP Location: Right Arm, Patient Position: Sitting, Cuff Size: Large)   Pulse 79   Temp 99.5 F (37.5 C) (Oral)   Ht '5\' 5"'$  (1.651 m)   Wt 159 lb 12.8 oz (72.5 kg)   SpO2 95%   BMI 26.59 kg/m   BP Readings from Last 3 Encounters:  10/19/22 (!) 150/90  07/14/22 (!) 143/59  03/08/22 130/90    Wt Readings from Last 3 Encounters:  10/19/22 159 lb 12.8 oz (72.5 kg)  07/14/22 155 lb (70.3 kg)  06/14/22 155 lb (70.3 kg)    Physical Exam Constitutional:      General: She is not in acute distress.    Appearance: Normal appearance. She is well-developed.  HENT:     Head: Normocephalic.     Right Ear: External ear normal.     Left Ear: External ear normal.     Nose: Nose normal.  Eyes:     General:        Right eye: No discharge.        Left eye: No discharge.     Conjunctiva/sclera: Conjunctivae normal.  Pupils: Pupils are equal, round, and reactive to light.  Neck:     Thyroid: No thyromegaly.     Vascular: No JVD.     Trachea: No tracheal deviation.  Cardiovascular:     Rate and Rhythm: Normal rate and regular rhythm.     Heart sounds: Normal heart sounds.  Pulmonary:     Effort: No respiratory distress.     Breath sounds: No stridor. No wheezing.  Abdominal:     General: Bowel sounds are normal. There is no distension.     Palpations: Abdomen is soft. There is no mass.     Tenderness: There is no abdominal tenderness. There is no guarding or rebound.  Musculoskeletal:        General: No tenderness.     Cervical back: Normal range of motion and neck supple. No rigidity.  Lymphadenopathy:     Cervical: No cervical adenopathy.  Skin:    Findings:  No erythema or rash.  Neurological:     Cranial Nerves: No cranial nerve deficit.     Motor: No abnormal muscle tone.     Coordination: Coordination normal.     Deep Tendon Reflexes: Reflexes normal.  Psychiatric:        Behavior: Behavior normal.        Thought Content: Thought content normal.        Judgment: Judgment normal.     Lab Results  Component Value Date   WBC 4.7 01/02/2021   HGB 13.2 01/02/2021   HCT 38.6 01/02/2021   PLT 226.0 01/02/2021   GLUCOSE 64 (L) 08/20/2021   CHOL 222 (H) 05/01/2020   TRIG 291 (H) 05/01/2020   HDL 38 (L) 05/01/2020   LDLDIRECT 119.0 05/07/2019   LDLCALC 139 (H) 05/01/2020   ALT 24 08/20/2021   AST 25 08/20/2021   NA 137 08/20/2021   K 3.9 08/20/2021   CL 98 08/20/2021   CREATININE 0.90 08/20/2021   BUN 21 08/20/2021   CO2 33 (H) 08/20/2021   TSH 2.08 01/02/2021   HGBA1C 6.0 08/20/2021    MM 3D SCREEN BREAST BILATERAL  Result Date: 08/05/2021 CLINICAL DATA:  Screening. EXAM: DIGITAL SCREENING BILATERAL MAMMOGRAM WITH TOMOSYNTHESIS AND CAD TECHNIQUE: Bilateral screening digital craniocaudal and mediolateral oblique mammograms were obtained. Bilateral screening digital breast tomosynthesis was performed. The images were evaluated with computer-aided detection. COMPARISON:  Previous exam(s). ACR Breast Density Category b: There are scattered areas of fibroglandular density. FINDINGS: There are no findings suspicious for malignancy. IMPRESSION: No mammographic evidence of malignancy. A result letter of this screening mammogram will be mailed directly to the patient. RECOMMENDATION: Screening mammogram in one year. (Code:SM-B-01Y) BI-RADS CATEGORY  1: Negative. Electronically Signed   By: Fidela Salisbury M.D.   On: 08/05/2021 16:31    Assessment & Plan:   Problem List Items Addressed This Visit       Cardiovascular and Mediastinum   Essential hypertension, benign    Increase Maxzide to 1 tab daily Magnesium oil spray for cramps       Relevant Medications   triamterene-hydrochlorothiazide (MAXZIDE-25) 37.5-25 MG tablet     Other   Well adult exam - Primary    We discussed age appropriate health related issues, including available/recomended screening tests and vaccinations. Labs were ordered to be later reviewed . All questions were answered. We discussed one or more of the following - seat belt use, use of sunscreen/sun exposure exercise, fall risk reduction, second hand smoke exposure, firearm use and storage, seat  belt use, a need for adhering to healthy diet and exercise. Labs were ordered.  All questions were answered. Colon 2011, 2023 - Dr Carlean Purl, due next  mammo q 1 year PAP - advised to schedule Singrix Rx Mammo is due Coronary calcium CT score of 0. This was 0 percentile for age and sex matched control.         Meds ordered this encounter  Medications   escitalopram (LEXAPRO) 5 MG tablet    Sig: Take 1 tablet (5 mg total) by mouth daily.    Dispense:  90 tablet    Refill:  1   omeprazole (PRILOSEC) 40 MG capsule    Sig: Take 1 capsule (40 mg total) by mouth daily.    Dispense:  90 capsule    Refill:  3   DISCONTD: triamterene-hydrochlorothiazide (MAXZIDE-25) 37.5-25 MG tablet    Sig: TAKE 1/2 TABLET BY MOUTH EVERY DAY    Dispense:  45 tablet    Refill:  3   triamterene-hydrochlorothiazide (MAXZIDE-25) 37.5-25 MG tablet    Sig: Take 1 tablet by mouth daily.    Dispense:  90 tablet    Refill:  3      Follow-up: Return in about 6 months (around 04/19/2023) for a follow-up visit.  Walker Kehr, MD

## 2023-01-06 ENCOUNTER — Ambulatory Visit (INDEPENDENT_AMBULATORY_CARE_PROVIDER_SITE_OTHER): Payer: Medicare Other | Admitting: Family Medicine

## 2023-01-06 ENCOUNTER — Encounter: Payer: Self-pay | Admitting: Family Medicine

## 2023-01-06 VITALS — BP 114/76 | HR 71 | Temp 97.6°F | Ht 65.0 in | Wt 158.0 lb

## 2023-01-06 DIAGNOSIS — B349 Viral infection, unspecified: Secondary | ICD-10-CM | POA: Diagnosis not present

## 2023-01-06 DIAGNOSIS — R051 Acute cough: Secondary | ICD-10-CM

## 2023-01-06 DIAGNOSIS — R067 Sneezing: Secondary | ICD-10-CM

## 2023-01-06 DIAGNOSIS — R432 Parageusia: Secondary | ICD-10-CM

## 2023-01-06 DIAGNOSIS — R430 Anosmia: Secondary | ICD-10-CM

## 2023-01-06 NOTE — Progress Notes (Signed)
Subjective:  Tina Bird is a 72 y.o. female who presents for a 10 day hx of body aches, fatigue, productive cough, sneezing, headaches, sinus pain.  No taste or smell   No fever, chills, chest pain, shortness of breath, vomiting.   Negative Covid tests.   States she took Amoxicillin   Taking ibuprofen, Tylenol, Allegra    No other aggravating or relieving factors.  No other c/o.  ROS as in subjective.   Objective: Vitals:   01/06/23 1120  BP: 114/76  Pulse: 71  Temp: 97.6 F (36.4 C)  SpO2: 98%    General appearance: Alert, WD/WN, no distress, mildly ill appearing                             Skin: warm, no rash                           Head: no sinus tenderness                            Eyes: conjunctiva normal, corneas clear, PERRLA                            Ears: pearly TMs, external ear canals normal                          Nose: septum midline, turbinates swollen, with erythema and clear discharge             Mouth/throat: MMM, tongue normal, mild pharyngeal erythema                           Neck: supple, no adenopathy, no thyromegaly, nontender                          Heart: RRR                         Lungs: CTA bilaterally, no wheezes, rales, or rhonchi      Assessment: Acute viral syndrome  Loss of taste  Loss of smell  Sneezing  Acute cough   Plan: Discussed diagnosis and treatment of viral URI. Suspect she had Covid since she has loss of taste and smell. May take some time to return to normal.  Suggested symptomatic OTC remedies. Follow up if worsening or not improving in the next 5-7 days.

## 2023-01-06 NOTE — Patient Instructions (Signed)
Continue treating your symptoms and staying hydrated.   Follow up if not improving or worsening over the next 5-7 days.

## 2023-01-26 ENCOUNTER — Ambulatory Visit: Payer: Medicare Other | Admitting: Internal Medicine

## 2023-04-18 ENCOUNTER — Ambulatory Visit (INDEPENDENT_AMBULATORY_CARE_PROVIDER_SITE_OTHER): Payer: Medicare Other

## 2023-04-18 VITALS — Ht 65.0 in | Wt 155.0 lb

## 2023-04-18 DIAGNOSIS — Z0000 Encounter for general adult medical examination without abnormal findings: Secondary | ICD-10-CM

## 2023-04-18 NOTE — Progress Notes (Addendum)
Subjective:   Tina Bird is a 72 y.o. female who presents for Medicare Annual (Subsequent) preventive examination.  Visit Complete: Virtual  I connected with  [name] on 04/18/23 by a audio enabled telemedicine application and verified that I am speaking with the correct person using two identifiers.  Patient Location: Home  Provider Location: Office/Clinic  I discussed the limitations of evaluation and management by telemedicine. The patient expressed understanding and agreed to proceed.   Review of Systems     Cardiac Risk Factors include: advanced age (>69men, >15 women);dyslipidemia;hypertension;family history of premature cardiovascular disease     Objective:    Today's Vitals   04/18/23 0917  Weight: 155 lb (70.3 kg)  Height: 5\' 5"  (1.651 m)  PainSc: 0-No pain   Body mass index is 25.79 kg/m.     04/18/2023    9:19 AM 04/16/2022    8:14 AM 02/19/2021   11:31 AM 11/23/2018    9:45 AM 08/09/2018    1:04 PM 04/14/2017    9:41 AM 01/25/2016    1:22 PM  Advanced Directives  Does Patient Have a Medical Advance Directive? Yes Yes No No No No No  Type of Estate agent of [place];Living will Healthcare Power of [place];Living will       Copy of Healthcare Power of Attorney in Chart? No - copy requested No - copy requested       Would patient like information on creating a medical advance directive?   No - Patient declined  Yes (ED - Information included in AVS) No - Patient declined     Current Medications (verified) Outpatient Encounter Medications as of 04/18/2023  Medication Sig   acetaminophen (TYLENOL) 500 MG tablet Take 500 mg by mouth every 6 (six) hours as needed.   aspirin 81 MG chewable tablet Chew 81 mg by mouth daily.   B Complex Vitamins (B COMPLEX-B12 PO) Take 1 tablet by mouth daily.   Cholecalciferol 1000 UNITS tablet Take 1,000 Units by mouth daily. (Patient not taking: Reported on 10/19/2022)   escitalopram  (LEXAPRO) 5 MG tablet Take 1 tablet (5 mg total) by mouth daily.   fexofenadine-pseudoephedrine (ALLEGRA-D 24) 180-240 MG 24 hr tablet Take 1 tablet by mouth daily.   fluticasone (FLONASE) 50 MCG/ACT nasal spray Place 2 sprays into both nostrils daily. (Patient not taking: Reported on 06/14/2022)   loratadine (CLARITIN) 10 MG tablet Take 10 mg by mouth daily.   omeprazole (PRILOSEC) 40 MG capsule Take 1 capsule (40 mg total) by mouth daily.   triamterene-hydrochlorothiazide (MAXZIDE-25) 37.5-25 MG tablet Take 1 tablet by mouth daily.   No facility-administered encounter medications on file as of 04/18/2023.    Allergies (verified) Poison ivy extract   History: Past Medical History:  Diagnosis Date   Asthmatic bronchitis 09/08/2012   03-07-12 bronchits x1   Cataract    Essential hypertension, benign 05/25/2016   GERD (gastroesophageal reflux disease)    LBP (low back pain)    Melanoma (HCC)    Right leg and Right breast   Menopausal symptoms    Dr. Elana Alm   Osteoarthritis 07/27/2011   left knee   Vertigo    Past Surgical History:  Procedure Laterality Date   BREAST EXCISIONAL BIOPSY Right    skin CA   BROW LIFT Bilateral 11/30/2018   Procedure: BLEPHAROPLASTY;  Surgeon: Peggye Form, DO;  Location: [facility];  Service: Plastics;  Laterality: Bilateral;  90 min surgery time - per  provider   CHOLECYSTECTOMY     laparoscopic   COLONOSCOPY  2011   DILATION AND CURETTAGE OF UTERUS     LAPAROSCOPIC APPENDECTOMY N/A 01/25/2016   Procedure: APPENDECTOMY LAPAROSCOPIC;  Surgeon: Almond Lint, MD;  Location: [facility];  Service: General;  Laterality: N/A;   PEXY  09/11/2012   Procedure: PEXY;  Surgeon: Ardeth Sportsman, MD;  Location: [facility];  Service: General;  Laterality: N/A;   TONSILLECTOMY     TRANSANAL HEMORRHOIDAL DEARTERIALIZATION  09/11/2012   Procedure: TRANSANAL HEMORRHOIDAL DEARTERIALIZATION;  Surgeon: Ardeth Sportsman, MD;  Location: [facility];  Service:  General;  Laterality: N/A;  THD hemorrhoidal ligation/pexy   TUBAL LIGATION     Family History  Problem Relation Age of Onset   Diabetes Mother    Hypertension Mother    Stroke Sister    Heart disease Brother    Hypertension Other    Alzheimer's disease Other    Colon cancer Neg Hx    Colon polyps Neg Hx    Esophageal cancer Neg Hx    Stomach cancer Neg Hx    Rectal cancer Neg Hx    Social History   Socioeconomic History   Marital status: Married    Spouse name: Not on file   Number of children: Not on file   Years of education: Not on file   Highest education level: Not on file  Occupational History   Not on file  Tobacco Use   Smoking status: Former    Current packs/day: 0.00    Average packs/day: 0.5 packs/day for 30.0 years (15.0 ttl pk-yrs)    Types: Cigarettes    Start date: 10/05/1971    Quit date: 10/04/2001    Years since quitting: 21.5   Smokeless tobacco: Never  Vaping Use   Vaping status: Never Used  Substance and Sexual Activity   Alcohol use: Yes    Alcohol/week: 1.0 standard drink of alcohol    Types: 1 Glasses of wine per week    Comment: rare wine   Drug use: No   Sexual activity: Yes  Other Topics Concern   Not on file  Social History Narrative   Regular Exercise- no   Social Determinants of Health   Financial Resource Strain: Low Risk  (04/18/2023)   Overall Financial Resource Strain (CARDIA)    Difficulty of Paying Living Expenses: Not hard at all  Food Insecurity: No Food Insecurity (04/18/2023)   Hunger Vital Sign    Worried About Running Out of Food in the Last Year: Never true    Ran Out of Food in the Last Year: Never true  Transportation Needs: No Transportation Needs (04/18/2023)   PRAPARE - Administrator, Civil Service (Medical): No    Lack of Transportation (Non-Medical): No  Physical Activity: Sufficiently Active (04/18/2023)   Exercise Vital Sign    Days of Exercise per Week: 5 days    Minutes of Exercise per  Session: 30 min  Stress: No Stress Concern Present (04/18/2023)   [employer] of Occupational Health - Occupational Stress Questionnaire    Feeling of Stress : Not at all  Social Connections: Moderately Integrated (04/18/2023)   Social Connection and Isolation Panel [NHANES]    Frequency of Communication with Friends and Family: Three times a week    Frequency of Social Gatherings with Friends and Family: Three times a week    Attends Religious Services: More than 4 times per year    Active Member of  Clubs or Organizations: No    Attends Banker Meetings: Never    Marital Status: Married    Tobacco Counseling Counseling given: Not Answered   Clinical Intake:  Pre-visit preparation completed: Yes  Pain : No/denies pain Pain Score: 0-No pain     BMI - recorded: 25.79 Nutritional Status: BMI 25 -29 Overweight Nutritional Risks: None Diabetes: No  How often do you need to have someone help you when you read instructions, pamphlets, or other written materials from your doctor or pharmacy?: 1 - Never What is the last grade level you completed in school?: HSG  Interpreter Needed?: No  Information entered by :: Oda Lansdowne N. Kekoa Fyock, LPN.   Activities of Daily Living    04/18/2023    9:22 AM  In your present state of health, do you have any difficulty performing the following activities:  Hearing? 0  Vision? 0  Difficulty concentrating or making decisions? 0  Walking or climbing stairs? 0  Dressing or bathing? 0  Doing errands, shopping? 0  Preparing Food and eating ? N  Using the Toilet? N  In the past six months, have you accidently leaked urine? N  Do you have problems with loss of bowel control? N  Managing your Medications? N  Managing your Finances? N  Housekeeping or managing your Housekeeping? N    Patient Care Team: Plotnikov, Georgina Quint, MD as PCP - General Leone Payor Maryjean Morn, MD as Consulting Physician (Gastroenterology) Szabat, Vinnie Level,  [facility] (Inactive) (Pharmacist) Szabat, Vinnie Level, [facility] (Inactive) as Pharmacist (Pharmacist) Sallye Lat, MD as Consulting Physician (Ophthalmology)  Indicate any recent Medical Services you may have received from other than [facility] providers in the past year (date may be approximate).     Assessment:   This is a routine wellness examination for Saydie.  Hearing/Vision screen Hearing Screening - Comments:: Denies hearing difficulties   Vision Screening - Comments:: Wears rx glasses - up to date with routine eye exams with Sallye Lat, MD.   Dietary issues and exercise activities discussed:     Goals Addressed             This Visit's Progress    My healthcare goal for 2024 is to lose a few pounds and stay healthy.        Depression Screen    04/18/2023    9:22 AM 10/19/2022   10:14 AM 04/16/2022    8:15 AM 04/16/2022    8:13 AM 02/19/2022   10:19 AM 05/08/2021   11:14 AM 02/20/2021   11:06 AM  PHQ 2/9 Scores  PHQ - 2 Score 0 0 0 0 0 0 1  PHQ- 9 Score 0     0     Fall Risk    04/18/2023    9:21 AM 10/19/2022   10:14 AM 04/16/2022    8:15 AM 02/19/2022   10:19 AM 05/08/2021   11:14 AM  Fall Risk   Falls in the past year? 0 0 0 0 0  Number falls in past yr: 0 0 0 0 0  Injury with Fall? 0 0 0 0 0  Risk for fall due to : No Fall Risks No Fall Risks  No Fall Risks No Fall Risks  Follow up Falls prevention discussed Falls evaluation completed Falls evaluation completed;Education provided Falls evaluation completed     MEDICARE RISK AT HOME:  Medicare Risk at Home - 04/18/23 0920     Any stairs in or around the home? No  If so, are there any without handrails? No    Home free of loose throw rugs in walkways, pet beds, electrical cords, etc? Yes    Adequate lighting in your home to reduce risk of falls? Yes    Life alert? No    Use of a cane, walker or w/c? No    Grab bars in the bathroom? Yes    Shower chair or bench in shower? Yes    Elevated toilet seat or a  handicapped toilet? No             TIMED UP AND GO:  Was the test performed?  No    Cognitive Function:        04/18/2023    9:23 AM  6CIT Screen  What Year? 0 points  What month? 0 points  What time? 0 points  Count back from 20 0 points  Months in reverse 0 points  Repeat phrase 0 points  Total Score 0 points    Immunizations Immunization History  Administered Date(s) Administered   COVID-19, mRNA, vaccine(Comirnaty)12 years and older 07/01/2022   Fluad Quad(high Dose 65+) 07/13/2022   Influenza Split 07/27/2011, 08/29/2012   Influenza Whole 06/02/2010   Influenza, High Dose Seasonal PF 07/15/2016, 07/11/2017   Influenza,inj,Quad PF,6+ Mos 09/12/2013, 07/26/2014, 07/02/2015, 06/25/2018, 06/19/2019, 06/16/2021   Influenza-Unspecified 06/21/2020   Moderna SARS-COV2 Booster Vaccination 02/15/2021, 07/30/2022   Moderna Sars-Covid-2 Vaccination 11/19/2019, 12/18/2019, 07/27/2020, 02/15/2021, 06/30/2021   PNEUMOCOCCAL CONJUGATE-20 10/19/2022   Pneumococcal Conjugate-13 08/09/2018   Pneumococcal Polysaccharide-23 05/07/2016   Td 06/04/2009   Tdap 05/01/2020   Zoster Recombinant(Shingrix) 05/05/2022   Zoster, Live 08/11/2011   Zoster, Unspecified 07/13/2022    TDAP status: Up to date  Flu Vaccine status: Up to date  Pneumococcal vaccine status: Up to date  Covid-19 vaccine status: Completed vaccines  Qualifies for Shingles Vaccine? Yes   Zostavax completed Yes   Shingrix Completed?: Yes  Screening Tests Health Maintenance  Topic Date Due   MAMMOGRAM  08/05/2022   COVID-19 Vaccine (8 - 2023-24 season) 10/31/2022   INFLUENZA VACCINE  05/05/2023   Medicare Annual Wellness (AWV)  04/17/2024   DTaP/Tdap/Td (3 - Td or Tdap) 05/01/2030   Pneumonia Vaccine 71+ Years old  Completed   DEXA SCAN  Completed   Hepatitis C Screening  Completed   Zoster Vaccines- Shingrix  Completed   HPV VACCINES  Aged Out   Colonoscopy  Discontinued    Health  Maintenance  Health Maintenance Due  Topic Date Due   MAMMOGRAM  08/05/2022   COVID-19 Vaccine (8 - 2023-24 season) 10/31/2022    Colorectal cancer screening: No longer required.   Mammogram status: Completed 08/05/2021. Repeat every year (Postponed by PCP)  Bone Density status: Completed 10/24/2018. Results reflect: Bone density results: OSTEOPENIA. Repeat every 2-3 years.  Lung Cancer Screening: (Low Dose CT Chest recommended if Age 65-80 years, 20 pack-year currently smoking OR have quit w/in 15years.) does not qualify.   Lung Cancer Screening Referral: no  Additional Screening:  Hepatitis C Screening: does qualify; Completed 05/11/2016  Vision Screening: Recommended annual ophthalmology exams for early detection of glaucoma and other disorders of the eye. Is the patient up to date with their annual eye exam?  Yes  Who is the provider or what is the name of the office in which the patient attends annual eye exams? Sallye Lat, MD. If pt is not established with a provider, would they like to be referred to a provider to establish care?  No .   Dental Screening: Recommended annual dental exams for proper oral hygiene  Diabetic Foot Exam: N/A  Community Resource Referral / Chronic Care Management: CRR required this visit?  No   CCM required this visit?  No     Plan:     I have personally reviewed and noted the following in the patient's chart:   Medical and social history Use of alcohol, tobacco or illicit drugs  Current medications and supplements including opioid prescriptions. Patient is not currently taking opioid prescriptions. Functional ability and status Nutritional status Physical activity Advanced directives List of other physicians Hospitalizations, surgeries, and ER visits in previous 12 months Vitals Screenings to include cognitive, depression, and falls Referrals and appointments  In addition, I have reviewed and discussed with patient certain  preventive protocols, quality metrics, and best practice recommendations. A written personalized care plan for preventive services as well as general preventive health recommendations were provided to patient.     Mickeal Needy, LPN   [id number]/40/9811   After Visit Summary: (Mail) Due to this being a telephonic visit, the after visit summary with patients personalized plan was offered to patient via mail   Nurse Notes: Normal cognitive status assessed by direct observation via telephone conversation by this Nurse Health Advisor. No abnormalities found.    Medical screening examination/treatment/procedure(s) were performed by non-physician practitioner and as supervising physician I was immediately available for consultation/collaboration.  I agree with above. Jacinta Shoe, MD

## 2023-04-18 NOTE — Patient Instructions (Addendum)
[name] , Thank you for taking time to come for your Medicare Wellness Visit. I appreciate your ongoing commitment to your health goals. Please review the following plan we discussed and let me know if I can assist you in the future.   These are the goals we discussed:  Goals      My healthcare goal for 2024 is to lose a few pounds and stay healthy.        This is a list of the screening recommended for you and due dates:  Health Maintenance  Topic Date Due   Mammogram  08/05/2022   COVID-19 Vaccine (8 - 2023-24 season) 10/31/2022   Flu Shot  05/05/2023   Medicare Annual Wellness Visit  04/17/2024   DTaP/Tdap/Td vaccine (3 - Td or Tdap) 05/01/2030   Pneumonia Vaccine  Completed   DEXA scan (bone density measurement)  Completed   Hepatitis C Screening  Completed   Zoster (Shingles) Vaccine  Completed   HPV Vaccine  Aged Out   Colon Cancer Screening  Discontinued    Advanced directives: Yes; needs updating per patient.  Conditions/risks identified: Yes  Next appointment: It was nice speaking with you today!  Please follow up in one year for your annual wellness visit via telephone call with Nurse Percell Miller on 04/18/2024 at 9:15 a.m.  If you need to cancel or reschedule please call [phone].  Preventive Care 22 Years and Older, Female Preventive care refers to lifestyle choices and visits with your health care provider that can promote health and wellness. What does preventive care include? A yearly physical exam. This is also called an annual well check. Dental exams once or twice a year. Routine eye exams. Ask your health care provider how often you should have your eyes checked. Personal lifestyle choices, including: Daily care of your teeth and gums. Regular physical activity. Eating a healthy diet. Avoiding tobacco and drug use. Limiting alcohol use. Practicing safe sex. Taking low-dose aspirin every day. Taking vitamin and mineral supplements as  recommended by your health care provider. What happens during an annual well check? The services and screenings done by your health care provider during your annual well check will depend on your age, overall health, lifestyle risk factors, and family history of disease. Counseling  Your health care provider may ask you questions about your: Alcohol use. Tobacco use. Drug use. Emotional well-being. Home and relationship well-being. Sexual activity. Eating habits. History of falls. Memory and ability to understand (cognition). Work and work Astronomer. Reproductive health. Screening  You may have the following tests or measurements: Height, weight, and BMI. Bird pressure. Lipid and cholesterol levels. These may be checked every 5 years, or more frequently if you are over 9 years old. Skin check. Lung cancer screening. You may have this screening every year starting at age 22 if you have a 30-pack-year history of smoking and currently smoke or have quit within the past 15 years. Fecal occult Bird test (FOBT) of the stool. You may have this test every year starting at age 60. Flexible sigmoidoscopy or colonoscopy. You may have a sigmoidoscopy every 5 years or a colonoscopy every 10 years starting at age 74. Hepatitis C Bird test. Hepatitis B Bird test. Sexually transmitted disease (STD) testing. Diabetes screening. This is done by checking your Bird sugar (glucose) after you have not eaten for a while (fasting). You may have this done every 1-3 years. Bone density scan. This is done to screen for osteoporosis. You may have  this done starting at age 28. Mammogram. This may be done every 1-2 years. Talk to your health care provider about how often you should have regular mammograms. Talk with your health care provider about your test results, treatment options, and if necessary, the need for more tests. Vaccines  Your health care provider may recommend certain vaccines, such  as: Influenza vaccine. This is recommended every year. Tetanus, diphtheria, and acellular pertussis (Tdap, Td) vaccine. You may need a Td booster every 10 years. Zoster vaccine. You may need this after age 3. Pneumococcal 13-valent conjugate (PCV13) vaccine. One dose is recommended after age 104. Pneumococcal polysaccharide (PPSV23) vaccine. One dose is recommended after age 15. Talk to your health care provider about which screenings and vaccines you need and how often you need them. This information is not intended to replace advice given to you by your health care provider. Make sure you discuss any questions you have with your health care provider. Document Released: 10/17/2015 Document Revised: 06/09/2016 Document Reviewed: 07/22/2015 Elsevier Interactive Patient Education  2017 [employer].  Fall Prevention in the Home Falls can cause injuries. They can happen to people of all ages. There are many things you can do to make your home safe and to help prevent falls. What can I do on the outside of my home? Regularly fix the edges of walkways and driveways and fix any cracks. Remove anything that might make you trip as you walk through a door, such as a raised step or threshold. Trim any bushes or trees on the path to your home. Use bright outdoor lighting. Clear any walking paths of anything that might make someone trip, such as rocks or tools. Regularly check to see if handrails are loose or broken. Make sure that both sides of any steps have handrails. Any raised decks and porches should have guardrails on the edges. Have any leaves, snow, or ice cleared regularly. Use sand or salt on walking paths during winter. Clean up any spills in your garage right away. This includes oil or grease spills. What can I do in the bathroom? Use night lights. Install grab bars by the toilet and in the tub and shower. Do not use towel bars as grab bars. Use non-skid mats or decals in the tub or  shower. If you need to sit down in the shower, use a plastic, non-slip stool. Keep the floor dry. Clean up any water that spills on the floor as soon as it happens. Remove soap buildup in the tub or shower regularly. Attach bath mats securely with double-sided non-slip rug tape. Do not have throw rugs and other things on the floor that can make you trip. What can I do in the bedroom? Use night lights. Make sure that you have a light by your bed that is easy to reach. Do not use any sheets or blankets that are too big for your bed. They should not hang down onto the floor. Have a firm chair that has side arms. You can use this for support while you get dressed. Do not have throw rugs and other things on the floor that can make you trip. What can I do in the kitchen? Clean up any spills right away. Avoid walking on wet floors. Keep items that you use a lot in easy-to-reach places. If you need to reach something above you, use a strong step stool that has a grab bar. Keep electrical cords out of the way. Do not use floor polish or  wax that makes floors slippery. If you must use wax, use non-skid floor wax. Do not have throw rugs and other things on the floor that can make you trip. What can I do with my stairs? Do not leave any items on the stairs. Make sure that there are handrails on both sides of the stairs and use them. Fix handrails that are broken or loose. Make sure that handrails are as long as the stairways. Check any carpeting to make sure that it is firmly attached to the stairs. Fix any carpet that is loose or worn. Avoid having throw rugs at the top or bottom of the stairs. If you do have throw rugs, attach them to the floor with carpet tape. Make sure that you have a light switch at the top of the stairs and the bottom of the stairs. If you do not have them, ask someone to add them for you. What else can I do to help prevent falls? Wear shoes that: Do not have high heels. Have  rubber bottoms. Are comfortable and fit you well. Are closed at the toe. Do not wear sandals. If you use a stepladder: Make sure that it is fully opened. Do not climb a closed stepladder. Make sure that both sides of the stepladder are locked into place. Ask someone to hold it for you, if possible. Clearly mark and make sure that you can see: Any grab bars or handrails. First and last steps. Where the edge of each step is. Use tools that help you move around (mobility aids) if they are needed. These include: Canes. Walkers. Scooters. Crutches. Turn on the lights when you go into a dark area. Replace any light bulbs as soon as they burn out. Set up your furniture so you have a clear path. Avoid moving your furniture around. If any of your floors are uneven, fix them. If there are any pets around you, be aware of where they are. Review your medicines with your doctor. Some medicines can make you feel dizzy. This can increase your chance of falling. Ask your doctor what other things that you can do to help prevent falls. This information is not intended to replace advice given to you by your health care provider. Make sure you discuss any questions you have with your health care provider. Document Released: 07/17/2009 Document Revised: 02/26/2016 Document Reviewed: 10/25/2014 Elsevier Interactive Patient Education  2017 [employer].

## 2023-04-19 ENCOUNTER — Ambulatory Visit: Payer: Medicare Other | Admitting: Internal Medicine

## 2023-05-02 ENCOUNTER — Ambulatory Visit (INDEPENDENT_AMBULATORY_CARE_PROVIDER_SITE_OTHER): Payer: Medicare Other | Admitting: Internal Medicine

## 2023-05-02 ENCOUNTER — Encounter: Payer: Self-pay | Admitting: Internal Medicine

## 2023-05-02 VITALS — BP 120/70 | HR 74 | Temp 98.2°F | Ht 65.0 in | Wt 156.0 lb

## 2023-05-02 DIAGNOSIS — K219 Gastro-esophageal reflux disease without esophagitis: Secondary | ICD-10-CM | POA: Diagnosis not present

## 2023-05-02 DIAGNOSIS — K5790 Diverticulosis of intestine, part unspecified, without perforation or abscess without bleeding: Secondary | ICD-10-CM | POA: Diagnosis not present

## 2023-05-02 DIAGNOSIS — R232 Flushing: Secondary | ICD-10-CM | POA: Diagnosis not present

## 2023-05-02 DIAGNOSIS — E785 Hyperlipidemia, unspecified: Secondary | ICD-10-CM

## 2023-05-02 MED ORDER — FAMOTIDINE 40 MG PO TABS: 40.0000 mg | ORAL_TABLET | Freq: Every day | ORAL | 3 refills | Status: DC

## 2023-05-02 MED ORDER — PANTOPRAZOLE SODIUM 40 MG PO TBEC: 40.0000 mg | DELAYED_RELEASE_TABLET | Freq: Every day | ORAL | 3 refills | Status: DC

## 2023-05-02 MED ORDER — VEOZAH 45 MG PO TABS: 45.0000 mg | ORAL_TABLET | Freq: Every day | ORAL | 5 refills | Status: DC

## 2023-05-02 NOTE — Assessment & Plan Note (Addendum)
Chronic - worse Options discussed Veozah vs HRT (Prempro)

## 2023-05-02 NOTE — Progress Notes (Signed)
Subjective:  Patient ID: Tina Bird, female    DOB: 1951/01/14  Age: 72 y.o. MRN: [id number]  CC: Follow-up (6 mnth f/u, discuss indigestion and waking up with headaches)   HPI Tina Bird presents for 6 mo f/u C/o diverticulitis type abd pain, HA, indigestion  Outpatient Medications Prior to Visit  Medication Sig Dispense Refill   acetaminophen (TYLENOL) 500 MG tablet Take 500 mg by mouth every 6 (six) hours as needed.     aspirin 81 MG chewable tablet Chew 81 mg by mouth daily.     B Complex Vitamins (B COMPLEX-B12 PO) Take 1 tablet by mouth daily.     escitalopram (LEXAPRO) 5 MG tablet Take 1 tablet (5 mg total) by mouth daily. 90 tablet 1   fexofenadine-pseudoephedrine (ALLEGRA-D 24) 180-240 MG 24 hr tablet Take 1 tablet by mouth daily.     loratadine (CLARITIN) 10 MG tablet Take 10 mg by mouth daily.     triamterene-hydrochlorothiazide (MAXZIDE-25) 37.5-25 MG tablet Take 1 tablet by mouth daily. 90 tablet 3   omeprazole (PRILOSEC) 40 MG capsule Take 1 capsule (40 mg total) by mouth daily. 90 capsule 3   Cholecalciferol 1000 UNITS tablet Take 1,000 Units by mouth daily. (Patient not taking: Reported on 10/19/2022)     fluticasone (FLONASE) 50 MCG/ACT nasal spray Place 2 sprays into both nostrils daily. (Patient not taking: Reported on 06/14/2022) 16 g 6   No facility-administered medications prior to visit.    ROS: Review of Systems  Constitutional:  Negative for activity change, appetite change, chills, fatigue and unexpected weight change.  HENT:  Negative for congestion, mouth sores and sinus pressure.   Eyes:  Negative for visual disturbance.  Respiratory:  Negative for cough and chest tightness.   Gastrointestinal:  Positive for abdominal pain and diarrhea. Negative for nausea.  Genitourinary:  Negative for difficulty urinating, frequency and vaginal pain.  Musculoskeletal:  Negative for back pain and gait problem.  Skin:  Negative for pallor and  rash.  Neurological:  Positive for headaches. Negative for dizziness, tremors, weakness and numbness.  Psychiatric/Behavioral:  Negative for confusion and sleep disturbance.     Objective:  BP 120/70 (BP Location: Left Arm, Patient Position: Sitting, Cuff Size: Large)   Pulse 74   Temp 98.2 F (36.8 C) (Oral)   Ht 5\' 5"  (1.651 m)   Wt 156 lb (70.8 kg)   SpO2 96%   BMI 25.96 kg/m   BP Readings from Last 3 Encounters:  05/02/23 120/70  01/06/23 114/76  10/19/22 (!) 150/90    Wt Readings from Last 3 Encounters:  05/02/23 156 lb (70.8 kg)  04/18/23 155 lb (70.3 kg)  01/06/23 158 lb (71.7 kg)    Physical Exam Constitutional:      General: She is not in acute distress.    Appearance: Normal appearance. She is well-developed.  HENT:     Head: Normocephalic.     Right Ear: External ear normal.     Left Ear: External ear normal.     Nose: Nose normal.  Eyes:     General:        Right eye: No discharge.        Left eye: No discharge.     Conjunctiva/sclera: Conjunctivae normal.     Pupils: Pupils are equal, round, and reactive to light.  Neck:     Thyroid: No thyromegaly.     Vascular: No JVD.     Trachea: No tracheal deviation.  Cardiovascular:  Rate and Rhythm: Normal rate and regular rhythm.     Heart sounds: Normal heart sounds.  Pulmonary:     Effort: No respiratory distress.     Breath sounds: No stridor. No wheezing.  Abdominal:     General: Bowel sounds are normal. There is no distension.     Palpations: Abdomen is soft. There is no mass.     Tenderness: There is no abdominal tenderness. There is no guarding or rebound.  Musculoskeletal:        General: No tenderness.     Cervical back: Normal range of motion and neck supple. No rigidity.  Lymphadenopathy:     Cervical: No cervical adenopathy.  Skin:    Findings: No erythema or rash.  Neurological:     Cranial Nerves: No cranial nerve deficit.     Motor: No abnormal muscle tone.     Coordination:  Coordination normal.     Deep Tendon Reflexes: Reflexes normal.  Psychiatric:        Behavior: Behavior normal.        Thought Content: Thought content normal.        Judgment: Judgment normal.     Lab Results  Component Value Date   WBC 5.9 10/19/2022   HGB 13.8 10/19/2022   HCT 39.7 10/19/2022   PLT 254.0 10/19/2022   GLUCOSE 97 10/19/2022   CHOL 228 (H) 10/19/2022   TRIG 273.0 (H) 10/19/2022   HDL 39.00 (L) 10/19/2022   LDLDIRECT 150.0 10/19/2022   LDLCALC 139 (H) 05/01/2020   ALT 30 10/19/2022   AST 28 10/19/2022   NA 137 10/19/2022   K 4.5 10/19/2022   CL 98 10/19/2022   CREATININE 0.89 10/19/2022   BUN 17 10/19/2022   CO2 32 10/19/2022   TSH 2.20 10/19/2022   HGBA1C 6.0 08/20/2021    MM 3D SCREEN BREAST BILATERAL  Result Date: 08/05/2021 CLINICAL DATA:  Screening. EXAM: DIGITAL SCREENING BILATERAL MAMMOGRAM WITH TOMOSYNTHESIS AND CAD TECHNIQUE: Bilateral screening digital craniocaudal and mediolateral oblique mammograms were obtained. Bilateral screening digital breast tomosynthesis was performed. The images were evaluated with computer-aided detection. COMPARISON:  Previous exam(s). ACR Breast Density Category b: There are scattered areas of fibroglandular density. FINDINGS: There are no findings suspicious for malignancy. IMPRESSION: No mammographic evidence of malignancy. A result letter of this screening mammogram will be mailed directly to the patient. RECOMMENDATION: Screening mammogram in one year. (Code:SM-B-01Y) BI-RADS CATEGORY  1: Negative. Electronically Signed   By: Ted Mcalpine M.D.   On: 08/05/2021 16:31    Assessment & Plan:   Problem List Items Addressed This Visit     Dyslipidemia    Coronary calcium score of 0.       GERD (gastroesophageal reflux disease) - Primary    Worse D/c Omeprazole Start Protonix in am, Pepcid at hs      Relevant Medications   famotidine (PEPCID) 40 MG tablet   pantoprazole (PROTONIX) 40 MG tablet    Diverticulosis    Dr Leone Payor Colon 2023 RTC if relapsed      Hot flashes    Chronic - worse Options discussed Veozah vs HRT (Prempro)         Meds ordered this encounter  Medications   famotidine (PEPCID) 40 MG tablet    Sig: Take 1 tablet (40 mg total) by mouth daily. qHS    Dispense:  90 tablet    Refill:  3   pantoprazole (PROTONIX) 40 MG tablet    Sig: Take 1  tablet (40 mg total) by mouth daily.    Dispense:  90 tablet    Refill:  3    qAM   Fezolinetant (VEOZAH) 45 MG TABS    Sig: Take 1 tablet (45 mg total) by mouth daily.    Dispense:  30 tablet    Refill:  5      Follow-up: No follow-ups on file.  Sonda Primes, MD

## 2023-05-02 NOTE — Assessment & Plan Note (Signed)
Worse D/c Omeprazole Start Protonix in am, Pepcid at hs

## 2023-05-02 NOTE — Assessment & Plan Note (Addendum)
Dr Leone Payor Colon 2023 RTC if relapsed

## 2023-05-02 NOTE — Assessment & Plan Note (Signed)
Coronary calcium score of 0. 

## 2023-05-11 ENCOUNTER — Other Ambulatory Visit: Payer: Self-pay | Admitting: Internal Medicine

## 2023-05-13 DIAGNOSIS — M9903 Segmental and somatic dysfunction of lumbar region: Secondary | ICD-10-CM | POA: Diagnosis not present

## 2023-05-13 DIAGNOSIS — M9905 Segmental and somatic dysfunction of pelvic region: Secondary | ICD-10-CM | POA: Diagnosis not present

## 2023-05-13 DIAGNOSIS — M9902 Segmental and somatic dysfunction of thoracic region: Secondary | ICD-10-CM | POA: Diagnosis not present

## 2023-05-13 DIAGNOSIS — M5417 Radiculopathy, lumbosacral region: Secondary | ICD-10-CM | POA: Diagnosis not present

## 2023-05-13 DIAGNOSIS — M5136 Other intervertebral disc degeneration, lumbar region: Secondary | ICD-10-CM | POA: Diagnosis not present

## 2023-05-17 DIAGNOSIS — M5136 Other intervertebral disc degeneration, lumbar region: Secondary | ICD-10-CM | POA: Diagnosis not present

## 2023-05-17 DIAGNOSIS — M5417 Radiculopathy, lumbosacral region: Secondary | ICD-10-CM | POA: Diagnosis not present

## 2023-05-17 DIAGNOSIS — M9902 Segmental and somatic dysfunction of thoracic region: Secondary | ICD-10-CM | POA: Diagnosis not present

## 2023-05-17 DIAGNOSIS — M9905 Segmental and somatic dysfunction of pelvic region: Secondary | ICD-10-CM | POA: Diagnosis not present

## 2023-05-17 DIAGNOSIS — M9903 Segmental and somatic dysfunction of lumbar region: Secondary | ICD-10-CM | POA: Diagnosis not present

## 2023-05-19 DIAGNOSIS — M5136 Other intervertebral disc degeneration, lumbar region: Secondary | ICD-10-CM | POA: Diagnosis not present

## 2023-05-19 DIAGNOSIS — M9903 Segmental and somatic dysfunction of lumbar region: Secondary | ICD-10-CM | POA: Diagnosis not present

## 2023-05-19 DIAGNOSIS — M9905 Segmental and somatic dysfunction of pelvic region: Secondary | ICD-10-CM | POA: Diagnosis not present

## 2023-05-19 DIAGNOSIS — M9902 Segmental and somatic dysfunction of thoracic region: Secondary | ICD-10-CM | POA: Diagnosis not present

## 2023-05-19 DIAGNOSIS — M5417 Radiculopathy, lumbosacral region: Secondary | ICD-10-CM | POA: Diagnosis not present

## 2023-05-25 DIAGNOSIS — M792 Neuralgia and neuritis, unspecified: Secondary | ICD-10-CM | POA: Diagnosis not present

## 2023-05-25 DIAGNOSIS — M2042 Other hammer toe(s) (acquired), left foot: Secondary | ICD-10-CM | POA: Diagnosis not present

## 2023-05-25 DIAGNOSIS — M7741 Metatarsalgia, right foot: Secondary | ICD-10-CM | POA: Diagnosis not present

## 2023-06-23 ENCOUNTER — Telehealth: Payer: Self-pay | Admitting: Internal Medicine

## 2023-06-23 NOTE — Telephone Encounter (Signed)
Soni a NP doing house calls saw the pt yesterday and th ept reported she is still having dizzy spells where she has to sit down and rest before she can continue her activities.   Pt states these spells are random and ' just come up on me.'  Pt also report that a few weeks ago she had a dizzy spell while driving, but was ale to recover.   Please call Soni with any questions: [phone]

## 2023-06-25 NOTE — Telephone Encounter (Signed)
Please make an appointment if problems.  Do not drive if dizzy.  Thank you

## 2023-06-27 NOTE — Telephone Encounter (Signed)
I was able to speak with [facility] and inform her of Dr. Loren Racer instructions. However, Romilda Garret has informed me she only goes to see the pt once a year.  I will contact pt and inform her of PCP's instruction's.

## 2023-06-28 NOTE — Telephone Encounter (Signed)
I was able to speak with the pt and made her an apptmnt to be seen by PCP for 06/30/2023.

## 2023-06-30 ENCOUNTER — Ambulatory Visit (INDEPENDENT_AMBULATORY_CARE_PROVIDER_SITE_OTHER): Payer: Medicare Other | Admitting: Internal Medicine

## 2023-06-30 ENCOUNTER — Encounter: Payer: Self-pay | Admitting: Internal Medicine

## 2023-06-30 VITALS — BP 100/70 | HR 76 | Temp 98.3°F | Ht 65.0 in | Wt 157.0 lb

## 2023-06-30 DIAGNOSIS — R55 Syncope and collapse: Secondary | ICD-10-CM

## 2023-06-30 DIAGNOSIS — M2042 Other hammer toe(s) (acquired), left foot: Secondary | ICD-10-CM | POA: Insufficient documentation

## 2023-06-30 DIAGNOSIS — G4700 Insomnia, unspecified: Secondary | ICD-10-CM | POA: Diagnosis not present

## 2023-06-30 DIAGNOSIS — K219 Gastro-esophageal reflux disease without esophagitis: Secondary | ICD-10-CM

## 2023-06-30 DIAGNOSIS — I951 Orthostatic hypotension: Secondary | ICD-10-CM | POA: Insufficient documentation

## 2023-06-30 DIAGNOSIS — F419 Anxiety disorder, unspecified: Secondary | ICD-10-CM | POA: Diagnosis not present

## 2023-06-30 LAB — URINALYSIS, ROUTINE W REFLEX MICROSCOPIC
Bilirubin Urine: NEGATIVE
Hgb urine dipstick: NEGATIVE
Ketones, ur: NEGATIVE
Nitrite: NEGATIVE
Specific Gravity, Urine: 1.01 (ref 1.000–1.030)
Total Protein, Urine: NEGATIVE
Urine Glucose: NEGATIVE
Urobilinogen, UA: 0.2 (ref 0.0–1.0)
pH: 6.5 (ref 5.0–8.0)

## 2023-06-30 LAB — CBC WITH DIFFERENTIAL/PLATELET
Basophils Absolute: 0.1 10*3/uL (ref 0.0–0.1)
Basophils Relative: 0.9 % (ref 0.0–3.0)
Eosinophils Absolute: 0.2 10*3/uL (ref 0.0–0.7)
Eosinophils Relative: 4.1 % (ref 0.0–5.0)
HCT: 39.2 % (ref 36.0–46.0)
Hemoglobin: 13.2 g/dL (ref 12.0–15.0)
Lymphocytes Relative: 31.9 % (ref 12.0–46.0)
Lymphs Abs: 1.8 10*3/uL (ref 0.7–4.0)
MCHC: 33.6 g/dL (ref 30.0–36.0)
MCV: 87.5 fl (ref 78.0–100.0)
Monocytes Absolute: 0.4 10*3/uL (ref 0.1–1.0)
Monocytes Relative: 6.9 % (ref 3.0–12.0)
Neutro Abs: 3.2 10*3/uL (ref 1.4–7.7)
Neutrophils Relative %: 56.2 % (ref 43.0–77.0)
Platelets: 263 10*3/uL (ref 150.0–400.0)
RBC: 4.48 Mil/uL (ref 3.87–5.11)
RDW: 14 % (ref 11.5–15.5)
WBC: 5.7 10*3/uL (ref 4.0–10.5)

## 2023-06-30 LAB — COMPREHENSIVE METABOLIC PANEL
ALT: 21 U/L (ref 0–35)
AST: 22 U/L (ref 0–37)
Albumin: 4.5 g/dL (ref 3.5–5.2)
Alkaline Phosphatase: 61 U/L (ref 39–117)
BUN: 19 mg/dL (ref 6–23)
CO2: 33 mEq/L — ABNORMAL HIGH (ref 19–32)
Calcium: 9.6 mg/dL (ref 8.4–10.5)
Chloride: 98 mEq/L (ref 96–112)
Creatinine, Ser: 0.96 mg/dL (ref 0.40–1.20)
GFR: 58.58 mL/min — ABNORMAL LOW (ref >60.00)
Glucose, Bld: 88 mg/dL (ref 70–99)
Potassium: 4 mEq/L (ref 3.5–5.1)
Sodium: 138 mEq/L (ref 135–145)
Total Bilirubin: 0.5 mg/dL (ref 0.2–1.2)
Total Protein: 7.5 g/dL (ref 6.0–8.3)

## 2023-06-30 LAB — TSH: TSH: 1.6 u[IU]/mL (ref 0.35–5.50)

## 2023-06-30 LAB — CORTISOL: Cortisol, Plasma: 6.2 ug/dL

## 2023-06-30 MED ORDER — ALPRAZOLAM 0.25 MG PO TABS: 0.2500 mg | ORAL_TABLET | Freq: Every evening | ORAL | 1 refills | Status: AC | PRN

## 2023-06-30 MED ORDER — PANTOPRAZOLE SODIUM 40 MG PO TBEC: 40.0000 mg | DELAYED_RELEASE_TABLET | Freq: Two times a day (BID) | ORAL | 11 refills | Status: DC

## 2023-06-30 NOTE — Assessment & Plan Note (Signed)
Xanax at hs prn insomnia Cont on Lexapro

## 2023-06-30 NOTE — Assessment & Plan Note (Signed)
Not better Use Protonix BID, Pepcid at hs prn  Potential benefits of a long term PPI use as well as potential risks  and complications were explained to the patient and were aknowledged. GI ref if not well

## 2023-06-30 NOTE — Assessment & Plan Note (Signed)
20 mm Hg SBP drop w/sx's D/c Maxzide

## 2023-06-30 NOTE — Progress Notes (Signed)
Subjective:  Patient ID: Tina Bird, female    DOB: [birth year]  Age: 72 y.o. MRN: [id number]  CC: Dizziness   HPI Tina Bird presents for daily lightheadedness x 3 weeks. No vertigo. No LOC. No CP/SB/DOE C/o insomnia C/o heartburn  Outpatient Medications Prior to Visit  Medication Sig Dispense Refill   acetaminophen (TYLENOL) 500 MG tablet Take 500 mg by mouth every 6 (six) hours as needed.     aspirin 81 MG chewable tablet Chew 81 mg by mouth daily.     B Complex Vitamins (B COMPLEX-B12 PO) Take 1 tablet by mouth daily.     escitalopram (LEXAPRO) 5 MG tablet TAKE 1 TABLET(5 MG) BY MOUTH DAILY 90 tablet 1   famotidine (PEPCID) 40 MG tablet Take 1 tablet (40 mg total) by mouth daily. qHS 90 tablet 3   fexofenadine-pseudoephedrine (ALLEGRA-D 24) 180-240 MG 24 hr tablet Take 1 tablet by mouth daily.     Fezolinetant (VEOZAH) 45 MG TABS Take 1 tablet (45 mg total) by mouth daily. 30 tablet 5   loratadine (CLARITIN) 10 MG tablet Take 10 mg by mouth daily.     pantoprazole (PROTONIX) 40 MG tablet Take 1 tablet (40 mg total) by mouth daily. 90 tablet 3   triamterene-hydrochlorothiazide (MAXZIDE-25) 37.5-25 MG tablet Take 1 tablet by mouth daily. 90 tablet 3   Cholecalciferol 1000 UNITS tablet Take 1,000 Units by mouth daily. (Patient not taking: Reported on 10/19/2022)     fluticasone (FLONASE) 50 MCG/ACT nasal spray Place 2 sprays into both nostrils daily. (Patient not taking: Reported on 06/14/2022) 16 g 6   No facility-administered medications prior to visit.    ROS: Review of Systems  Constitutional:  Positive for fatigue. Negative for activity change, appetite change, chills and unexpected weight change.  HENT:  Negative for congestion, mouth sores, sinus pressure and sore throat.   Eyes:  Negative for visual disturbance.  Respiratory:  Negative for cough and chest tightness.   Cardiovascular:  Negative for leg swelling.  Gastrointestinal:  Negative for  abdominal pain and nausea.  Genitourinary:  Negative for difficulty urinating, frequency and vaginal pain.  Musculoskeletal:  Negative for back pain and gait problem.  Skin:  Negative for pallor and rash.  Neurological:  Positive for light-headedness. Negative for dizziness, tremors, weakness, numbness and headaches.  Hematological:  Does not bruise/bleed easily.  Psychiatric/Behavioral:  Negative for confusion and sleep disturbance. The patient is not nervous/anxious.     Objective:  BP 100/70 (BP Location: Left Arm, Patient Position: Sitting, Cuff Size: Normal)   Pulse 76   Temp 98.3 F (36.8 C) (Oral)   Ht 5\' 5"  (1.651 m)   Wt 157 lb (71.2 kg)   SpO2 97%   BMI 26.13 kg/m   BP Readings from Last 3 Encounters:  06/30/23 100/70  05/02/23 120/70  01/06/23 114/76    Wt Readings from Last 3 Encounters:  06/30/23 157 lb (71.2 kg)  05/02/23 156 lb (70.8 kg)  04/18/23 155 lb (70.3 kg)    Physical Exam Constitutional:      General: She is not in acute distress.    Appearance: She is well-developed. She is not ill-appearing.  HENT:     Head: Normocephalic.     Right Ear: External ear normal.     Left Ear: External ear normal.     Nose: Nose normal.  Eyes:     General:        Right eye: No discharge.  Left eye: No discharge.     Conjunctiva/sclera: Conjunctivae normal.     Pupils: Pupils are equal, round, and reactive to light.  Neck:     Thyroid: No thyromegaly.     Vascular: No JVD.     Trachea: No tracheal deviation.  Cardiovascular:     Rate and Rhythm: Normal rate and regular rhythm.     Heart sounds: Normal heart sounds.  Pulmonary:     Effort: No respiratory distress.     Breath sounds: No stridor. No wheezing.  Abdominal:     General: Bowel sounds are normal. There is no distension.     Palpations: Abdomen is soft. There is no mass.     Tenderness: There is no abdominal tenderness. There is no guarding or rebound.  Musculoskeletal:        General: No  tenderness.     Cervical back: Normal range of motion and neck supple. No rigidity.  Lymphadenopathy:     Cervical: No cervical adenopathy.  Skin:    Findings: No erythema or rash.  Neurological:     Cranial Nerves: No cranial nerve deficit.     Motor: No abnormal muscle tone.     Coordination: Coordination normal.     Deep Tendon Reflexes: Reflexes normal.  Psychiatric:        Behavior: Behavior normal.        Thought Content: Thought content normal.        Judgment: Judgment normal.   Lightheaded when she sat up and stood up  Lab Results  Component Value Date   WBC 5.9 10/19/2022   HGB 13.8 10/19/2022   HCT 39.7 10/19/2022   PLT 254.0 10/19/2022   GLUCOSE 97 10/19/2022   CHOL 228 (H) 10/19/2022   TRIG 273.0 (H) 10/19/2022   HDL 39.00 (L) 10/19/2022   LDLDIRECT 150.0 10/19/2022   LDLCALC 139 (H) 05/01/2020   ALT 30 10/19/2022   AST 28 10/19/2022   NA 137 10/19/2022   K 4.5 10/19/2022   CL 98 10/19/2022   CREATININE 0.89 10/19/2022   BUN 17 10/19/2022   CO2 32 10/19/2022   TSH 2.20 10/19/2022   HGBA1C 6.0 08/20/2021    MM 3D SCREEN BREAST BILATERAL  Result Date: 08/05/2021 CLINICAL DATA:  Screening. EXAM: DIGITAL SCREENING BILATERAL MAMMOGRAM WITH TOMOSYNTHESIS AND CAD TECHNIQUE: Bilateral screening digital craniocaudal and mediolateral oblique mammograms were obtained. Bilateral screening digital breast tomosynthesis was performed. The images were evaluated with computer-aided detection. COMPARISON:  Previous exam(s). ACR Breast Density Category b: There are scattered areas of fibroglandular density. FINDINGS: There are no findings suspicious for malignancy. IMPRESSION: No mammographic evidence of malignancy. A result letter of this screening mammogram will be mailed directly to the patient. RECOMMENDATION: Screening mammogram in one year. (Code:SM-B-01Y) BI-RADS CATEGORY  1: Negative. Electronically Signed   By: Ted Mcalpine M.D.   On: 08/05/2021 16:31     Assessment & Plan:   Problem List Items Addressed This Visit     Near syncope    20 mm Hg SBP drop w/sx's D/c Maxzide      Relevant Orders   CBC with Differential/Platelet   Comprehensive metabolic panel   TSH   Cortisol   Urinalysis   GERD (gastroesophageal reflux disease)    Not better Use Protonix BID, Pepcid at hs prn  Potential benefits of a long term PPI use as well as potential risks  and complications were explained to the patient and were aknowledged. GI ref if not well  Relevant Medications   pantoprazole (PROTONIX) 40 MG tablet   Anxiety disorder    Xanax at hs prn insomnia Cont on Lexapro      Relevant Medications   ALPRAZolam (XANAX) 0.25 MG tablet   Other Relevant Orders   Urinalysis   Orthostatic hypotension - Primary    20 mm Hg SBP drop w/sx's D/c Maxzide      Relevant Orders   CBC with Differential/Platelet   Comprehensive metabolic panel   TSH   Cortisol   Insomnia    Xanax at hs prn insomnia Cont on Lexapro         Meds ordered this encounter  Medications   ALPRAZolam (XANAX) 0.25 MG tablet    Sig: Take 1 tablet (0.25 mg total) by mouth at bedtime as needed for sleep.    Dispense:  30 tablet    Refill:  1   pantoprazole (PROTONIX) 40 MG tablet    Sig: Take 1 tablet (40 mg total) by mouth 2 (two) times daily.    Dispense:  60 tablet    Refill:  11      Follow-up: Return in about 4 weeks (around 07/28/2023) for a follow-up visit.  Sonda Primes, MD

## 2023-07-05 ENCOUNTER — Telehealth: Payer: Self-pay | Admitting: Internal Medicine

## 2023-07-05 NOTE — Telephone Encounter (Signed)
Pt called wanting Dr. Macario Golds to know that her reading for her BP is reading 166/90 currently this morning. Pt wanting to run this by Dr. Macario Golds to see what he wanting her to do. Please advise.

## 2023-07-07 NOTE — Telephone Encounter (Signed)
Continue to monitor blood pressure.  Sending blood pressure readings in 2 weeks.  Thanks

## 2023-07-07 NOTE — Telephone Encounter (Signed)
Spoke with pt and was able to inform her of Dr.Plotnikov's instructions. °

## 2023-07-08 ENCOUNTER — Ambulatory Visit (INDEPENDENT_AMBULATORY_CARE_PROVIDER_SITE_OTHER): Payer: Medicare Other | Admitting: Internal Medicine

## 2023-07-08 ENCOUNTER — Encounter: Payer: Self-pay | Admitting: Internal Medicine

## 2023-07-08 VITALS — BP 138/74 | HR 80 | Temp 98.2°F | Ht 65.0 in | Wt 161.0 lb

## 2023-07-08 DIAGNOSIS — I10 Essential (primary) hypertension: Secondary | ICD-10-CM | POA: Diagnosis not present

## 2023-07-08 MED ORDER — TELMISARTAN 20 MG PO TABS: 20.0000 mg | ORAL_TABLET | Freq: Every day | ORAL | 5 refills | Status: DC

## 2023-07-08 NOTE — Progress Notes (Signed)
Subjective:    Patient ID: Tina Bird, female    DOB: 03/06/1951, 72 y.o.   MRN: [id number]      HPI Irulan is here for  Chief Complaint  Patient presents with   Hypertension     BP - was on medication-triamterene-hydrochlorothiazide- taken off 9/26 due to orthostatic hypotension and lightheadedness.  1 day she was driving  and felt like she was going to pass out.  BP was low after that which is why she came in at that time.  Recently her blood pressure has been elevated-BP at home 166/90, 177/94 - slight headache  This morning her fingers were tingling like they were swollen and they appeared a little bit swollen.  She also had puffiness in her eyes.  She denies any lower extremity swelling.  She is very concerned because her blood pressure has been elevated.     Medications and allergies reviewed with patient and updated if appropriate.  Current Outpatient Medications on File Prior to Visit  Medication Sig Dispense Refill   acetaminophen (TYLENOL) 500 MG tablet Take 500 mg by mouth every 6 (six) hours as needed.     ALPRAZolam (XANAX) 0.25 MG tablet Take 1 tablet (0.25 mg total) by mouth at bedtime as needed for sleep. 30 tablet 1   aspirin 81 MG chewable tablet Chew 81 mg by mouth daily.     B Complex Vitamins (B COMPLEX-B12 PO) Take 1 tablet by mouth daily.     escitalopram (LEXAPRO) 5 MG tablet TAKE 1 TABLET(5 MG) BY MOUTH DAILY 90 tablet 1   famotidine (PEPCID) 40 MG tablet Take 1 tablet (40 mg total) by mouth daily. qHS 90 tablet 3   fexofenadine-pseudoephedrine (ALLEGRA-D 24) 180-240 MG 24 hr tablet Take 1 tablet by mouth daily.     Fezolinetant (VEOZAH) 45 MG TABS Take 1 tablet (45 mg total) by mouth daily. 30 tablet 5   fluticasone (FLONASE) 50 MCG/ACT nasal spray Place 2 sprays into both nostrils daily. 16 g 6   loratadine (CLARITIN) 10 MG tablet Take 10 mg by mouth daily.     pantoprazole (PROTONIX) 40 MG tablet Take 1 tablet (40 mg total) by mouth 2  (two) times daily. 60 tablet 11   Cholecalciferol 1000 UNITS tablet Take 1,000 Units by mouth daily. (Patient not taking: Reported on 10/19/2022)     No current facility-administered medications on file prior to visit.    Review of Systems  Constitutional:  Positive for fatigue.  Respiratory:  Negative for shortness of breath.   Cardiovascular:  Positive for palpitations (transient - occ). Negative for chest pain and leg swelling.       Swelling in hands - mild  Neurological:  Positive for light-headedness and headaches (mild). Negative for dizziness.       Objective:   Vitals:   07/08/23 1050  BP: 138/74  Pulse: 80  Temp: 98.2 F (36.8 C)  SpO2: 95%   BP Readings from Last 3 Encounters:  07/08/23 138/74  06/30/23 100/70  05/02/23 120/70   Wt Readings from Last 3 Encounters:  07/08/23 161 lb (73 kg)  06/30/23 157 lb (71.2 kg)  05/02/23 156 lb (70.8 kg)   Body mass index is 26.79 kg/m.    Physical Exam Constitutional:      General: She is not in acute distress.    Appearance: Normal appearance.  HENT:     Head: Normocephalic and atraumatic.  Eyes:     Conjunctiva/sclera: Conjunctivae normal.  Cardiovascular:     Rate and Rhythm: Normal rate and regular rhythm.     Heart sounds: Normal heart sounds.  Pulmonary:     Effort: Pulmonary effort is normal. No respiratory distress.     Breath sounds: Normal breath sounds. No wheezing.  Musculoskeletal:     Cervical back: Neck supple.     Right lower leg: No edema.     Left lower leg: No edema.  Lymphadenopathy:     Cervical: No cervical adenopathy.  Skin:    General: Skin is warm and dry.     Findings: No rash.  Neurological:     Mental Status: She is alert. Mental status is at baseline.  Psychiatric:        Mood and Affect: Mood normal.        Behavior: Behavior normal.            Assessment & Plan:    Essential hypertension: Chronic Recently taken off blood pressure  medication-triamterene-hydrochlorothiazide secondary to low BP for which she was symptomatic recently her blood pressure has been elevated and she has had some mild headaches and has not felt good and been very concerned about her blood pressure Start telmisartan 20 mg daily Continue monitoring blood pressure Typically she does not get dehydrated-continue good amount of fluids Low-sodium diet, regular exercise Has follow-up with PCP the end of this month and will keep that appointment and call sooner if needed

## 2023-07-08 NOTE — Patient Instructions (Addendum)
        Medications changes include :   telmisartan 20 mg daily    Monitor your BP at home.      Return for follow up as scheduled.

## 2023-07-28 ENCOUNTER — Ambulatory Visit: Payer: Medicare Other | Admitting: Internal Medicine

## 2023-07-28 ENCOUNTER — Encounter: Payer: Self-pay | Admitting: Internal Medicine

## 2023-07-28 VITALS — BP 130/78 | HR 83 | Temp 97.9°F | Ht 65.0 in | Wt 161.0 lb

## 2023-07-28 DIAGNOSIS — R55 Syncope and collapse: Secondary | ICD-10-CM

## 2023-07-28 DIAGNOSIS — R42 Dizziness and giddiness: Secondary | ICD-10-CM

## 2023-07-28 DIAGNOSIS — I10 Essential (primary) hypertension: Secondary | ICD-10-CM | POA: Diagnosis not present

## 2023-07-28 DIAGNOSIS — N951 Menopausal and female climacteric states: Secondary | ICD-10-CM

## 2023-07-28 DIAGNOSIS — I951 Orthostatic hypotension: Secondary | ICD-10-CM

## 2023-07-28 MED ORDER — TELMISARTAN 20 MG PO TABS: 20.0000 mg | ORAL_TABLET | Freq: Two times a day (BID) | ORAL | 5 refills | Status: DC

## 2023-07-28 NOTE — Assessment & Plan Note (Signed)
No orthostatic BP drop off Maxzide Elevated BP. Orthostatic sx's w/o any BP drop registered On Micardis 20 mg/d - increase to 20 mg bid

## 2023-07-28 NOTE — Progress Notes (Signed)
Subjective:  Patient ID: Tina Bird, female    DOB: 1950/11/21  Age: 72 y.o. MRN: [id number]  CC: Follow-up (4 week f/u, Discuss BP and headaches )   HPI Tina Bird presents for HTN - SBP 170 at home F/u on dizziness, lightheadedness at times. [facility] BP monitor showed 168/90 this am here  Outpatient Medications Prior to Visit  Medication Sig Dispense Refill   acetaminophen (TYLENOL) 500 MG tablet Take 500 mg by mouth every 6 (six) hours as needed.     ALPRAZolam (XANAX) 0.25 MG tablet Take 1 tablet (0.25 mg total) by mouth at bedtime as needed for sleep. 30 tablet 1   aspirin 81 MG chewable tablet Chew 81 mg by mouth daily.     B Complex Vitamins (B COMPLEX-B12 PO) Take 1 tablet by mouth daily.     escitalopram (LEXAPRO) 5 MG tablet TAKE 1 TABLET(5 MG) BY MOUTH DAILY 90 tablet 1   famotidine (PEPCID) 40 MG tablet Take 1 tablet (40 mg total) by mouth daily. qHS 90 tablet 3   fexofenadine-pseudoephedrine (ALLEGRA-D 24) 180-240 MG 24 hr tablet Take 1 tablet by mouth daily.     Fezolinetant (VEOZAH) 45 MG TABS Take 1 tablet (45 mg total) by mouth daily. 30 tablet 5   fluticasone (FLONASE) 50 MCG/ACT nasal spray Place 2 sprays into both nostrils daily. 16 g 6   loratadine (CLARITIN) 10 MG tablet Take 10 mg by mouth daily.     pantoprazole (PROTONIX) 40 MG tablet Take 1 tablet (40 mg total) by mouth 2 (two) times daily. 60 tablet 11   telmisartan (MICARDIS) 20 MG tablet Take 1 tablet (20 mg total) by mouth daily. 30 tablet 5   Cholecalciferol 1000 UNITS tablet Take 1,000 Units by mouth daily. (Patient not taking: Reported on 10/19/2022)     No facility-administered medications prior to visit.    ROS: Review of Systems  Constitutional:  Negative for activity change, appetite change, chills, fatigue and unexpected weight change.  HENT:  Negative for congestion, mouth sores and sinus pressure.   Eyes:  Negative for visual disturbance.  Respiratory:  Negative for cough  and chest tightness.   Gastrointestinal:  Negative for abdominal pain and nausea.  Genitourinary:  Negative for difficulty urinating, frequency and vaginal pain.  Musculoskeletal:  Positive for arthralgias. Negative for back pain and gait problem.  Skin:  Negative for pallor and rash.  Neurological:  Positive for dizziness, light-headedness and headaches. Negative for tremors, syncope, weakness and numbness.  Psychiatric/Behavioral:  Negative for confusion and sleep disturbance.     Objective:  BP 130/78 (BP Location: Left Arm, Patient Position: Sitting, Cuff Size: Normal)   Pulse 83   Temp 97.9 F (36.6 C) (Oral)   Ht 5\' 5"  (1.651 m)   Wt 161 lb (73 kg)   SpO2 92%   BMI 26.79 kg/m   BP Readings from Last 3 Encounters:  07/28/23 130/78  07/08/23 138/74  06/30/23 100/70    Wt Readings from Last 3 Encounters:  07/28/23 161 lb (73 kg)  07/08/23 161 lb (73 kg)  06/30/23 157 lb (71.2 kg)    Physical Exam Constitutional:      General: She is not in acute distress.    Appearance: She is well-developed. She is obese.  HENT:     Head: Normocephalic.     Right Ear: External ear normal.     Left Ear: External ear normal.     Nose: Nose normal.  Eyes:  General:        Right eye: No discharge.        Left eye: No discharge.     Conjunctiva/sclera: Conjunctivae normal.     Pupils: Pupils are equal, round, and reactive to light.  Neck:     Thyroid: No thyromegaly.     Vascular: No JVD.     Trachea: No tracheal deviation.  Cardiovascular:     Rate and Rhythm: Normal rate and regular rhythm.     Heart sounds: Normal heart sounds.  Pulmonary:     Effort: No respiratory distress.     Breath sounds: No stridor. No wheezing.  Abdominal:     General: Bowel sounds are normal. There is no distension.     Palpations: Abdomen is soft. There is no mass.     Tenderness: There is no abdominal tenderness. There is no guarding or rebound.  Musculoskeletal:        General: No  tenderness.     Cervical back: Normal range of motion and neck supple. No rigidity.  Lymphadenopathy:     Cervical: No cervical adenopathy.  Skin:    Findings: No erythema or rash.  Neurological:     Cranial Nerves: No cranial nerve deficit.     Motor: No abnormal muscle tone.     Coordination: Coordination normal.     Deep Tendon Reflexes: Reflexes normal.  Psychiatric:        Behavior: Behavior normal.        Thought Content: Thought content normal.        Judgment: Judgment normal.     Lab Results  Component Value Date   WBC 5.7 06/30/2023   HGB 13.2 06/30/2023   HCT 39.2 06/30/2023   PLT 263.0 06/30/2023   GLUCOSE 88 06/30/2023   CHOL 228 (H) 10/19/2022   TRIG 273.0 (H) 10/19/2022   HDL 39.00 (L) 10/19/2022   LDLDIRECT 150.0 10/19/2022   LDLCALC 139 (H) 05/01/2020   ALT 21 06/30/2023   AST 22 06/30/2023   NA 138 06/30/2023   K 4.0 06/30/2023   CL 98 06/30/2023   CREATININE 0.96 06/30/2023   BUN 19 06/30/2023   CO2 33 (H) 06/30/2023   TSH 1.60 06/30/2023   HGBA1C 6.0 08/20/2021    MM 3D SCREEN BREAST BILATERAL  Result Date: 08/05/2021 CLINICAL DATA:  Screening. EXAM: DIGITAL SCREENING BILATERAL MAMMOGRAM WITH TOMOSYNTHESIS AND CAD TECHNIQUE: Bilateral screening digital craniocaudal and mediolateral oblique mammograms were obtained. Bilateral screening digital breast tomosynthesis was performed. The images were evaluated with computer-aided detection. COMPARISON:  Previous exam(s). ACR Breast Density Category b: There are scattered areas of fibroglandular density. FINDINGS: There are no findings suspicious for malignancy. IMPRESSION: No mammographic evidence of malignancy. A result letter of this screening mammogram will be mailed directly to the patient. RECOMMENDATION: Screening mammogram in one year. (Code:SM-B-01Y) BI-RADS CATEGORY  1: Negative. Electronically Signed   By: Ted Mcalpine M.D.   On: 08/05/2021 16:31    Assessment & Plan:   Problem List Items  Addressed This Visit     MENOPAUSAL SYNDROME    No orthostatic BP drop off Maxzide Elevated BP. Orthostatic sx's w/o any BP drop registered On Micardis 20 mg/d - increase to 20 mg bid      DIZZINESS    No orthostatic BP drop off Maxzide Elevated BP. Orthostatic sx's w/o any BP drop registered On Micardis 20 mg/d - increase to 20 mg bid H/o BPV       Near syncope -  Primary    No relapse Elevated BP. Orthostatic sx's w/o any BP drop registered On Micardis 20 mg/d      Relevant Medications   telmisartan (MICARDIS) 20 MG tablet   Essential hypertension, benign    No orthostatic BP drop off Maxzide Elevated BP. Orthostatic sx's w/o any BP drop registered On Micardis 20 mg/d - increase to 20 mg bid       Relevant Medications   telmisartan (MICARDIS) 20 MG tablet   Orthostatic hypotension    No orthostatic BP drop off Maxzide Elevated BP. Orthostatic sx's w/o any BP drop registered On Micardis 20 mg/d - increase to 20 mg bid      Relevant Medications   telmisartan (MICARDIS) 20 MG tablet      Meds ordered this encounter  Medications   telmisartan (MICARDIS) 20 MG tablet    Sig: Take 1 tablet (20 mg total) by mouth 2 (two) times daily.    Dispense:  60 tablet    Refill:  5      Follow-up: Return in about 4 months (around 11/28/2023) for a follow-up visit.  Sonda Primes, MD

## 2023-07-28 NOTE — Assessment & Plan Note (Signed)
No orthostatic BP drop off Maxzide Elevated BP. Orthostatic sx's w/o any BP drop registered On Micardis 20 mg/d - increase to 20 mg bid H/o BPV

## 2023-07-28 NOTE — Assessment & Plan Note (Signed)
No relapse Elevated BP. Orthostatic sx's w/o any BP drop registered On Micardis 20 mg/d

## 2023-07-30 ENCOUNTER — Other Ambulatory Visit: Payer: Self-pay | Admitting: Internal Medicine

## 2023-08-16 ENCOUNTER — Encounter: Payer: Self-pay | Admitting: *Deleted

## 2023-09-14 ENCOUNTER — Ambulatory Visit: Payer: Medicare Other | Admitting: Internal Medicine

## 2023-09-14 ENCOUNTER — Encounter: Payer: Self-pay | Admitting: Internal Medicine

## 2023-09-14 VITALS — BP 148/78 | HR 72 | Temp 97.7°F | Ht 65.0 in | Wt 158.6 lb

## 2023-09-14 DIAGNOSIS — N951 Menopausal and female climacteric states: Secondary | ICD-10-CM

## 2023-09-14 DIAGNOSIS — R232 Flushing: Secondary | ICD-10-CM | POA: Diagnosis not present

## 2023-09-14 DIAGNOSIS — I951 Orthostatic hypotension: Secondary | ICD-10-CM | POA: Diagnosis not present

## 2023-09-14 DIAGNOSIS — E785 Hyperlipidemia, unspecified: Secondary | ICD-10-CM

## 2023-09-14 MED ORDER — PROGESTERONE MICRONIZED 100 MG PO CAPS: 100.0000 mg | ORAL_CAPSULE | Freq: Every day | ORAL | 5 refills | Status: DC

## 2023-09-14 MED ORDER — ESTRADIOL 0.0375 MG/24HR TD PTWK: 0.0375 mg | MEDICATED_PATCH | TRANSDERMAL | 5 refills | Status: DC

## 2023-09-14 MED ORDER — ESTRADIOL 0.0375 MG/24HR TD PTWK: 0.0375 mg | MEDICATED_PATCH | TRANSDERMAL | 5 refills | Status: AC

## 2023-09-14 MED ORDER — OLMESARTAN MEDOXOMIL-HCTZ 20-12.5 MG PO TABS
1.0000 | ORAL_TABLET | Freq: Every day | ORAL | 3 refills | Status: DC
Start: 2023-09-14 — End: 2024-07-19

## 2023-09-14 NOTE — Assessment & Plan Note (Addendum)
2020 Coronary calcium CT score of 0.  Start Climara patch, Progesterone 100 mg/d HRT risks discussed

## 2023-09-14 NOTE — Assessment & Plan Note (Signed)
2020 Coronary calcium CT score of 0

## 2023-09-14 NOTE — Assessment & Plan Note (Signed)
No relapse 

## 2023-09-14 NOTE — Assessment & Plan Note (Addendum)
Worse Start Climara patch, Progesterone 100 mg/d  Potential benefits of a long term HRTuse as well as potential risks (BPH, MI, OSA, cancer)  and complications were explained to the patient and were aknowledged.

## 2023-09-14 NOTE — Progress Notes (Signed)
Subjective:  Patient ID: Tina Bird, female    DOB: 07/21/1951  Age: 72 y.o. MRN: [id number]  CC: Medication Problem (PT's blood pressure medication changed during last visit. Notes of more fluid build up since changing to this medication. PT gets tingling sensations in fingers and toes at night.) and Dizziness (PT notes of past history of vertigo. Having more frequent dizziness and hot flashes, near syncope)   HPI Tina Bird presents for HTN, occ edema C/o vertigo - lifelong C/o bad hot flashes x years, worse....  Outpatient Medications Prior to Visit  Medication Sig Dispense Refill   acetaminophen (TYLENOL) 500 MG tablet Take 500 mg by mouth every 6 (six) hours as needed.     ALPRAZolam (XANAX) 0.25 MG tablet Take 1 tablet (0.25 mg total) by mouth at bedtime as needed for sleep. 30 tablet 1   aspirin 81 MG chewable tablet Chew 81 mg by mouth daily.     B Complex Vitamins (B COMPLEX-B12 PO) Take 1 tablet by mouth daily.     escitalopram (LEXAPRO) 5 MG tablet TAKE 1 TABLET(5 MG) BY MOUTH DAILY 90 tablet 1   famotidine (PEPCID) 40 MG tablet Take 1 tablet (40 mg total) by mouth daily. qHS 90 tablet 3   pantoprazole (PROTONIX) 40 MG tablet Take 1 tablet (40 mg total) by mouth 2 (two) times daily. 60 tablet 11   telmisartan (MICARDIS) 20 MG tablet Take 1 tablet (20 mg total) by mouth 2 (two) times daily. 60 tablet 5   Cholecalciferol 1000 UNITS tablet Take 1,000 Units by mouth daily. (Patient not taking: Reported on 10/19/2022)     fexofenadine-pseudoephedrine (ALLEGRA-D 24) 180-240 MG 24 hr tablet Take 1 tablet by mouth daily. (Patient not taking: Reported on 09/14/2023)     fluticasone (FLONASE) 50 MCG/ACT nasal spray Place 2 sprays into both nostrils daily. (Patient not taking: Reported on 09/14/2023) 16 g 6   loratadine (CLARITIN) 10 MG tablet Take 10 mg by mouth daily. (Patient not taking: Reported on 09/14/2023)     Fezolinetant (VEOZAH) 45 MG TABS Take 1 tablet (45  mg total) by mouth daily. (Patient not taking: Reported on 09/14/2023) 30 tablet 5   No facility-administered medications prior to visit.    ROS: Review of Systems  Constitutional:  Negative for activity change, appetite change, chills, fatigue and unexpected weight change.  HENT:  Negative for congestion, mouth sores and sinus pressure.   Eyes:  Negative for visual disturbance.  Respiratory:  Negative for cough and chest tightness.   Gastrointestinal:  Negative for abdominal pain and nausea.  Genitourinary:  Negative for difficulty urinating, frequency and vaginal pain.  Musculoskeletal:  Negative for back pain and gait problem.  Skin:  Negative for pallor and rash.  Neurological:  Positive for dizziness. Negative for tremors, weakness, numbness and headaches.  Hematological:  Does not bruise/bleed easily.  Psychiatric/Behavioral:  Negative for confusion and sleep disturbance. The patient is not nervous/anxious.     Objective:  BP (!) 148/78   Pulse 72   Temp 97.7 F (36.5 C) (Oral)   Ht 5\' 5"  (1.651 m)   Wt 158 lb 9.6 oz (71.9 kg)   SpO2 96%   BMI 26.39 kg/m   BP Readings from Last 3 Encounters:  09/14/23 (!) 148/78  07/28/23 130/78  07/08/23 138/74    Wt Readings from Last 3 Encounters:  09/14/23 158 lb 9.6 oz (71.9 kg)  07/28/23 161 lb (73 kg)  07/08/23 161 lb (73 kg)  Physical Exam Constitutional:      General: She is not in acute distress.    Appearance: She is well-developed.  HENT:     Head: Normocephalic.     Right Ear: External ear normal.     Left Ear: External ear normal.     Nose: Nose normal.  Eyes:     General:        Right eye: No discharge.        Left eye: No discharge.     Conjunctiva/sclera: Conjunctivae normal.     Pupils: Pupils are equal, round, and reactive to light.  Neck:     Thyroid: No thyromegaly.     Vascular: No JVD.     Trachea: No tracheal deviation.  Cardiovascular:     Rate and Rhythm: Normal rate and regular rhythm.      Heart sounds: Normal heart sounds.  Pulmonary:     Effort: No respiratory distress.     Breath sounds: No stridor. No wheezing.  Abdominal:     General: Bowel sounds are normal. There is no distension.     Palpations: Abdomen is soft. There is no mass.     Tenderness: There is no abdominal tenderness. There is no guarding or rebound.  Musculoskeletal:        General: No tenderness.     Cervical back: Normal range of motion and neck supple. No rigidity.  Lymphadenopathy:     Cervical: No cervical adenopathy.  Skin:    Findings: No erythema or rash.  Neurological:     Mental Status: She is oriented to person, place, and time.     Cranial Nerves: No cranial nerve deficit.     Motor: No abnormal muscle tone.     Coordination: Coordination normal.     Deep Tendon Reflexes: Reflexes normal.  Psychiatric:        Behavior: Behavior normal.        Thought Content: Thought content normal.        Judgment: Judgment normal.     Lab Results  Component Value Date   WBC 5.7 06/30/2023   HGB 13.2 06/30/2023   HCT 39.2 06/30/2023   PLT 263.0 06/30/2023   GLUCOSE 88 06/30/2023   CHOL 228 (H) 10/19/2022   TRIG 273.0 (H) 10/19/2022   HDL 39.00 (L) 10/19/2022   LDLDIRECT 150.0 10/19/2022   LDLCALC 139 (H) 05/01/2020   ALT 21 06/30/2023   AST 22 06/30/2023   NA 138 06/30/2023   K 4.0 06/30/2023   CL 98 06/30/2023   CREATININE 0.96 06/30/2023   BUN 19 06/30/2023   CO2 33 (H) 06/30/2023   TSH 1.60 06/30/2023   HGBA1C 6.0 08/20/2021    MM 3D SCREEN BREAST BILATERAL  Result Date: 08/05/2021 CLINICAL DATA:  Screening. EXAM: DIGITAL SCREENING BILATERAL MAMMOGRAM WITH TOMOSYNTHESIS AND CAD TECHNIQUE: Bilateral screening digital craniocaudal and mediolateral oblique mammograms were obtained. Bilateral screening digital breast tomosynthesis was performed. The images were evaluated with computer-aided detection. COMPARISON:  Previous exam(s). ACR Breast Density Category b: There are  scattered areas of fibroglandular density. FINDINGS: There are no findings suspicious for malignancy. IMPRESSION: No mammographic evidence of malignancy. A result letter of this screening mammogram will be mailed directly to the patient. RECOMMENDATION: Screening mammogram in one year. (Code:SM-B-01Y) BI-RADS CATEGORY  1: Negative. Electronically Signed   By: Ted Mcalpine M.D.   On: 08/05/2021 16:31    Assessment & Plan:   Problem List Items Addressed This Visit  MENOPAUSAL SYNDROME - Primary    Worse Start Climara patch, Progesterone 100 mg/d  Potential benefits of a long term HRTuse as well as potential risks (BPH, MI, OSA, cancer)  and complications were explained to the patient and were aknowledged.      Dyslipidemia    2020 Coronary calcium CT score of 0.       Hot flashes    2020 Coronary calcium CT score of 0.  Start Climara patch, Progesterone 100 mg/d HRT risks discussed      Relevant Medications   olmesartan-hydrochlorothiazide (BENICAR HCT) 20-12.5 MG tablet   Orthostatic hypotension    No relapse      Relevant Medications   olmesartan-hydrochlorothiazide (BENICAR HCT) 20-12.5 MG tablet      Meds ordered this encounter  Medications   olmesartan-hydrochlorothiazide (BENICAR HCT) 20-12.5 MG tablet    Sig: Take 1 tablet by mouth daily.    Dispense:  90 tablet    Refill:  3   estradiol (CLIMARA) 0.0375 mg/24hr patch    Sig: Place 1 patch (0.0375 mg total) onto the skin once a week.    Dispense:  4 patch    Refill:  5   progesterone (PROMETRIUM) 100 MG capsule    Sig: Take 1 capsule (100 mg total) by mouth daily.    Dispense:  30 capsule    Refill:  5      Follow-up: Return in about 3 months (around 12/13/2023) for a follow-up visit.  Sonda Primes, MD

## 2023-09-30 ENCOUNTER — Ambulatory Visit: Payer: Self-pay | Admitting: Internal Medicine

## 2023-09-30 NOTE — Telephone Encounter (Signed)
  Chief Complaint: neck gland pain Symptoms: pain Frequency: 3 days Pertinent Negatives: Patient denies injury, denies fever, denies CMS problems,   Disposition: [] ED /[] Urgent Care (no appt availability in office) / [x] Appointment(In office/virtual)/ []  [facility]/ [x] Home Care/ [] Refused Recommended Disposition /[] [facility]/ []  Follow-up with PCP  Additional Notes: Pt would like an abx for the pain. Pt sched for appt 1/6, soonest avail. Advised to call back if s/s worsen. Advised to cont using tylenol and ibuprofen as this is controlling the pain. Pt agreeable. Pt admits she has been struggling with allergy s/s.   Copied from CRM [id number]. Topic: Clinical - Medical Advice >> Sep 30, 2023  8:50 AM Fuller Mandril wrote: Reason for CRM: Pt called states she has some pain in the gland in her neck area. States no swelling that she knows of. 1st appt 1/6 for any provider and 1/13 for her provider. Would like to know if anything could be done in the meantime. Thank You Reason for Disposition  [1] Age > 50 AND [2] no history of prior similar neck pain  Answer Assessment - Initial Assessment Questions 1. ONSET: "When did the pain begin?"      3 days 2. LOCATION: "Where does it hurt?"      L neck gland pain 3. PATTERN "Does the pain come and go, or has it been constant since it started?"      intermittent 4. SEVERITY: "How bad is the pain?"  (Scale 1-10; or mild, moderate, severe)   - NO PAIN (0): no pain or only slight stiffness    - MILD (1-3): doesn't interfere with normal activities    - MODERATE (4-7): interferes with normal activities or awakens from sleep    - SEVERE (8-10):  excruciating pain, unable to do any normal activities      6 5. RADIATION: "Does the pain go anywhere else, shoot into your arms?"     denies 6. CORD SYMPTOMS: "Any weakness or numbness of the arms or legs?"     denies 7. CAUSE: "What do you think is causing the neck pain?"     Related to  being sick for the last week or so, allergies 8. NECK OVERUSE: "Any recent activities that involved turning or twisting the neck?"     denies 9. OTHER SYMPTOMS: "Do you have any other symptoms?" (e.g., headache, fever, chest pain, difficulty breathing, neck swelling)     denies  Protocols used: Neck Pain or Stiffness-A-AH

## 2023-10-03 NOTE — Telephone Encounter (Signed)
We usually do not prescribe antibiotics for neck pain.  Office visit with any provider if not better.  Thank you

## 2023-10-04 NOTE — Telephone Encounter (Signed)
Spoke with the pt and she has stated she is feeling a lot better and is no longer in pain.  I offered pt a sooner visit she has stated she has one n 10/09/22 and is thinking of canceling as she is much better.

## 2023-10-07 IMAGING — MG MM DIGITAL SCREENING BILAT W/ TOMO AND CAD
8 series · 8 of 24 positions shown · non-contrast
Comparison: Previous exam(s).

CLINICAL DATA: Screening.

EXAM:
DIGITAL SCREENING BILATERAL MAMMOGRAM WITH TOMOSYNTHESIS AND CAD
TECHNIQUE: Bilateral screening digital craniocaudal and mediolateral oblique
mammograms were obtained. Bilateral screening digital breast
tomosynthesis was performed. The images were evaluated with
computer-aided detection.

[L MLO synth-2D]
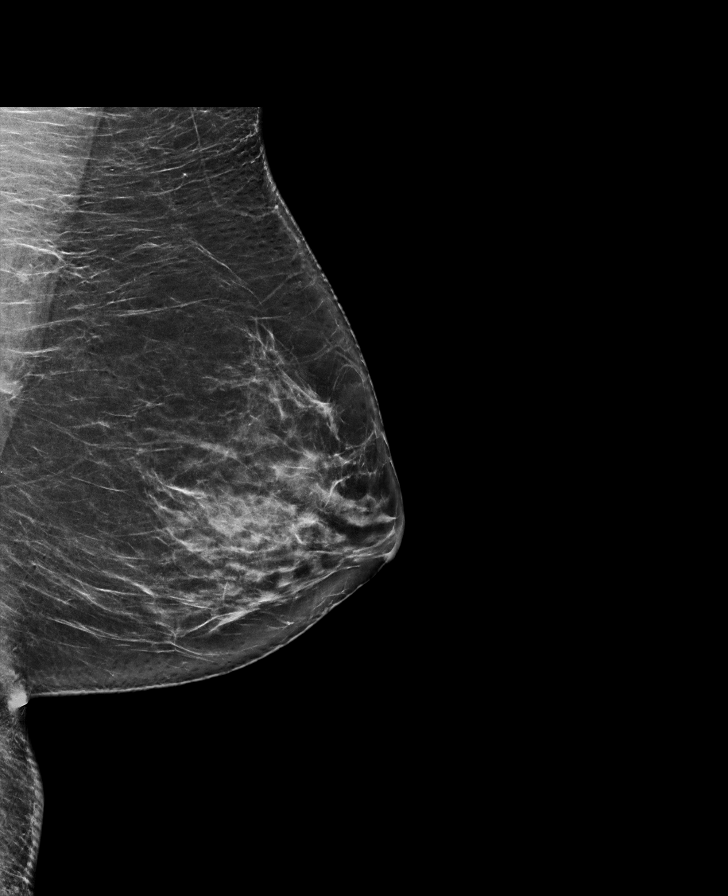

[R MLO synth-2D]
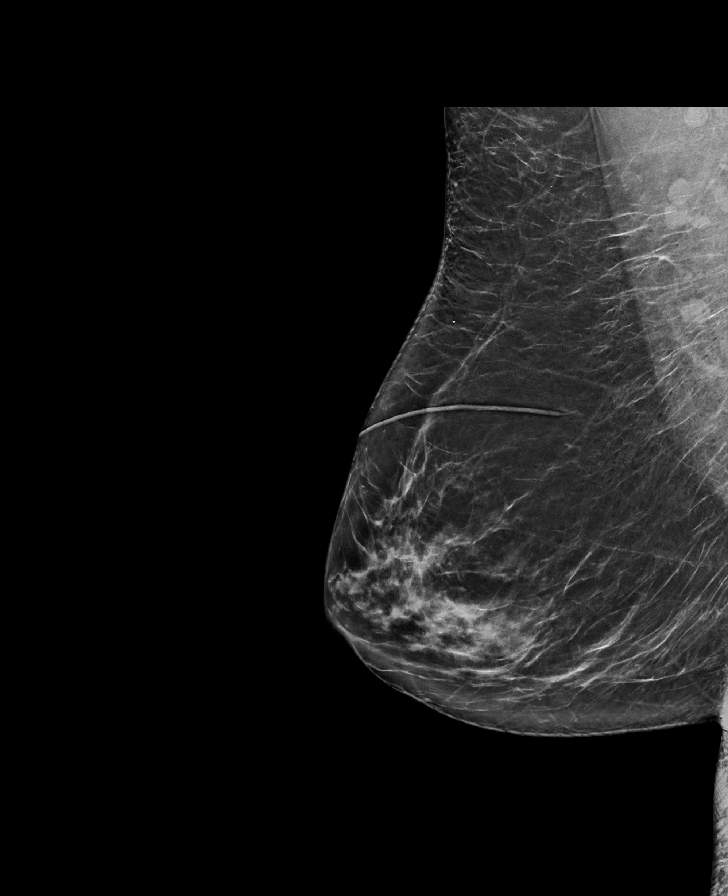

[L CC synth-2D]
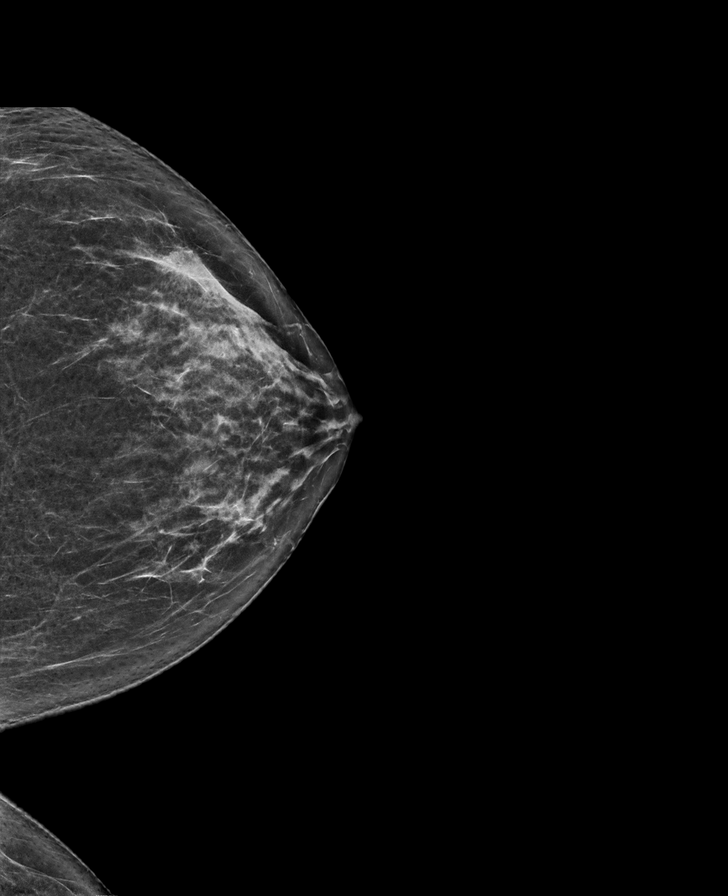

[R CC synth-2D]
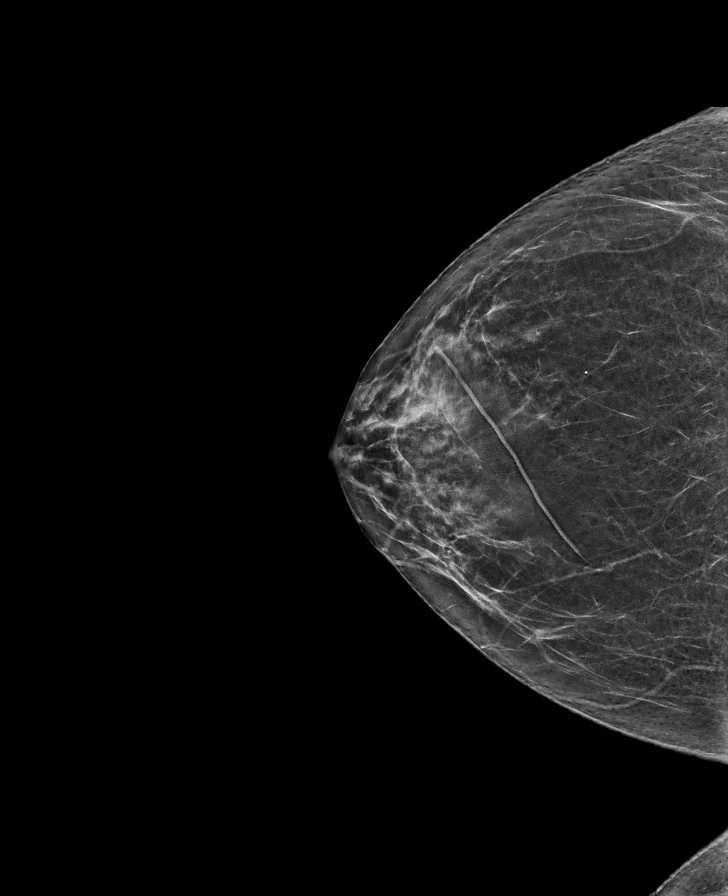

[L CC tomo · tomo slice 33/64.0]
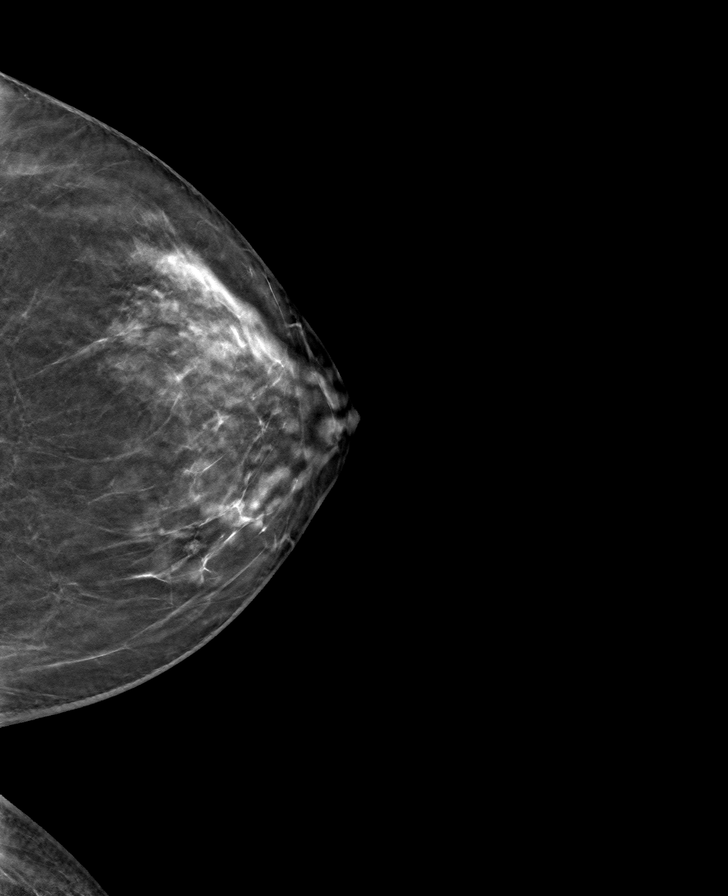

[L MLO tomo · tomo slice 38/75.0]
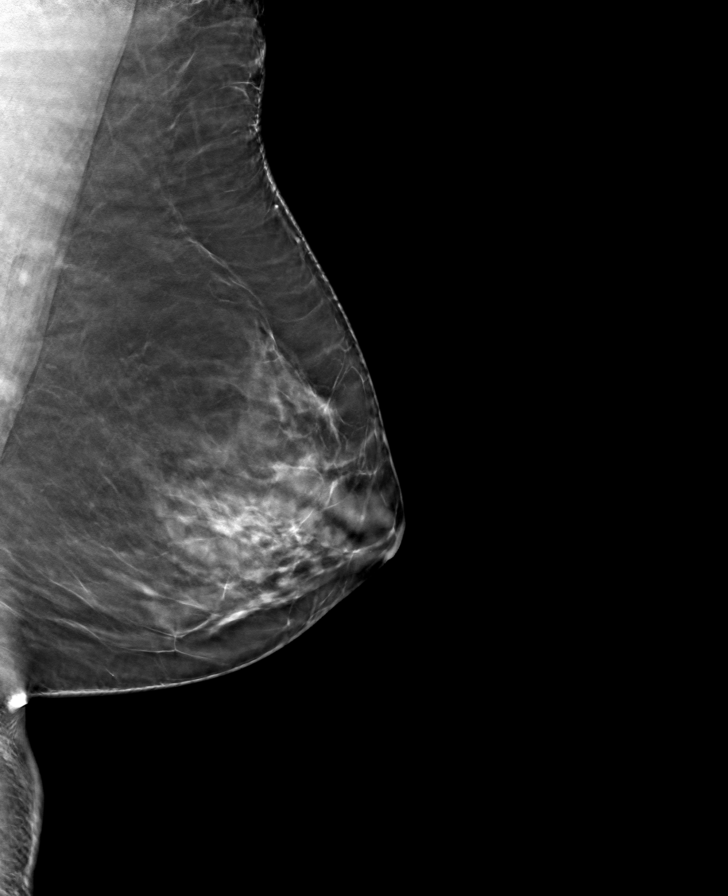

[R CC tomo · tomo slice 32/63.0]
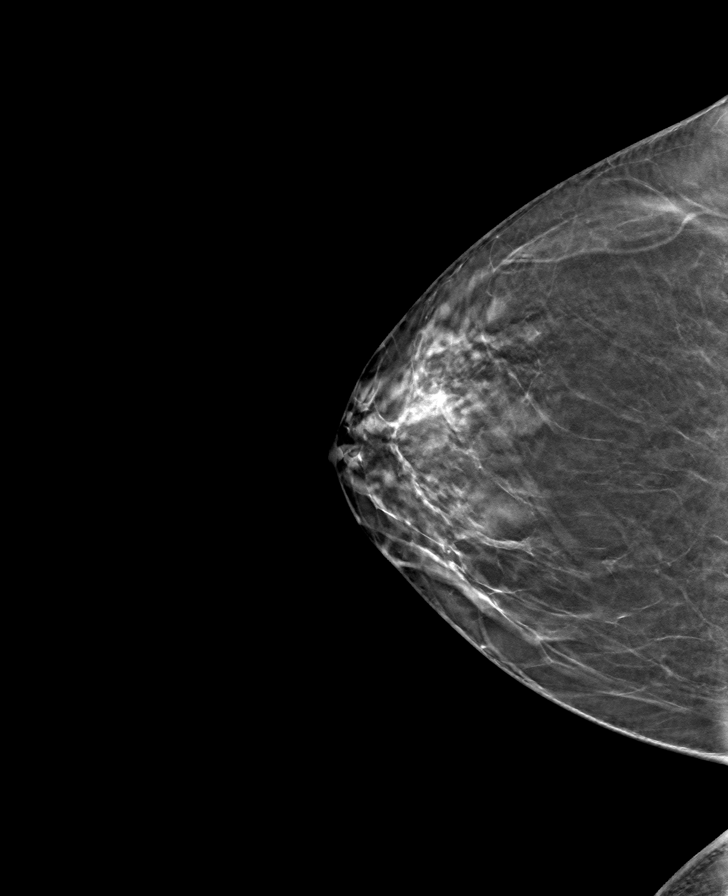

[R MLO tomo · tomo slice 39/77.0]
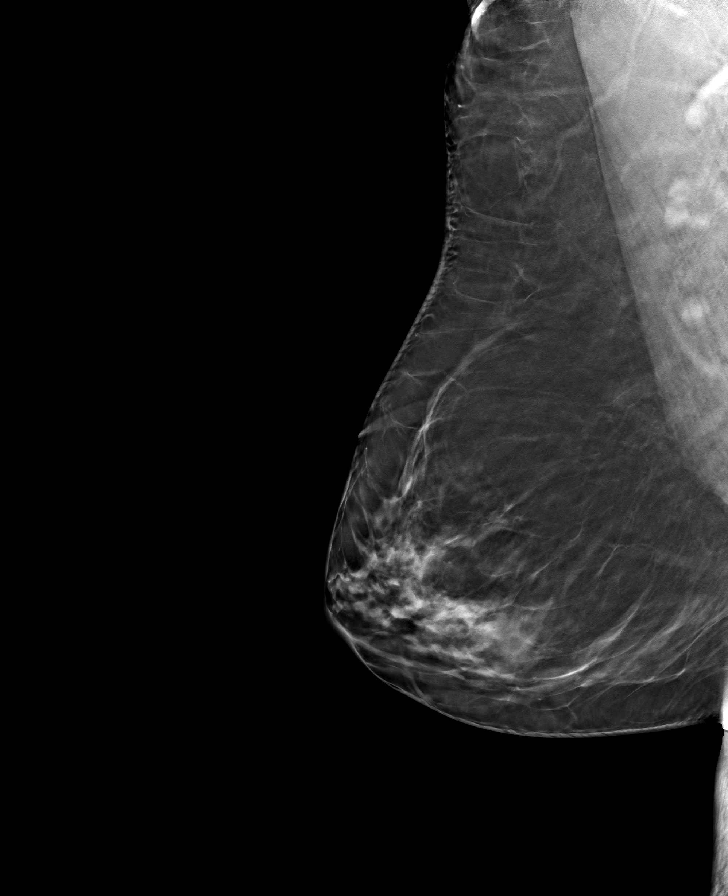

[8 of 24 positions shown; findings below may reference images not displayed]

ACR Breast Density Category b: There are scattered areas of
fibroglandular density.
FINDINGS: There are no findings suspicious for malignancy.
IMPRESSION: No mammographic evidence of malignancy. A result letter of this
screening mammogram will be mailed directly to the patient.

RECOMMENDATION:
Screening mammogram in one year. (Code:51-O-LD2)

BI-RADS CATEGORY  1: Negative.

## 2023-10-10 ENCOUNTER — Ambulatory Visit: Payer: Medicare Other | Admitting: Family Medicine

## 2023-10-10 NOTE — Progress Notes (Deleted)
   Acute Office Visit  Subjective:     Patient ID: Tina Bird, female    DOB: 1951/04/22, 73 y.o.   MRN: [id number]  No chief complaint on file.   HPI Patient is in today for evaluation of ***, for the last ***. Has tried Denies known sick contacts, Denies abdominal pain, nausea, vomiting, diarrhea, rash, fever, chills, other symptoms.  Medical hx as outlined below.  ROS Per HPI      Objective:    There were no vitals taken for this visit.   Physical Exam Vitals and nursing note reviewed.  HENT:     Head: Normocephalic and atraumatic.     Nose: No congestion.     Mouth/Throat:     Mouth: Mucous membranes are moist.     Pharynx: Oropharynx is clear. No oropharyngeal exudate or posterior oropharyngeal erythema.  Eyes:     Extraocular Movements: Extraocular movements intact.  Cardiovascular:     Rate and Rhythm: Normal rate and regular rhythm.  Pulmonary:     Effort: Pulmonary effort is normal. No respiratory distress.  Musculoskeletal:     Cervical back: Normal range of motion and neck supple.  Lymphadenopathy:     Cervical: No cervical adenopathy.  Skin:    General: Skin is warm and dry.  Neurological:     Mental Status: She is alert.   No results found for any visits on 10/10/23.      Assessment & Plan:  ***  No orders of the defined types were placed in this encounter.   No follow-ups on file.  Corean Ku, FNP

## 2023-10-13 NOTE — Patient Instructions (Addendum)
       Medications changes include :   Augmentin twice daily for one week   Keep taking your allergy medication.       If you are still having symptoms after the antibiotic please let me know so we can evaluate further.

## 2023-10-13 NOTE — Progress Notes (Signed)
 Subjective:    Patient ID: Tina Bird, female    DOB: 1951-05-29, 73 y.o.   MRN: [id number]      HPI Mahdiya is here for  Chief Complaint  Patient presents with   Swollen left gland    Swollen left gland at times that comes and goes     Her symptoms started a little before christmas.  A gland in the left side of her neck just under the jaw has been swollen and tender.  It is not painful, but there is some discomfort.  She has a similar gland on the right side, but the left side has been much more prominent and persistent.  She only occasionally feels the right gland.  Initially she thought it was allergies.  She does take an allergy medication daily.  It has been persistent which is why she is concerned.  She has taken Advil  and Tylenol  on occasion and that does help.   She does have some mild nasal congestion, sneezing, dry cough, headaches and pressure in her head-she is not sure if this is sinuses or just the headache and some lightheadedness/dizziness at times.  She denies any fevers, chills, sweats, change in appetite or unexpected weight loss.  She denies shortness of breath or wheezing.   Medications and allergies reviewed with patient and updated if appropriate.  Current Outpatient Medications on File Prior to Visit  Medication Sig Dispense Refill   acetaminophen  (TYLENOL ) 500 MG tablet Take 500 mg by mouth every 6 (six) hours as needed.     ALPRAZolam  (XANAX ) 0.25 MG tablet Take 1 tablet (0.25 mg total) by mouth at bedtime as needed for sleep. 30 tablet 1   aspirin  81 MG chewable tablet Chew 81 mg by mouth daily.     B Complex Vitamins (B COMPLEX-B12 PO) Take 1 tablet by mouth daily.     escitalopram  (LEXAPRO ) 5 MG tablet TAKE 1 TABLET(5 MG) BY MOUTH DAILY 90 tablet 1   estradiol  (CLIMARA ) 0.0375 mg/24hr patch Place 1 patch (0.0375 mg total) onto the skin once a week. 4 patch 5   famotidine  (PEPCID ) 40 MG tablet Take 1 tablet (40 mg total) by mouth daily.  qHS 90 tablet 3   fexofenadine-pseudoephedrine (ALLEGRA-D 24) 180-240 MG 24 hr tablet Take 1 tablet by mouth daily.     fluticasone  (FLONASE ) 50 MCG/ACT nasal spray Place 2 sprays into both nostrils daily. 16 g 6   loratadine  (CLARITIN ) 10 MG tablet Take 10 mg by mouth daily.     olmesartan -hydrochlorothiazide (BENICAR  HCT) 20-12.5 MG tablet Take 1 tablet by mouth daily. 90 tablet 3   pantoprazole  (PROTONIX ) 40 MG tablet Take 1 tablet (40 mg total) by mouth 2 (two) times daily. 60 tablet 11   progesterone  (PROMETRIUM ) 100 MG capsule Take 1 capsule (100 mg total) by mouth daily. 30 capsule 5   Cholecalciferol 1000 UNITS tablet Take 1,000 Units by mouth daily. (Patient not taking: Reported on 10/14/2023)     No current facility-administered medications on file prior to visit.    Review of Systems  Constitutional:  Negative for appetite change, chills, diaphoresis, fever and unexpected weight change.  HENT:  Positive for congestion (mild), sinus pressure (pressure in head - ? sinuses) and sneezing. Negative for ear pain (clogged sensation, achy), mouth sores, postnasal drip, sore throat and trouble swallowing.        Dry mouth  Respiratory:  Positive for cough (dry mostly). Negative for shortness of breath and wheezing.  Neurological:  Positive for dizziness, light-headedness and headaches (more than usual, pressure).       Objective:   Vitals:   10/14/23 1003  BP: 138/82  Pulse: 72  Temp: 98 F (36.7 C)  SpO2: 97%   BP Readings from Last 3 Encounters:  10/14/23 138/82  09/14/23 (!) 148/78  07/28/23 130/78   Wt Readings from Last 3 Encounters:  10/14/23 159 lb (72.1 kg)  09/14/23 158 lb 9.6 oz (71.9 kg)  07/28/23 161 lb (73 kg)   Body mass index is 26.46 kg/m.    Physical Exam Constitutional:      General: She is not in acute distress.    Appearance: Normal appearance. She is not ill-appearing.  HENT:     Head: Normocephalic and atraumatic.     Right Ear: Tympanic  membrane, ear canal and external ear normal.     Left Ear: Tympanic membrane, ear canal and external ear normal.     Mouth/Throat:     Mouth: Mucous membranes are moist.     Pharynx: No oropharyngeal exudate or posterior oropharyngeal erythema.  Eyes:     Conjunctiva/sclera: Conjunctivae normal.  Cardiovascular:     Rate and Rhythm: Normal rate and regular rhythm.  Pulmonary:     Effort: Pulmonary effort is normal. No respiratory distress.     Breath sounds: Normal breath sounds. No wheezing or rales.  Musculoskeletal:     Cervical back: Neck supple. No tenderness.  Lymphadenopathy:     Cervical: No cervical adenopathy.  Skin:    General: Skin is warm and dry.  Neurological:     Mental Status: She is alert.            Assessment & Plan:    Lump in neck-left side: Acute For the past couple of weeks has noticed swelling and mild discomfort and a palpable lymph node or gland in the left side of her neck Has been persistent Has similar symptoms on the right, but to much lesser degree Has some mild allergy/cold symptoms Taking an allergy medication daily ?  Swollen lymph node that is reactive or pathological versus submandibular gland ?  Mild URI/sinus infection Start Augmentin  875-125 mg twice daily x 7 days Continue allergy medication If swelling and discomfort persists she will let me know and we will pursue imaging or ENT referral   Hypertension: Chronic Blood pressure well-controlled here today Continue Benicar  HCT 20-12.5 mg daily

## 2023-10-14 ENCOUNTER — Ambulatory Visit (INDEPENDENT_AMBULATORY_CARE_PROVIDER_SITE_OTHER): Payer: Medicare Other | Admitting: Internal Medicine

## 2023-10-14 ENCOUNTER — Encounter: Payer: Self-pay | Admitting: Internal Medicine

## 2023-10-14 VITALS — BP 138/82 | HR 72 | Temp 98.0°F | Ht 65.0 in | Wt 159.0 lb

## 2023-10-14 DIAGNOSIS — I10 Essential (primary) hypertension: Secondary | ICD-10-CM | POA: Diagnosis not present

## 2023-10-14 DIAGNOSIS — R221 Localized swelling, mass and lump, neck: Secondary | ICD-10-CM

## 2023-10-14 MED ORDER — AMOXICILLIN-POT CLAVULANATE 875-125 MG PO TABS: 1.0000 | ORAL_TABLET | Freq: Two times a day (BID) | ORAL | 0 refills | Status: AC

## 2023-11-28 ENCOUNTER — Ambulatory Visit: Payer: Medicare Other | Admitting: Internal Medicine

## 2023-11-29 ENCOUNTER — Ambulatory Visit: Payer: Medicare Other | Admitting: Internal Medicine

## 2023-12-02 ENCOUNTER — Other Ambulatory Visit: Payer: Self-pay | Admitting: Internal Medicine

## 2023-12-07 ENCOUNTER — Ambulatory Visit: Payer: Medicare Other | Admitting: Internal Medicine

## 2024-02-23 ENCOUNTER — Encounter: Payer: Self-pay | Admitting: Internal Medicine

## 2024-02-23 ENCOUNTER — Ambulatory Visit (INDEPENDENT_AMBULATORY_CARE_PROVIDER_SITE_OTHER): Admitting: Internal Medicine

## 2024-02-23 VITALS — BP 110/72 | HR 65 | Temp 98.1°F | Ht 65.0 in | Wt 157.0 lb

## 2024-02-23 DIAGNOSIS — G4700 Insomnia, unspecified: Secondary | ICD-10-CM | POA: Diagnosis not present

## 2024-02-23 DIAGNOSIS — J4520 Mild intermittent asthma, uncomplicated: Secondary | ICD-10-CM | POA: Diagnosis not present

## 2024-02-23 DIAGNOSIS — I10 Essential (primary) hypertension: Secondary | ICD-10-CM

## 2024-02-23 DIAGNOSIS — N951 Menopausal and female climacteric states: Secondary | ICD-10-CM

## 2024-02-23 NOTE — Progress Notes (Signed)
 Subjective:  Patient ID: Tina Bird, female    DOB: [birth year]  Age: 73 y.o. MRN: [id number]  CC: Medical Management of Chronic Issues   HPI Tina Bird presents for HTN, hot flashes, anxiety  Outpatient Medications Prior to Visit  Medication Sig Dispense Refill   acetaminophen  (TYLENOL ) 500 MG tablet Take 500 mg by mouth every 6 (six) hours as needed.     ALPRAZolam  (XANAX ) 0.25 MG tablet Take 1 tablet (0.25 mg total) by mouth at bedtime as needed for sleep. 30 tablet 1   aspirin  81 MG chewable tablet Chew 81 mg by mouth daily.     B Complex Vitamins (B COMPLEX-B12 PO) Take 1 tablet by mouth daily.     escitalopram  (LEXAPRO ) 5 MG tablet TAKE 1 TABLET(5 MG) BY MOUTH DAILY 90 tablet 1   estradiol  (CLIMARA ) 0.0375 mg/24hr patch Place 1 patch (0.0375 mg total) onto the skin once a week. 4 patch 5   famotidine  (PEPCID ) 40 MG tablet Take 1 tablet (40 mg total) by mouth daily. qHS 90 tablet 3   fexofenadine-pseudoephedrine (ALLEGRA-D 24) 180-240 MG 24 hr tablet Take 1 tablet by mouth daily.     fluticasone  (FLONASE ) 50 MCG/ACT nasal spray Place 2 sprays into both nostrils daily. 16 g 6   loratadine  (CLARITIN ) 10 MG tablet Take 10 mg by mouth daily.     olmesartan -hydrochlorothiazide (BENICAR  HCT) 20-12.5 MG tablet Take 1 tablet by mouth daily. 90 tablet 3   pantoprazole  (PROTONIX ) 40 MG tablet Take 1 tablet (40 mg total) by mouth 2 (two) times daily. 60 tablet 11   progesterone  (PROMETRIUM ) 100 MG capsule Take 1 capsule (100 mg total) by mouth daily. 30 capsule 5   Cholecalciferol 1000 UNITS tablet Take 1,000 Units by mouth daily. (Patient not taking: Reported on 10/14/2023)     No facility-administered medications prior to visit.    ROS: Review of Systems  Constitutional:  Negative for activity change, appetite change, chills, fatigue and unexpected weight change.  HENT:  Negative for congestion, mouth sores and sinus pressure.   Eyes:  Negative for visual  disturbance.  Respiratory:  Negative for cough and chest tightness.   Gastrointestinal:  Negative for abdominal pain and nausea.  Genitourinary:  Negative for difficulty urinating, frequency and vaginal pain.  Musculoskeletal:  Negative for back pain and gait problem.  Skin:  Negative for pallor and rash.  Neurological:  Negative for dizziness, tremors, weakness, numbness and headaches.  Psychiatric/Behavioral:  Negative for confusion, sleep disturbance and suicidal ideas. The patient is nervous/anxious.     Objective:  BP 110/72   Pulse 65   Temp 98.1 F (36.7 C) (Oral)   Ht 5\' 5"  (1.651 m)   Wt 157 lb (71.2 kg)   SpO2 95%   BMI 26.13 kg/m   BP Readings from Last 3 Encounters:  02/23/24 110/72  10/14/23 138/82  09/14/23 (!) 148/78    Wt Readings from Last 3 Encounters:  02/23/24 157 lb (71.2 kg)  10/14/23 159 lb (72.1 kg)  09/14/23 158 lb 9.6 oz (71.9 kg)    Physical Exam Constitutional:      General: She is not in acute distress.    Appearance: She is well-developed.  HENT:     Head: Normocephalic.     Right Ear: External ear normal.     Left Ear: External ear normal.     Nose: Nose normal.  Eyes:     General:        Right  eye: No discharge.        Left eye: No discharge.     Conjunctiva/sclera: Conjunctivae normal.     Pupils: Pupils are equal, round, and reactive to light.  Neck:     Thyroid : No thyromegaly.     Vascular: No JVD.     Trachea: No tracheal deviation.  Cardiovascular:     Rate and Rhythm: Normal rate and regular rhythm.     Heart sounds: Normal heart sounds.  Pulmonary:     Effort: No respiratory distress.     Breath sounds: No stridor. No wheezing.  Abdominal:     General: Bowel sounds are normal. There is no distension.     Palpations: Abdomen is soft. There is no mass.     Tenderness: There is no abdominal tenderness. There is no guarding or rebound.  Musculoskeletal:        General: No tenderness.     Cervical back: Normal range of  motion and neck supple. No rigidity.  Lymphadenopathy:     Cervical: No cervical adenopathy.  Skin:    Findings: No erythema or rash.  Neurological:     Cranial Nerves: No cranial nerve deficit.     Motor: No abnormal muscle tone.     Coordination: Coordination normal.     Deep Tendon Reflexes: Reflexes normal.  Psychiatric:        Behavior: Behavior normal.        Thought Content: Thought content normal.        Judgment: Judgment normal.     Lab Results  Component Value Date   WBC 5.7 06/30/2023   HGB 13.2 06/30/2023   HCT 39.2 06/30/2023   PLT 263.0 06/30/2023   GLUCOSE 88 06/30/2023   CHOL 228 (H) 10/19/2022   TRIG 273.0 (H) 10/19/2022   HDL 39.00 (L) 10/19/2022   LDLDIRECT 150.0 10/19/2022   LDLCALC 139 (H) 05/01/2020   ALT 21 06/30/2023   AST 22 06/30/2023   NA 138 06/30/2023   K 4.0 06/30/2023   CL 98 06/30/2023   CREATININE 0.96 06/30/2023   BUN 19 06/30/2023   CO2 33 (H) 06/30/2023   TSH 1.60 06/30/2023   HGBA1C 6.0 08/20/2021    MM 3D SCREEN BREAST BILATERAL Result Date: 08/05/2021 CLINICAL DATA:  Screening. EXAM: DIGITAL SCREENING BILATERAL MAMMOGRAM WITH TOMOSYNTHESIS AND CAD TECHNIQUE: Bilateral screening digital craniocaudal and mediolateral oblique mammograms were obtained. Bilateral screening digital breast tomosynthesis was performed. The images were evaluated with computer-aided detection. COMPARISON:  Previous exam(s). ACR Breast Density Category b: There are scattered areas of fibroglandular density. FINDINGS: There are no findings suspicious for malignancy. IMPRESSION: No mammographic evidence of malignancy. A result letter of this screening mammogram will be mailed directly to the patient. RECOMMENDATION: Screening mammogram in one year. (Code:SM-B-01Y) BI-RADS CATEGORY  1: Negative. Electronically Signed   By: Dobrinka  Dimitrova M.D.   On: 08/05/2021 16:31    Assessment & Plan:   Problem List Items Addressed This Visit     MENOPAUSAL SYNDROME -  Primary   Worse Start Climara  patch, Progesterone  100 mg/d  Potential benefits of a long term HRTuse as well as potential risks (BPH, MI, OSA, cancer)  and complications were explained to the patient and were aknowledged.      Asthmatic bronchitis   Doing well      Essential hypertension, benign   No orthostatic BP drop off Maxzide Elevated BP. Orthostatic sx's w/o any BP drop registered On Micardis  20 mg/d - increase to  20 mg bid       Insomnia   Xanax  at hs prn insomnia Cont on Lexapro          No orders of the defined types were placed in this encounter.     Follow-up: Return in about 3 months (around 05/25/2024) for a follow-up visit.  Anitra Barn, MD

## 2024-02-23 NOTE — Assessment & Plan Note (Signed)
No orthostatic BP drop off Maxzide Elevated BP. Orthostatic sx's w/o any BP drop registered On Micardis 20 mg/d - increase to 20 mg bid

## 2024-02-23 NOTE — Assessment & Plan Note (Addendum)
 Worse Start Climara  patch, Progesterone  100 mg/d  Potential benefits of a long term HRTuse as well as potential risks (BPH, MI, OSA, cancer)  and complications were explained to the patient and were aknowledged. Mammogram q12 mo

## 2024-02-23 NOTE — Patient Instructions (Signed)
 [place] Heated Throw Blanket 50"  60", Electric Blanket [place] Faux Fur & Warm [place], 4h Auto-Off Timer& 6 Heating Levels, Reversible Heated Throw, ETL Certified, [employer], [employer]

## 2024-02-23 NOTE — Addendum Note (Signed)
 Addended by: Makilah Dowda V on: 02/23/2024 10:58 AM   Modules accepted: Level of Service

## 2024-02-23 NOTE — Assessment & Plan Note (Signed)
Xanax at hs prn insomnia Cont on Lexapro

## 2024-02-23 NOTE — Assessment & Plan Note (Signed)
 Doing well

## 2024-02-25 ENCOUNTER — Other Ambulatory Visit: Payer: Self-pay | Admitting: Internal Medicine

## 2024-04-13 DIAGNOSIS — H0102A Squamous blepharitis right eye, upper and lower eyelids: Secondary | ICD-10-CM | POA: Diagnosis not present

## 2024-04-13 DIAGNOSIS — H0102B Squamous blepharitis left eye, upper and lower eyelids: Secondary | ICD-10-CM | POA: Diagnosis not present

## 2024-04-13 DIAGNOSIS — Z961 Presence of intraocular lens: Secondary | ICD-10-CM | POA: Diagnosis not present

## 2024-04-13 DIAGNOSIS — H00025 Hordeolum internum left lower eyelid: Secondary | ICD-10-CM | POA: Diagnosis not present

## 2024-04-18 ENCOUNTER — Ambulatory Visit: Payer: Medicare Other

## 2024-04-18 VITALS — Ht 65.0 in | Wt 157.0 lb

## 2024-04-18 DIAGNOSIS — Z0000 Encounter for general adult medical examination without abnormal findings: Secondary | ICD-10-CM

## 2024-04-18 DIAGNOSIS — Z1231 Encounter for screening mammogram for malignant neoplasm of breast: Secondary | ICD-10-CM | POA: Diagnosis not present

## 2024-04-18 NOTE — Patient Instructions (Signed)
 Tina Bird , Thank you for taking time out of your busy schedule to complete your Annual Wellness Visit with me. I enjoyed our conversation and look forward to speaking with you again next year. I, as well as your care team,  appreciate your ongoing commitment to your health goals. Please review the following plan we discussed and let me know if I can assist you in the future. Your Game plan/ To Do List    Referrals: If you haven't heard from the office you've been referred to, please reach out to them at the phone provided.  [facility] for a Mammogram Follow up Visits: Next Medicare AWV with our clinical staff: 04/25/2025   Have you seen your provider in the last 6 months (3 months if uncontrolled diabetes)? Yes Next Office Visit with your provider: to be scheduled   Clinician Recommendations:  Aim for 30 minutes of exercise or brisk walking, 6-8 glasses of water, and 5 servings of fruits and vegetables each day.       This is a list of the screening recommended for you and due dates:  Health Maintenance  Topic Date Due   Mammogram  08/05/2022   COVID-19 Vaccine (8 - 2024-25 season) 06/05/2023   Flu Shot  05/04/2024   Medicare Annual Wellness Visit  04/18/2025   DTaP/Tdap/Td vaccine (3 - Td or Tdap) 05/01/2030   Pneumococcal Vaccine for age over 24  Completed   DEXA scan (bone density measurement)  Completed   Hepatitis C Screening  Completed   Zoster (Shingles) Vaccine  Completed   Hepatitis B Vaccine  Aged Out   HPV Vaccine  Aged Out   Meningitis B Vaccine  Aged Out   Colon Cancer Screening  Discontinued    Advanced directives: (Copy Requested) Please bring a copy of your health care power of attorney and living will to the office to be added to your chart at your convenience. You can mail to [facility] [address] [place], [place] [address] or email to [email or web address] Advance Care Planning is important because it:  [x]   Makes sure you receive the medical care that is consistent with your values, goals, and preferences  [x]  It provides guidance to your family and loved ones and reduces their decisional burden about whether or not they are making the right decisions based on your wishes.  Follow the link provided in your after visit summary or read over the paperwork we have mailed to you to help you started getting your Advance Directives in place. If you need assistance in completing these, please reach out to us  so that we can help you!

## 2024-04-18 NOTE — Progress Notes (Cosign Needed Addendum)
 Subjective:   Tina Bird is a 73 y.o. who presents for a Medicare Wellness preventive visit.  As a reminder, Annual Wellness Visits don't include a physical exam, and some assessments may be limited, especially if this visit is performed virtually. We may recommend an in-person follow-up visit with your provider if needed.  Visit Complete: Virtual I connected with  Tina Bird on 04/18/24 by a audio enabled telemedicine application and verified that I am speaking with the correct person using two identifiers.  Patient Location: Home  Provider Location: Office/Clinic  I discussed the limitations of evaluation and management by telemedicine. The patient expressed understanding and agreed to proceed.  Vital Signs: Because this visit was a virtual/telehealth visit, some criteria may be missing or patient reported. Any vitals not documented were not able to be obtained and vitals that have been documented are patient reported.  VideoDeclined- This patient declined Librarian, academic. Therefore the visit was completed with audio only.  Persons Participating in Visit: Patient.  AWV Questionnaire: No: Patient Medicare AWV questionnaire was not completed prior to this visit.  Cardiac Risk Factors include: advanced age (>36men, >23 women);dyslipidemia;hypertension     Objective:    Today's Vitals   04/18/24 0858  Weight: 157 lb (71.2 kg)  Height: 5' 5 (1.651 m)   Body mass index is 26.13 kg/m.     04/18/2024    8:58 AM 04/18/2023    9:19 AM 04/16/2022    8:14 AM 02/19/2021   11:31 AM 11/23/2018    9:45 AM 08/09/2018    1:04 PM 04/14/2017    9:41 AM  Advanced Directives  Does Patient Have a Medical Advance Directive? Yes Yes Yes No No  No  No   Type of Estate agent of [place];Living will Healthcare Power of [place];Living will Healthcare Power of [place];Living will      Copy of Healthcare Power of Attorney in  Chart? No - copy requested No - copy requested No - copy requested      Would patient like information on creating a medical advance directive?    No - Patient declined  Yes (ED - Information included in AVS)  No - Patient declined      Data saved with a previous flowsheet row definition    Current Medications (verified) Outpatient Encounter Medications as of 04/18/2024  Medication Sig   acetaminophen  (TYLENOL ) 500 MG tablet Take 500 mg by mouth every 6 (six) hours as needed.   ALPRAZolam  (XANAX ) 0.25 MG tablet Take 1 tablet (0.25 mg total) by mouth at bedtime as needed for sleep.   aspirin  81 MG chewable tablet Chew 81 mg by mouth daily.   B Complex Vitamins (B COMPLEX-B12 PO) Take 1 tablet by mouth daily.   escitalopram  (LEXAPRO ) 5 MG tablet TAKE 1 TABLET(5 MG) BY MOUTH DAILY   estradiol  (CLIMARA ) 0.0375 mg/24hr patch Place 1 patch (0.0375 mg total) onto the skin once a week.   famotidine  (PEPCID ) 40 MG tablet Take 1 tablet (40 mg total) by mouth daily. qHS   fexofenadine-pseudoephedrine (ALLEGRA-D 24) 180-240 MG 24 hr tablet Take 1 tablet by mouth daily.   fluticasone  (FLONASE ) 50 MCG/ACT nasal spray Place 2 sprays into both nostrils daily.   loratadine  (CLARITIN ) 10 MG tablet Take 10 mg by mouth daily.   olmesartan -hydrochlorothiazide (BENICAR  HCT) 20-12.5 MG tablet Take 1 tablet by mouth daily.   pantoprazole  (PROTONIX ) 40 MG tablet Take 1 tablet (40 mg total) by mouth 2 (  two) times daily.   progesterone  (PROMETRIUM ) 100 MG capsule TAKE 1 CAPSULE(100 MG) BY MOUTH DAILY   No facility-administered encounter medications on file as of 04/18/2024.    Allergies (verified) Poison ivy extract   History: Past Medical History:  Diagnosis Date   Asthmatic bronchitis 09/08/2012   03-07-12 bronchits x1   Cataract    Essential hypertension, benign 05/25/2016   GERD (gastroesophageal reflux disease)    LBP (low back pain)    Melanoma (HCC)    Right leg and Right breast   Menopausal symptoms     Dr. Kiki   Osteoarthritis 07/27/2011   left knee   Vertigo    Past Surgical History:  Procedure Laterality Date   BREAST EXCISIONAL BIOPSY Right    skin CA   BROW LIFT Bilateral 11/30/2018   Procedure: BLEPHAROPLASTY;  Surgeon: Lowery Estefana RAMAN, DO;  Location: [facility];  Service: Plastics;  Laterality: Bilateral;  90 min surgery time - per provider   CHOLECYSTECTOMY     laparoscopic   COLONOSCOPY  2011   DILATION AND CURETTAGE OF UTERUS     LAPAROSCOPIC APPENDECTOMY N/A 01/25/2016   Procedure: APPENDECTOMY LAPAROSCOPIC;  Surgeon: Jina Nephew, MD;  Location: [facility];  Service: General;  Laterality: N/A;   PEXY  09/11/2012   Procedure: PEXY;  Surgeon: Elspeth KYM Schultze, MD;  Location: [facility];  Service: General;  Laterality: N/A;   TONSILLECTOMY     TRANSANAL HEMORRHOIDAL DEARTERIALIZATION  09/11/2012   Procedure: TRANSANAL HEMORRHOIDAL DEARTERIALIZATION;  Surgeon: Elspeth KYM Schultze, MD;  Location: [facility];  Service: General;  Laterality: N/A;  THD hemorrhoidal ligation/pexy   TUBAL LIGATION     Family History  Problem Relation Age of Onset   Diabetes Mother    Hypertension Mother    Stroke Sister    Heart disease Brother    Hypertension Other    Alzheimer's disease Other    Colon cancer Neg Hx    Colon polyps Neg Hx    Esophageal cancer Neg Hx    Stomach cancer Neg Hx    Rectal cancer Neg Hx    Social History   Socioeconomic History   Marital status: Married    Spouse name: Not on file   Number of children: Not on file   Years of education: Not on file   Highest education level: Not on file  Occupational History   Not on file  Tobacco Use   Smoking status: Former    Current packs/day: 0.00    Average packs/day: 0.5 packs/day for 30.0 years (15.0 ttl pk-yrs)    Types: Cigarettes    Start date: 10/05/1971    Quit date: 10/04/2001    Years since quitting: 22.5   Smokeless tobacco: Never  Vaping Use   Vaping status: Never Used  Substance and Sexual  Activity   Alcohol use: Yes    Alcohol/week: 1.0 standard drink of alcohol    Types: 1 Glasses of wine per week    Comment: rare wine   Drug use: No   Sexual activity: Not Currently  Other Topics Concern   Not on file  Social History Narrative   Married   Social Drivers of Health   Financial Resource Strain: Low Risk  (04/18/2024)   Overall Financial Resource Strain (CARDIA)    Difficulty of Paying Living Expenses: Not hard at all  Food Insecurity: No Food Insecurity (04/18/2024)   Hunger Vital Sign    Worried About Programme researcher, broadcasting/film/video in  the Last Year: Never true    Ran Out of Food in the Last Year: Never true  Transportation Needs: No Transportation Needs (04/18/2024)   PRAPARE - Administrator, Civil Service (Medical): No    Lack of Transportation (Non-Medical): No  Physical Activity: Sufficiently Active (04/18/2024)   Exercise Vital Sign    Days of Exercise per Week: 5 days    Minutes of Exercise per Session: 30 min  Stress: No Stress Concern Present (04/18/2024)   [employer] of Occupational Health - Occupational Stress Questionnaire    Feeling of Stress: Not at all  Social Connections: Moderately Integrated (04/18/2024)   Social Connection and Isolation Panel    Frequency of Communication with Friends and Family: Three times a week    Frequency of Social Gatherings with Friends and Family: Three times a week    Attends Religious Services: More than 4 times per year    Active Member of Clubs or Organizations: No    Attends Banker Meetings: Never    Marital Status: Married    Tobacco Counseling Counseling given: No    Clinical Intake:  Pre-visit preparation completed: Yes  Pain : No/denies pain     BMI - recorded: 26.13 Nutritional Status: BMI 25 -29 Overweight Nutritional Risks: None Diabetes: No  Lab Results  Component Value Date   HGBA1C 6.0 08/20/2021   HGBA1C 6.0 01/02/2021   HGBA1C 6.0 12/07/2016     How often do  you need to have someone help you when you read instructions, pamphlets, or other written materials from your doctor or pharmacy?: 1 - Never  Interpreter Needed?: No  Information entered by :: Verdie Saba, CMA   Activities of Daily Living     04/18/2024    9:00 AM  In your present state of health, do you have any difficulty performing the following activities:  Hearing? 0  Vision? 0  Difficulty concentrating or making decisions? 0  Walking or climbing stairs? 0  Dressing or bathing? 0  Doing errands, shopping? 0  Preparing Food and eating ? N  Using the Toilet? N  In the past six months, have you accidently leaked urine? N  Do you have problems with loss of bowel control? N  Managing your Medications? N  Managing your Finances? N  Housekeeping or managing your Housekeeping? N    Patient Care Team: Plotnikov, Karlynn GAILS, MD as PCP - General Avram Lupita BRAVO, MD as Consulting Physician (Gastroenterology) Szabat, Toribio BROCKS, [facility] (Inactive) (Pharmacist) Szabat, Toribio BROCKS, [facility] (Inactive) as Pharmacist (Pharmacist) Octavia Bruckner, MD as Consulting Physician (Ophthalmology)  I have updated your Care Teams any recent Medical Services you may have received from other providers in the past year.     Assessment:   This is a routine wellness examination for Tina Bird.  Hearing/Vision screen Hearing Screening - Comments:: Denies hearing difficulties   Vision Screening - Comments:: Wears rx glasses - up to date with routine eye exams with Dr Octavia   Goals Addressed               This Visit's Progress     Patient Stated (pt-stated)        Patient stated that she plans to continue exercising and stay active.       Depression Screen     04/18/2024    9:02 AM 07/28/2023    9:10 AM 07/08/2023   10:58 AM 06/30/2023    9:42 AM 04/18/2023    9:22  AM 10/19/2022   10:14 AM 04/16/2022    8:15 AM  PHQ 2/9 Scores  PHQ - 2 Score 0 0 0 0 0 0 0  PHQ- 9 Score 0    0      Fall Risk      04/18/2024    9:01 AM 07/28/2023    9:09 AM 07/08/2023   10:58 AM 06/30/2023    9:42 AM 04/18/2023    9:21 AM  Fall Risk   Falls in the past year? 0  1 1 0  Number falls in past yr: 0 0 0 0 0  Injury with Fall? 0 0 0 0 0  Risk for fall due to : No Fall Risks History of fall(s) No Fall Risks History of fall(s) No Fall Risks  Follow up Falls evaluation completed;Falls prevention discussed Falls evaluation completed Falls evaluation completed Falls evaluation completed Falls prevention discussed    MEDICARE RISK AT HOME:  Medicare Risk at Home Any stairs in or around the home?: Yes If so, are there any without handrails?: No Home free of loose throw rugs in walkways, pet beds, electrical cords, etc?: Yes Adequate lighting in your home to reduce risk of falls?: Yes Life alert?: No Use of a cane, walker or w/c?: No Grab bars in the bathroom?: Yes Shower chair or bench in shower?: Yes Elevated toilet seat or a handicapped toilet?: Yes  TIMED UP AND GO:  Was the test performed?  No  Cognitive Function: 6CIT completed        04/18/2024    9:04 AM 04/18/2023    9:23 AM  6CIT Screen  What Year? 0 points 0 points  What month? 0 points 0 points  What time? 0 points 0 points  Count back from 20 0 points 0 points  Months in reverse 0 points 0 points  Repeat phrase 0 points 0 points  Total Score 0 points 0 points    Immunizations Immunization History  Administered Date(s) Administered   Fluad Quad(high Dose 65+) 07/13/2022   Influenza Split 07/27/2011, 08/29/2012   Influenza Whole 06/02/2010   Influenza, High Dose Seasonal PF 07/15/2016, 07/11/2017   Influenza,inj,Quad PF,6+ Mos 09/12/2013, 07/26/2014, 07/02/2015, 06/25/2018, 06/19/2019, 06/16/2021   Influenza-Unspecified 06/21/2020   Moderna SARS-COV2 Booster Vaccination 02/15/2021, 07/30/2022   Moderna Sars-Covid-2 Vaccination 11/19/2019, 12/18/2019, 07/27/2020, 02/15/2021, 06/30/2021   PNEUMOCOCCAL CONJUGATE-20 10/19/2022    Pfizer(Comirnaty)Fall Seasonal Vaccine 12 years and older 07/01/2022   Pneumococcal Conjugate-13 08/09/2018   Pneumococcal Polysaccharide-23 05/07/2016   Td 06/04/2009   Tdap 05/01/2020   Zoster Recombinant(Shingrix ) 05/05/2022   Zoster, Live 08/11/2011   Zoster, Unspecified 07/13/2022    Screening Tests Health Maintenance  Topic Date Due   MAMMOGRAM  08/05/2022   COVID-19 Vaccine (8 - 2024-25 season) 06/05/2023   INFLUENZA VACCINE  05/04/2024   Medicare Annual Wellness (AWV)  04/18/2025   DTaP/Tdap/Td (3 - Td or Tdap) 05/01/2030   Pneumococcal Vaccine: 50+ Years  Completed   DEXA SCAN  Completed   Hepatitis C Screening  Completed   Zoster Vaccines- Shingrix   Completed   Hepatitis B Vaccines  Aged Out   HPV VACCINES  Aged Out   Meningococcal B Vaccine  Aged Out   Colonoscopy  Discontinued    Health Maintenance  Health Maintenance Due  Topic Date Due   MAMMOGRAM  08/05/2022   COVID-19 Vaccine (8 - 2024-25 season) 06/05/2023   Health Maintenance Items Addressed: Mammogram ordered  Additional Screening:  Vision Screening: Recommended annual ophthalmology exams for  early detection of glaucoma and other disorders of the eye. Would you like a referral to an eye doctor? No  Patient stated she has seen Dr Octavia for an eye exam in 04/2024.  Dental Screening: Recommended annual dental exams for proper oral hygiene  Community Resource Referral / Chronic Care Management: CRR required this visit?  No   CCM required this visit?  No   Plan:    I have personally reviewed and noted the following in the patient's chart:   Medical and social history Use of alcohol, tobacco or illicit drugs  Current medications and supplements including opioid prescriptions. Patient is not currently taking opioid prescriptions. Functional ability and status Nutritional status Physical activity Advanced directives List of other physicians Hospitalizations, surgeries, and ER visits in  previous 12 months Vitals Screenings to include cognitive, depression, and falls Referrals and appointments  In addition, I have reviewed and discussed with patient certain preventive protocols, quality metrics, and best practice recommendations. A written personalized care plan for preventive services as well as general preventive health recommendations were provided to patient.   Verdie CHRISTELLA Saba, CMA   04/18/2024   After Visit Summary: (MyChart) Due to this being a telephonic visit, the after visit summary with patients personalized plan was offered to patient via MyChart   Notes: Nothing significant to report at this time.  Medical screening examination/treatment/procedure(s) were performed by non-physician practitioner and as supervising physician I was immediately available for consultation/collaboration.  I agree with above. Karlynn Noel, MD

## 2024-04-25 ENCOUNTER — Other Ambulatory Visit: Payer: Self-pay | Admitting: Internal Medicine

## 2024-05-17 ENCOUNTER — Ambulatory Visit
Admission: RE | Admit: 2024-05-17 | Discharge: 2024-05-17 | Disposition: A | Discharge status: Home / Self Care | Source: Ambulatory Visit | Attending: Internal Medicine | Admitting: Internal Medicine

## 2024-05-17 DIAGNOSIS — Z1231 Encounter for screening mammogram for malignant neoplasm of breast: Secondary | ICD-10-CM | POA: Diagnosis not present

## 2024-06-07 ENCOUNTER — Other Ambulatory Visit: Payer: Self-pay | Admitting: Internal Medicine

## 2024-06-07 NOTE — Telephone Encounter (Signed)
 Copied from CRM [id number]. Topic: Clinical - Medication Refill >> Jun 07, 2024 11:27 AM Vena HERO wrote: Medication: escitalopram  (LEXAPRO ) 5 MG tablet  Has the patient contacted their pharmacy? Yes (Agent: If no, request that the patient contact the pharmacy for the refill. If patient does not wish to contact the pharmacy document the reason why and proceed with request.) (Agent: If yes, when and what did the pharmacy advise?) No more refills, contact provider This is the patient's preferred pharmacy:  [id number] [place], [place] - [address] AT [facility] [address] [place] [address] Phone: [phone] Fax: [phone]  Is this the correct pharmacy for this prescription? Yes If no, delete pharmacy and type the correct one.   Has the prescription been filled recently? No  Is the patient out of the medication? Yes  Has the patient been seen for an appointment in the last year OR does the patient have an upcoming appointment? Yes  Can we respond through MyChart? Yes  Agent: Please be advised that Rx refills may take up to 3 business days. We ask that you follow-up with your pharmacy.

## 2024-07-16 ENCOUNTER — Telehealth: Payer: Self-pay

## 2024-07-16 DIAGNOSIS — D485 Neoplasm of uncertain behavior of skin: Secondary | ICD-10-CM

## 2024-07-16 NOTE — Telephone Encounter (Signed)
 Copied from CRM [id number]. Topic: Referral - Request for Referral >> Jul 16, 2024 10:47 AM Thersia BROCKS wrote: Did the patient discuss referral with their provider in the last year? Yes (If No - schedule appointment) (If Yes - send message)  Appointment offered? Yes  Type of order/referral and detailed reason for visit: Dermatologist , patient has a spot on chest that would like to look at   Preference of office, provider, location: No Preference   If referral order, have you been seen by this specialty before? No  (If Yes, this issue or another issue? When? Where?  Can we respond through MyChart? Yes

## 2024-07-17 ENCOUNTER — Ambulatory Visit

## 2024-07-17 DIAGNOSIS — Z23 Encounter for immunization: Secondary | ICD-10-CM

## 2024-07-19 ENCOUNTER — Other Ambulatory Visit: Payer: Self-pay | Admitting: Internal Medicine

## 2024-07-20 NOTE — Telephone Encounter (Signed)
 Okay. Thank you.

## 2024-07-21 ENCOUNTER — Telehealth: Payer: Self-pay | Admitting: Internal Medicine

## 2024-08-06 NOTE — Telephone Encounter (Signed)
 This RN attempted to contact patient to assess. No answer. LVM to call back. Will route to office for follow up due to chronic concern.  Copied from CRM [id number]. Topic: Clinical - Medication Question >> Aug 06, 2024  9:29 AM Willma R wrote: Reason for CRM:  Patient states she has had vertigo for decades and has been taking an over the counter Mezaline. States she has been having a hard time finding it and would like to know if a prescription can be called in. [facility] #[id number] [place], [place] - [address] AT [facility] [address] [place] [address] Phone: [phone] Fax: [phone]  Patient can be reached at [phone]

## 2024-08-09 ENCOUNTER — Other Ambulatory Visit: Payer: Self-pay

## 2024-08-09 MED ORDER — MECLIZINE HCL 25 MG PO TABS: 12.5000 mg | ORAL_TABLET | Freq: Three times a day (TID) | ORAL | 1 refills | Status: AC | PRN

## 2024-08-26 ENCOUNTER — Other Ambulatory Visit: Payer: Self-pay | Admitting: Internal Medicine

## 2024-10-19 ENCOUNTER — Ambulatory Visit: Admitting: Internal Medicine

## 2024-10-31 ENCOUNTER — Ambulatory Visit: Admitting: Internal Medicine

## 2024-10-31 ENCOUNTER — Encounter: Payer: Self-pay | Admitting: Internal Medicine

## 2024-10-31 VITALS — BP 138/84 | HR 87 | Temp 98.2°F | Ht 65.0 in | Wt 162.0 lb

## 2024-10-31 DIAGNOSIS — M545 Low back pain, unspecified: Secondary | ICD-10-CM | POA: Diagnosis not present

## 2024-10-31 DIAGNOSIS — G4700 Insomnia, unspecified: Secondary | ICD-10-CM | POA: Diagnosis not present

## 2024-10-31 DIAGNOSIS — Z0000 Encounter for general adult medical examination without abnormal findings: Secondary | ICD-10-CM

## 2024-10-31 DIAGNOSIS — K219 Gastro-esophageal reflux disease without esophagitis: Secondary | ICD-10-CM

## 2024-10-31 DIAGNOSIS — F419 Anxiety disorder, unspecified: Secondary | ICD-10-CM

## 2024-10-31 DIAGNOSIS — E669 Obesity, unspecified: Secondary | ICD-10-CM | POA: Insufficient documentation

## 2024-10-31 DIAGNOSIS — E6609 Other obesity due to excess calories: Secondary | ICD-10-CM | POA: Diagnosis not present

## 2024-10-31 DIAGNOSIS — R739 Hyperglycemia, unspecified: Secondary | ICD-10-CM

## 2024-10-31 DIAGNOSIS — I951 Orthostatic hypotension: Secondary | ICD-10-CM

## 2024-10-31 DIAGNOSIS — E66811 Obesity, class 1: Secondary | ICD-10-CM

## 2024-10-31 DIAGNOSIS — Z6830 Body mass index (BMI) 30.0-30.9, adult: Secondary | ICD-10-CM | POA: Diagnosis not present

## 2024-10-31 DIAGNOSIS — G8929 Other chronic pain: Secondary | ICD-10-CM | POA: Diagnosis not present

## 2024-10-31 LAB — CBC WITH DIFFERENTIAL/PLATELET
Basophils Absolute: 0.1 10*3/uL (ref 0.0–0.1)
Basophils Relative: 0.9 % (ref 0.0–3.0)
Eosinophils Absolute: 0.3 10*3/uL (ref 0.0–0.7)
Eosinophils Relative: 4.9 % (ref 0.0–5.0)
HCT: 37.4 % (ref 36.0–46.0)
Hemoglobin: 12.7 g/dL (ref 12.0–15.0)
Lymphocytes Relative: 33.8 % (ref 12.0–46.0)
Lymphs Abs: 2 10*3/uL (ref 0.7–4.0)
MCHC: 34 g/dL (ref 30.0–36.0)
MCV: 85.1 fl (ref 78.0–100.0)
Monocytes Absolute: 0.4 10*3/uL (ref 0.1–1.0)
Monocytes Relative: 7.1 % (ref 3.0–12.0)
Neutro Abs: 3.2 10*3/uL (ref 1.4–7.7)
Neutrophils Relative %: 53.3 % (ref 43.0–77.0)
Platelets: 244 10*3/uL (ref 150.0–400.0)
RBC: 4.4 Mil/uL (ref 3.87–5.11)
RDW: 13.6 % (ref 11.5–15.5)
WBC: 5.9 10*3/uL (ref 4.0–10.5)

## 2024-10-31 LAB — URINALYSIS, ROUTINE W REFLEX MICROSCOPIC
Bilirubin Urine: NEGATIVE
Hgb urine dipstick: NEGATIVE
Nitrite: NEGATIVE
Specific Gravity, Urine: 1.02 (ref 1.000–1.030)
Total Protein, Urine: NEGATIVE
Urine Glucose: NEGATIVE
Urobilinogen, UA: 0.2 (ref 0.0–1.0)
pH: 6 (ref 5.0–8.0)

## 2024-10-31 LAB — COMPREHENSIVE METABOLIC PANEL WITH GFR
ALT: 16 U/L (ref 3–35)
AST: 18 U/L (ref 5–37)
Albumin: 4.2 g/dL (ref 3.5–5.2)
Alkaline Phosphatase: 64 U/L (ref 39–117)
BUN: 17 mg/dL (ref 6–23)
CO2: 32 meq/L (ref 19–32)
Calcium: 9.2 mg/dL (ref 8.4–10.5)
Chloride: 99 meq/L (ref 96–112)
Creatinine, Ser: 0.86 mg/dL (ref 0.40–1.20)
GFR: 66.22 mL/min
Glucose, Bld: 97 mg/dL (ref 70–99)
Potassium: 4.1 meq/L (ref 3.5–5.1)
Sodium: 136 meq/L (ref 135–145)
Total Bilirubin: 0.3 mg/dL (ref 0.2–1.2)
Total Protein: 7.2 g/dL (ref 6.0–8.3)

## 2024-10-31 LAB — HEMOGLOBIN A1C: Hgb A1c MFr Bld: 6.1 % (ref 4.6–6.5)

## 2024-10-31 LAB — LIPID PANEL
Cholesterol: 208 mg/dL — ABNORMAL HIGH (ref 28–200)
HDL: 35.8 mg/dL — ABNORMAL LOW
LDL Cholesterol: 139 mg/dL — ABNORMAL HIGH (ref 10–99)
NonHDL: 171.78
Total CHOL/HDL Ratio: 6
Triglycerides: 165 mg/dL — ABNORMAL HIGH (ref 10.0–149.0)
VLDL: 33 mg/dL (ref 0.0–40.0)

## 2024-10-31 LAB — TSH: TSH: 2.03 u[IU]/mL (ref 0.35–5.50)

## 2024-10-31 MED ORDER — TIRZEPATIDE-WEIGHT MANAGEMENT 2.5 MG/0.5ML ~~LOC~~ SOLN: 2.5000 mg | SUBCUTANEOUS | 5 refills | Status: AC

## 2024-10-31 MED ORDER — TIRZEPATIDE-WEIGHT MANAGEMENT 2.5 MG/0.5ML ~~LOC~~ SOLN: 2.5000 mg | SUBCUTANEOUS | 5 refills | Status: DC

## 2024-10-31 NOTE — Assessment & Plan Note (Signed)
Xanax at hs prn insomnia Cont on Lexapro

## 2024-10-31 NOTE — Progress Notes (Signed)
 "  Subjective:  Patient ID: Tina Bird, female    DOB: Mar 24, 1951  Age: 74 y.o. MRN: [id number]  CC: Follow-up   HPI Tina Bird presents for obesity, elv glucose, anxiety. Well exam  Outpatient Medications Prior to Visit  Medication Sig Dispense Refill   acetaminophen  (TYLENOL ) 500 MG tablet Take 500 mg by mouth every 6 (six) hours as needed.     ALPRAZolam  (XANAX ) 0.25 MG tablet Take 1 tablet (0.25 mg total) by mouth at bedtime as needed for sleep. 30 tablet 1   aspirin  81 MG chewable tablet Chew 81 mg by mouth daily.     B Complex Vitamins (B COMPLEX-B12 PO) Take 1 tablet by mouth daily.     escitalopram  (LEXAPRO ) 5 MG tablet TAKE 1 TABLET(5 MG) BY MOUTH DAILY 90 tablet 1   estradiol  (CLIMARA ) 0.0375 mg/24hr patch Place 1 patch (0.0375 mg total) onto the skin once a week. 4 patch 5   famotidine  (PEPCID ) 40 MG tablet TAKE 1 TABLET(40 MG) BY MOUTH EVERY NIGHT AT BEDTIME 90 tablet 3   fexofenadine-pseudoephedrine (ALLEGRA-D 24) 180-240 MG 24 hr tablet Take 1 tablet by mouth daily.     fluticasone  (FLONASE ) 50 MCG/ACT nasal spray Place 2 sprays into both nostrils daily. 16 g 6   loratadine  (CLARITIN ) 10 MG tablet Take 10 mg by mouth daily.     meclizine  (ANTIVERT ) 25 MG tablet Take 0.5 tablets (12.5 mg total) by mouth 3 (three) times daily as needed. verti 30 tablet 1   olmesartan -hydrochlorothiazide (BENICAR  HCT) 20-12.5 MG tablet TAKE 1 TABLET BY MOUTH DAILY 90 tablet 3   pantoprazole  (PROTONIX ) 40 MG tablet TAKE 1 TABLET(40 MG) BY MOUTH TWICE DAILY 60 tablet 11   progesterone  (PROMETRIUM ) 100 MG capsule TAKE 1 CAPSULE(100 MG) BY MOUTH DAILY 30 capsule 5   No facility-administered medications prior to visit.    ROS: Review of Systems  Constitutional:  Positive for unexpected weight change. Negative for activity change, appetite change, chills and fatigue.  HENT:  Negative for congestion, mouth sores and sinus pressure.   Eyes:  Negative for visual disturbance.   Respiratory:  Negative for cough and chest tightness.   Gastrointestinal:  Negative for abdominal pain and nausea.  Genitourinary:  Negative for difficulty urinating, frequency and vaginal pain.  Musculoskeletal:  Negative for back pain and gait problem.  Skin:  Negative for pallor and rash.  Neurological:  Negative for dizziness, tremors, weakness, numbness and headaches.  Psychiatric/Behavioral:  Negative for confusion, sleep disturbance and suicidal ideas.     Objective:  BP 138/84   Pulse 87   Temp 98.2 F (36.8 C) (Oral)   Ht 5' 5 (1.651 m)   Wt 162 lb (73.5 kg)   SpO2 98%   BMI 26.96 kg/m   BP Readings from Last 3 Encounters:  10/31/24 138/84  02/23/24 110/72  10/14/23 138/82    Wt Readings from Last 3 Encounters:  10/31/24 162 lb (73.5 kg)  04/18/24 157 lb (71.2 kg)  02/23/24 157 lb (71.2 kg)    Physical Exam Constitutional:      General: She is not in acute distress.    Appearance: She is well-developed. She is obese.  HENT:     Head: Normocephalic.     Right Ear: External ear normal.     Left Ear: External ear normal.     Nose: Nose normal.  Eyes:     General:        Right eye: No discharge.  Left eye: No discharge.     Conjunctiva/sclera: Conjunctivae normal.     Pupils: Pupils are equal, round, and reactive to light.  Neck:     Thyroid : No thyromegaly.     Vascular: No JVD.     Trachea: No tracheal deviation.  Cardiovascular:     Rate and Rhythm: Normal rate and regular rhythm.     Heart sounds: Normal heart sounds.  Pulmonary:     Effort: No respiratory distress.     Breath sounds: No stridor. No wheezing.  Abdominal:     General: Bowel sounds are normal. There is no distension.     Palpations: Abdomen is soft. There is no mass.     Tenderness: There is no abdominal tenderness. There is no guarding or rebound.  Musculoskeletal:        General: No tenderness.     Cervical back: Normal range of motion and neck supple. No rigidity.      Right lower leg: No edema.     Left lower leg: No edema.  Lymphadenopathy:     Cervical: No cervical adenopathy.  Skin:    Findings: No erythema or rash.  Neurological:     Mental Status: She is oriented to person, place, and time.     Cranial Nerves: No cranial nerve deficit.     Motor: No abnormal muscle tone.     Coordination: Coordination normal.     Gait: Gait normal.     Deep Tendon Reflexes: Reflexes normal.  Psychiatric:        Behavior: Behavior normal.        Thought Content: Thought content normal.        Judgment: Judgment normal.     Lab Results  Component Value Date   WBC 5.7 06/30/2023   HGB 13.2 06/30/2023   HCT 39.2 06/30/2023   PLT 263.0 06/30/2023   GLUCOSE 88 06/30/2023   CHOL 228 (H) 10/19/2022   TRIG 273.0 (H) 10/19/2022   HDL 39.00 (L) 10/19/2022   LDLDIRECT 150.0 10/19/2022   LDLCALC 139 (H) 05/01/2020   ALT 21 06/30/2023   AST 22 06/30/2023   NA 138 06/30/2023   K 4.0 06/30/2023   CL 98 06/30/2023   CREATININE 0.96 06/30/2023   BUN 19 06/30/2023   CO2 33 (H) 06/30/2023   TSH 1.60 06/30/2023   HGBA1C 6.0 08/20/2021    MM 3D SCREENING MAMMOGRAM BILATERAL BREAST Result Date: 05/19/2024 CLINICAL DATA:  Screening. EXAM: MM DIGITAL SCREENING BILAT W/ TOMO AND CAD TECHNIQUE: Bilateral screening digital craniocaudal and mediolateral oblique mammograms were obtained. Bilateral screening digital breast tomosynthesis was performed. The images were evaluated with computer-aided detection. COMPARISON:  Previous exam(s). ACR Breast Density Category b: There are scattered areas of fibroglandular density. FINDINGS: There are no findings suspicious for malignancy. IMPRESSION: No mammographic evidence of malignancy. A result letter of this screening mammogram will be mailed directly to the patient. RECOMMENDATION: Screening mammogram in one year. (Code:SM-B-01Y) BI-RADS CATEGORY  1: Negative. Electronically Signed   By: Rosina Gelineau M.D.   On: 05/19/2024 08:27     Assessment & Plan:   Problem List Items Addressed This Visit     LOW BACK PAIN   L hip bursitis/IT band pain vs sciatica Meloxicam  Zanaflex  Pt declined steroids LS X ray Hip opener exercises      Well adult exam - Primary    We discussed age appropriate health related issues, including available/recomended screening tests and vaccinations. Labs were ordered to be  later reviewed . All questions were answered. We discussed one or more of the following - seat belt use, use of sunscreen/sun exposure exercise, fall risk reduction, second hand smoke exposure, firearm use and storage, seat belt use, a need for adhering to healthy diet and exercise. Labs were ordered.  All questions were answered. Colon 2011, 2023 - Dr Avram, due next  mammo q 1 year PAP - advised to schedule Singrix Rx Last colon 2023 Coronary calcium  CT score of 0. This was 0 percentile for age and sex matched control.      Relevant Orders   TSH   Urinalysis   CBC with Differential/Platelet   Lipid panel   Comprehensive metabolic panel with GFR   Hemoglobin A1c   GERD (gastroesophageal reflux disease)   Not better Use Protonix  BID, Pepcid  at hs prn  Potential benefits of a long term PPI use as well as potential risks  and complications were explained to the patient and were aknowledged. GI ref if not well       Anxiety disorder   Xanax  at hs prn insomnia Cont on Lexapro       Hyperglycemia   Check A1c, CMET      Relevant Orders   Hemoglobin A1c   Orthostatic hypotension   No relapse      Insomnia   Xanax  at hs prn insomnia Cont on Lexapro       Obesity   Relevant Medications   tirzepatide  (ZEPBOUND ) 2.5 MG/0.5ML injection vial      Meds ordered this encounter  Medications   DISCONTD: tirzepatide  (ZEPBOUND ) 2.5 MG/0.5ML injection vial    Sig: Inject 2.5 mg into the skin once a week.    Dispense:  2 mL    Refill:  5   tirzepatide  (ZEPBOUND ) 2.5 MG/0.5ML injection vial    Sig:  Inject 2.5 mg into the skin once a week.    Dispense:  2 mL    Refill:  5      Follow-up: Return in about 3 months (around 01/29/2025) for a follow-up visit.  Marolyn Noel, MD "

## 2024-10-31 NOTE — Assessment & Plan Note (Signed)
No relapse 

## 2024-10-31 NOTE — Assessment & Plan Note (Signed)
L hip bursitis/IT band pain vs sciatica Meloxicam Zanaflex Pt declined steroids LS X ray Hip opener exercises

## 2024-10-31 NOTE — Assessment & Plan Note (Signed)
Not better Use Protonix BID, Pepcid at hs prn  Potential benefits of a long term PPI use as well as potential risks  and complications were explained to the patient and were aknowledged. GI ref if not well

## 2024-10-31 NOTE — Assessment & Plan Note (Signed)
Check A1c, CMET 

## 2024-10-31 NOTE — Assessment & Plan Note (Signed)
" °  We discussed age appropriate health related issues, including available/recomended screening tests and vaccinations. Labs were ordered to be later reviewed . All questions were answered. We discussed one or more of the following - seat belt use, use of sunscreen/sun exposure exercise, fall risk reduction, second hand smoke exposure, firearm use and storage, seat belt use, a need for adhering to healthy diet and exercise. Labs were ordered.  All questions were answered. Colon 2011, 2023 - Dr Avram, due next  mammo q 1 year PAP - advised to schedule Singrix Rx Last colon 2023 Coronary calcium  CT score of 0. This was 0 percentile for age and sex matched control. "

## 2024-10-31 NOTE — Patient Instructions (Signed)
 [employer] has recently reduced the self-pay cash prices for Zepbound  single-dose vials purchased through the [employer] online pharmacy   The new prices for a four-week supply (four single-dose vials) for self-paying patients are:   2.5 mg dose: $299 per month (previously $349) 5 mg dose: $399 per month (previously $499) 7.5 mg, 10 mg, 12.5 mg, and 15 mg doses: $449 per month (previously $499) -----------------------------------------------------------------------------------------------------------------------------------------  [employer] Tzh[facility] pills Cash Pricing & Options (2026):  $149 per month: For the 1.5 mg and 4 mg doses (available through January 16, 2025). $199 per month: For the 4 mg dose starting January 17, 2025. $299 per month: For 9 mg and 25 mg doses. Provider Programs: Companies like [employer] and [employer] offer these cash prices directly to consumers, sometimes with additional telehealth fees. Savings Restrictions: These self-pay offers are generally not available to patients with government insurance like Medicare or Medicaid.  The Wegovy pill (taken daily) has a lower out-of-pocket, self-pay price compared to the [facility] injection (taken weekly), which costs $349+ per month for self-pay consumers.    Heres the typical dosing schedule for the [facility] pill:   Starting dosage: 1.5 mg once daily for the first four weeks. This starter dose helps your body get used to the medication and sets the stage for higher doses later on.  Step-up dosages: Every four weeks, the dose increased -- first to 4 mg, then 9 mg, and finally 25 mg. In clinical trials, most people reached the full dose after about three months of gradual increases.  Maintenance dosage: 25 mg once daily. This was the main dose used for ongoing weight loss in trials and is the maintenance dose approved by the [place]  Food and Drug Administration (FDA). If someone couldnt tolerate 25 mg in the trials, they stayed on a lower dose and tried  increasing again later with medical guidance.  Maximum dosage: 25 mg once daily. This is the highest dose approved by the FDA. After about 64 weeks, people taking 25 mg daily lost an average of 13.6% of their body weight.  Some studies explored even hi

## 2024-11-05 ENCOUNTER — Ambulatory Visit: Payer: Self-pay | Admitting: Internal Medicine

## 2025-01-30 ENCOUNTER — Ambulatory Visit: Admitting: Internal Medicine

## 2025-04-25 ENCOUNTER — Ambulatory Visit
